# Patient Record
Sex: Female | Born: 1975 | Race: White | Hispanic: No | Marital: Married | Smoking: Never smoker
Health system: Southern US, Community
[De-identification: ages and names within clinical notes are randomized; demographics above are authoritative.]

## PROBLEM LIST (undated history)

## (undated) DIAGNOSIS — Z0000 Encounter for general adult medical examination without abnormal findings: Principal | ICD-10-CM

## (undated) DIAGNOSIS — B019 Varicella without complication: Secondary | ICD-10-CM

## (undated) DIAGNOSIS — K219 Gastro-esophageal reflux disease without esophagitis: Secondary | ICD-10-CM

## (undated) DIAGNOSIS — R739 Hyperglycemia, unspecified: Secondary | ICD-10-CM

## (undated) DIAGNOSIS — T7840XA Allergy, unspecified, initial encounter: Secondary | ICD-10-CM

## (undated) DIAGNOSIS — J45909 Unspecified asthma, uncomplicated: Secondary | ICD-10-CM

## (undated) DIAGNOSIS — G4700 Insomnia, unspecified: Secondary | ICD-10-CM

## (undated) DIAGNOSIS — E669 Obesity, unspecified: Secondary | ICD-10-CM

## (undated) DIAGNOSIS — E282 Polycystic ovarian syndrome: Secondary | ICD-10-CM

## (undated) DIAGNOSIS — I10 Essential (primary) hypertension: Secondary | ICD-10-CM

## (undated) DIAGNOSIS — E782 Mixed hyperlipidemia: Secondary | ICD-10-CM

## (undated) HISTORY — DX: Obesity, unspecified: E66.9

## (undated) HISTORY — DX: Insomnia, unspecified: G47.00

## (undated) HISTORY — DX: Mixed hyperlipidemia: E78.2

## (undated) HISTORY — PX: KNEE SURGERY: SHX244

## (undated) HISTORY — DX: Allergy, unspecified, initial encounter: T78.40XA

## (undated) HISTORY — DX: Unspecified asthma, uncomplicated: J45.909

## (undated) HISTORY — DX: Hyperglycemia, unspecified: R73.9

## (undated) HISTORY — DX: Polycystic ovarian syndrome: E28.2

## (undated) HISTORY — DX: Encounter for general adult medical examination without abnormal findings: Z00.00

## (undated) HISTORY — DX: Gastro-esophageal reflux disease without esophagitis: K21.9

## (undated) HISTORY — DX: Essential (primary) hypertension: I10

## (undated) HISTORY — DX: Varicella without complication: B01.9

## (undated) HISTORY — PX: ABDOMINAL HYSTERECTOMY: SHX81

---

## 1998-01-31 ENCOUNTER — Other Ambulatory Visit: Admission: RE | Admit: 1998-01-31 | Discharge: 1998-01-31 | Payer: Self-pay | Admitting: *Deleted

## 1998-11-02 ENCOUNTER — Encounter: Payer: Self-pay | Admitting: Specialist

## 1998-11-02 ENCOUNTER — Ambulatory Visit (HOSPITAL_COMMUNITY): Admission: RE | Admit: 1998-11-02 | Discharge: 1998-11-02 | Payer: Self-pay | Admitting: Specialist

## 1999-02-18 ENCOUNTER — Other Ambulatory Visit: Admission: RE | Admit: 1999-02-18 | Discharge: 1999-02-18 | Payer: Self-pay | Admitting: *Deleted

## 1999-04-24 ENCOUNTER — Encounter: Admission: RE | Admit: 1999-04-24 | Discharge: 1999-07-23 | Payer: Self-pay | Admitting: Specialist

## 1999-09-09 HISTORY — PX: WISDOM TOOTH EXTRACTION: SHX21

## 1999-10-27 ENCOUNTER — Ambulatory Visit (HOSPITAL_COMMUNITY): Admission: RE | Admit: 1999-10-27 | Discharge: 1999-10-27 | Payer: Self-pay | Admitting: Rheumatology

## 1999-10-27 ENCOUNTER — Encounter: Payer: Self-pay | Admitting: Rheumatology

## 2000-02-03 ENCOUNTER — Other Ambulatory Visit: Admission: RE | Admit: 2000-02-03 | Discharge: 2000-02-03 | Payer: Self-pay | Admitting: *Deleted

## 2001-02-18 ENCOUNTER — Encounter (INDEPENDENT_AMBULATORY_CARE_PROVIDER_SITE_OTHER): Payer: Self-pay

## 2001-02-18 ENCOUNTER — Other Ambulatory Visit: Admission: RE | Admit: 2001-02-18 | Discharge: 2001-02-18 | Payer: Self-pay | Admitting: *Deleted

## 2001-05-14 ENCOUNTER — Encounter: Payer: Self-pay | Admitting: Internal Medicine

## 2001-06-29 ENCOUNTER — Other Ambulatory Visit: Admission: RE | Admit: 2001-06-29 | Discharge: 2001-06-29 | Payer: Self-pay | Admitting: *Deleted

## 2002-01-20 ENCOUNTER — Other Ambulatory Visit: Admission: RE | Admit: 2002-01-20 | Discharge: 2002-01-20 | Payer: Self-pay | Admitting: *Deleted

## 2003-05-08 ENCOUNTER — Other Ambulatory Visit: Admission: RE | Admit: 2003-05-08 | Discharge: 2003-05-08 | Payer: Self-pay | Admitting: *Deleted

## 2004-11-11 ENCOUNTER — Ambulatory Visit: Payer: Self-pay | Admitting: Internal Medicine

## 2005-06-17 ENCOUNTER — Other Ambulatory Visit: Admission: RE | Admit: 2005-06-17 | Discharge: 2005-06-17 | Payer: Self-pay | Admitting: Obstetrics and Gynecology

## 2006-02-20 ENCOUNTER — Ambulatory Visit: Payer: Self-pay | Admitting: Internal Medicine

## 2006-06-23 ENCOUNTER — Other Ambulatory Visit: Admission: RE | Admit: 2006-06-23 | Discharge: 2006-06-23 | Payer: Self-pay | Admitting: Obstetrics & Gynecology

## 2006-06-25 ENCOUNTER — Ambulatory Visit (HOSPITAL_COMMUNITY): Admission: RE | Admit: 2006-06-25 | Discharge: 2006-06-25 | Payer: Self-pay | Admitting: Specialist

## 2007-01-13 ENCOUNTER — Ambulatory Visit: Payer: Self-pay | Admitting: Internal Medicine

## 2007-08-04 ENCOUNTER — Other Ambulatory Visit: Admission: RE | Admit: 2007-08-04 | Discharge: 2007-08-04 | Payer: Self-pay | Admitting: Obstetrics and Gynecology

## 2007-10-22 DIAGNOSIS — N764 Abscess of vulva: Secondary | ICD-10-CM

## 2007-10-23 ENCOUNTER — Ambulatory Visit: Payer: Self-pay | Admitting: Family Medicine

## 2007-10-25 ENCOUNTER — Telehealth: Payer: Self-pay | Admitting: Family Medicine

## 2007-11-04 ENCOUNTER — Ambulatory Visit: Payer: Self-pay | Admitting: Internal Medicine

## 2007-11-04 DIAGNOSIS — F988 Other specified behavioral and emotional disorders with onset usually occurring in childhood and adolescence: Secondary | ICD-10-CM

## 2007-11-04 DIAGNOSIS — I495 Sick sinus syndrome: Secondary | ICD-10-CM | POA: Insufficient documentation

## 2007-11-15 ENCOUNTER — Telehealth (INDEPENDENT_AMBULATORY_CARE_PROVIDER_SITE_OTHER): Payer: Self-pay | Admitting: *Deleted

## 2007-11-16 ENCOUNTER — Encounter: Payer: Self-pay | Admitting: Internal Medicine

## 2008-10-03 ENCOUNTER — Telehealth: Payer: Self-pay | Admitting: Internal Medicine

## 2008-10-05 ENCOUNTER — Other Ambulatory Visit: Admission: RE | Admit: 2008-10-05 | Discharge: 2008-10-05 | Payer: Self-pay | Admitting: Obstetrics and Gynecology

## 2008-12-18 ENCOUNTER — Ambulatory Visit: Payer: Self-pay | Admitting: Family Medicine

## 2008-12-18 DIAGNOSIS — T7840XA Allergy, unspecified, initial encounter: Secondary | ICD-10-CM | POA: Insufficient documentation

## 2008-12-18 HISTORY — DX: Allergy, unspecified, initial encounter: T78.40XA

## 2009-04-03 ENCOUNTER — Ambulatory Visit: Payer: Self-pay | Admitting: Internal Medicine

## 2009-04-03 DIAGNOSIS — R55 Syncope and collapse: Secondary | ICD-10-CM

## 2009-04-03 DIAGNOSIS — R011 Cardiac murmur, unspecified: Secondary | ICD-10-CM

## 2009-04-03 DIAGNOSIS — R030 Elevated blood-pressure reading, without diagnosis of hypertension: Secondary | ICD-10-CM | POA: Insufficient documentation

## 2009-04-22 ENCOUNTER — Encounter: Payer: Self-pay | Admitting: Internal Medicine

## 2009-04-22 DIAGNOSIS — R42 Dizziness and giddiness: Secondary | ICD-10-CM

## 2009-04-22 DIAGNOSIS — Q078 Other specified congenital malformations of nervous system: Secondary | ICD-10-CM

## 2009-04-23 ENCOUNTER — Encounter: Payer: Self-pay | Admitting: Internal Medicine

## 2009-05-04 ENCOUNTER — Ambulatory Visit: Payer: Self-pay | Admitting: Internal Medicine

## 2009-05-04 DIAGNOSIS — Z9189 Other specified personal risk factors, not elsewhere classified: Secondary | ICD-10-CM | POA: Insufficient documentation

## 2009-05-16 ENCOUNTER — Encounter: Payer: Self-pay | Admitting: Internal Medicine

## 2009-05-16 ENCOUNTER — Ambulatory Visit: Payer: Self-pay

## 2009-05-21 ENCOUNTER — Telehealth: Payer: Self-pay | Admitting: Internal Medicine

## 2009-05-30 ENCOUNTER — Encounter: Payer: Self-pay | Admitting: Internal Medicine

## 2009-05-30 ENCOUNTER — Ambulatory Visit (HOSPITAL_BASED_OUTPATIENT_CLINIC_OR_DEPARTMENT_OTHER): Admission: RE | Admit: 2009-05-30 | Discharge: 2009-05-30 | Payer: Self-pay | Admitting: Internal Medicine

## 2009-06-03 ENCOUNTER — Ambulatory Visit: Payer: Self-pay | Admitting: Pulmonary Disease

## 2009-06-05 ENCOUNTER — Telehealth (INDEPENDENT_AMBULATORY_CARE_PROVIDER_SITE_OTHER): Payer: Self-pay | Admitting: *Deleted

## 2009-06-08 ENCOUNTER — Ambulatory Visit: Payer: Self-pay | Admitting: Internal Medicine

## 2009-06-09 LAB — CONVERTED CEMR LAB
ALT: 22 units/L (ref 0–35)
AST: 21 units/L (ref 0–37)
BUN: 17 mg/dL (ref 6–23)
CO2: 30 meq/L (ref 19–32)
Calcium: 9.2 mg/dL (ref 8.4–10.5)
Chloride: 106 meq/L (ref 96–112)
Creatinine, Ser: 0.7 mg/dL (ref 0.4–1.2)
GFR calc non Af Amer: 102.24 mL/min (ref >60)
Glucose, Bld: 90 mg/dL (ref 70–99)
Potassium: 4.2 meq/L (ref 3.5–5.1)
Sodium: 140 meq/L (ref 135–145)

## 2009-06-11 ENCOUNTER — Ambulatory Visit: Payer: Self-pay | Admitting: Internal Medicine

## 2009-06-11 DIAGNOSIS — R609 Edema, unspecified: Secondary | ICD-10-CM | POA: Insufficient documentation

## 2009-12-17 ENCOUNTER — Telehealth (INDEPENDENT_AMBULATORY_CARE_PROVIDER_SITE_OTHER): Payer: Self-pay | Admitting: *Deleted

## 2009-12-17 ENCOUNTER — Encounter: Payer: Self-pay | Admitting: Cardiovascular Disease

## 2010-06-28 ENCOUNTER — Ambulatory Visit: Payer: Self-pay | Admitting: Internal Medicine

## 2010-09-13 ENCOUNTER — Ambulatory Visit
Admission: RE | Admit: 2010-09-13 | Discharge: 2010-09-13 | Payer: Self-pay | Source: Home / Self Care | Attending: Internal Medicine | Admitting: Internal Medicine

## 2010-09-13 DIAGNOSIS — J45901 Unspecified asthma with (acute) exacerbation: Secondary | ICD-10-CM | POA: Insufficient documentation

## 2010-10-06 LAB — CONVERTED CEMR LAB
BUN: 12 mg/dL (ref 6–23)
Basophils Absolute: 0 10*3/uL (ref 0.0–0.1)
Basophils Relative: 0.7 % (ref 0.0–3.0)
CO2: 29 meq/L (ref 19–32)
Calcium: 9.5 mg/dL (ref 8.4–10.5)
Chloride: 104 meq/L (ref 96–112)
Creatinine, Ser: 0.7 mg/dL (ref 0.4–1.2)
Eosinophils Absolute: 0.2 10*3/uL (ref 0.0–0.7)
Eosinophils Relative: 3.2 % (ref 0.0–5.0)
GFR calc non Af Amer: 108.73 mL/min (ref >60)
Glucose, Bld: 82 mg/dL (ref 70–99)
HCT: 37.6 % (ref 36.0–46.0)
Hemoglobin: 13 g/dL (ref 12.0–15.0)
Lymphocytes Relative: 26.7 % (ref 12.0–46.0)
Lymphs Abs: 1.8 10*3/uL (ref 0.7–4.0)
MCHC: 34.6 g/dL (ref 30.0–36.0)
MCV: 85.9 fL (ref 78.0–100.0)
Monocytes Absolute: 0.5 10*3/uL (ref 0.1–1.0)
Monocytes Relative: 7.7 % (ref 3.0–12.0)
Neutro Abs: 4.1 10*3/uL (ref 1.4–7.7)
Neutrophils Relative %: 61.7 % (ref 43.0–77.0)
Platelets: 296 10*3/uL (ref 150.0–400.0)
Potassium: 4.5 meq/L (ref 3.5–5.1)
RBC: 4.37 M/uL (ref 3.87–5.11)
RDW: 13.2 % (ref 11.5–14.6)
Sodium: 139 meq/L (ref 135–145)
WBC: 6.7 10*3/uL (ref 4.5–10.5)

## 2010-10-08 NOTE — Medication Information (Signed)
Summary: Physician Orders  Physician Orders   Imported By: Roderic Ovens 12/27/2009 11:31:32  _____________________________________________________________________  External Attachment:    Type:   Image     Comment:   External Document

## 2010-10-08 NOTE — Progress Notes (Signed)
  Phone Note From Other Clinic   Caller: Provider Summary of Call: PT IN CHAIR AT DENTIST OFFICE WITH EMERGENCY  NEEDS NOTE SAYING WHETHER  PT NEEDS PRE MED PER NEW GUIDELINES NOT REQUIRED PER DR MCALHANY.FAXED NOTE TO DR Marchia Bond.  Initial call taken by: Scherrie Bateman, LPN,  December 17, 2009 2:46 PM

## 2010-10-08 NOTE — Assessment & Plan Note (Signed)
Summary: ROV./ GD   Visit Type:  Follow-up Primary Provider:  Dr.William Hopper   History of Present Illness: The patient presents today for routine electrophysiology followup. She reports doing very well since last being seen in our clinic. She has had no further syncope or presyncope.  She reports occasional dizziness which is mild with prolonged standing.  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  The patient denies symptoms of palpitations, chest pain, shortness of breath, orthopnea, PND, lower extremity edema,  or neurologic sequela. The patient is tolerating medications without difficulties and is otherwise without complaint today.   Current Medications (verified): 1)  Proventil Hfa 108 (90 Base) Mcg/act Aers (Albuterol Sulfate) .... Use 4 Times Daily As Needed  Allergies: 1)  ! Pcn 2)  ! * Influenza Vaccine 3)  ! Latex Exam Gloves (Disposable Gloves)  Past History:  Past Medical History: Reviewed history from 05/04/2009 and no changes required. Neurally mediated syncope/ dysautonomia Hypertension RHINITIS (ICD-477.9) Sinus arrhythmia DJD Asthma  Past Surgical History: Reviewed history from 11/04/2007 and no changes required. Arthroscopy X2  Social History: Reviewed history from 05/04/2009 and no changes required. Single.  Lives in [place] alone. Domestic Partner Never Smoked Alcohol use-rare Drug use-no Works as a Web designer at [employer].  She also works as a Agricultural engineer.  Review of Systems       All systems are reviewed and negative except as listed in the HPI.   Vital Signs:  Patient profile:   35 year old female Height:      58 inches Weight:      184 pounds BMI:     38.60 Pulse rate:   84 / minute Pulse (ortho):   87 / minute BP sitting:   144 / 90  (left arm) BP standing:   121 / 83  Vitals Entered By: Laurance Flatten CMA (June 28, 2010 9:55 AM)  Serial Vital  Signs/Assessments:  Time      Position  BP       Pulse  Resp  Temp     By           Lying RA  114/76   76                    Jewel Hardy CMA           Sitting   130/89   88                    Jewel Hardy CMA           Standing  121/83   87                    Jewel Hardy CMA           Standing  110/77   80                    Jewel Hardy CMA           Standing  123/82   78                    Jewel Hardy CMA  Comments: Dizziness during readings 2 and 3 By: Laurance Flatten CMA    Physical Exam  General:  overweight, NAD Head:  normocephalic and atraumatic Eyes:  PERRLA/EOM intact; conjunctiva and lids normal. Mouth:  Teeth, gums  and palate normal. Oral mucosa normal. Neck:  Neck supple, no JVD. No masses, thyromegaly or abnormal cervical nodes. Lungs:  Clear bilaterally to auscultation and percussion. Heart:  Non-displaced PMI, chest non-tender; regular rate and rhythm, S1, S2 without murmurs, rubs or gallops. Carotid upstroke normal, no bruit. Normal abdominal aortic size, no bruits. Femorals normal pulses, no bruits. Pedals normal pulses. No edema, no varicosities. Abdomen:  Bowel sounds positive; abdomen soft and non-tender without masses, organomegaly, or hernias noted. No hepatosplenomegaly. Msk:  Back normal, normal gait. Muscle strength and tone normal. Pulses:  pulses normal in all 4 extremities Extremities:  No clubbing or cyanosis. Neurologic:  Alert and oriented x 3.   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 If you don't receive a letter, please call our office to schedule the follow-up appointment. 2)  Your physician recommends that you return for lab work today

## 2010-10-10 NOTE — Assessment & Plan Note (Signed)
Summary: HEAD CONGESTION,COUGH/RH.........Veronica Stokes   Vital Signs:  Patient profile:   35 year old female Weight:      186 pounds BMI:     39.01 Temp:     98.1 degrees F oral Pulse rate:   88 / minute Resp:     15 per minute BP sitting:   122 / 86  (left arm) Cuff size:   large  Vitals Entered By: Shonna Chock CMA (September 13, 2010 2:11 PM) CC: 1.) Cough and congestion  2.) Examine eyes, URI symptoms   Primary Care Provider:  Dr.Simar Pothier  CC:  1.) Cough and congestion  2.) Examine eyes and URI symptoms.  History of Present Illness: Onset 09/04/2010 as discharge from OD & wheezing while in [place]. ZO:[id number] MDI , Zyrtec,topical antihistamine.OD redness  resolved in < 4 days.OS redness this am; it has  responded to eyedrops. The patient reports nasal congestion, semi  purulent nasal discharge, and productive cough, but denies sore throat and earache.  Associated symptoms include dyspnea and wheezing.  The patient denies fever.  The patient also reports headache.  The patient denies the following risk factors for Strep sinusitis: unilateral nasal discharge, tooth pain, and tender adenopathy.    Current Medications (verified): 1)  Proventil Hfa 108 (90 Base) Mcg/act Aers (Albuterol Sulfate) .... Use 4 Times Daily As Needed 2)  Imidazyl .... 2 Drops in Infected Eye Three Times A Day  Allergies: 1)  ! Pcn 2)  ! * Influenza Vaccine 3)  ! Latex Exam Gloves (Disposable Gloves)  Physical Exam  General:  in no acute distress; alert,appropriate and cooperative throughout examination Eyes:  No corneal or conjunctival inflammation noted.Scleritis OS. EOMI. Perrla.  Vision grossly normal. Ears:  External ear exam shows no significant lesions or deformities.  Otoscopic examination reveals clear canals, tympanic membranes are intact bilaterally without bulging, retraction, inflammation or discharge. Hearing is grossly normal bilaterally. Nose:  External nasal examination shows no deformity or  inflammation. Nasal mucosa are  dry & erythematous  without lesions or exudates. Mouth:  Oral mucosa and oropharynx without lesions or exudates.  Teeth in good repair. Lungs:  Normal respiratory effort, chest expands symmetrically. Lungs are clear to auscultation, no crackles or wheezes. Cervical Nodes:  No lymphadenopathy noted Axillary Nodes:  No palpable lymphadenopathy   Impression & Recommendations:  Problem # 1:  ASTHMA NOS W/ACUTE EXACERBATION (ICD-493.92)  The following medications were removed from the medication list:    Proventil Hfa 108 (90 Base) Mcg/act Aers (Albuterol sulfate) ..... Use 4 times daily as needed Her updated medication list for this problem includes:    Ventolin Hfa 108 (90 Base) Mcg/act Aers (Albuterol sulfate) .Veronica Stokes... 1-2 puffs every 4 hrs as needed    Symbicort 160-4.5 Mcg/act Aero (Budesonide-formoterol fumarate) .Veronica Stokes... 1-2 puffs every 12 hrs ; gargle & spit after use  Problem # 2:  BRONCHITIS-ACUTE (ICD-466.0)  The following medications were removed from the medication list:    Proventil Hfa 108 (90 Base) Mcg/act Aers (Albuterol sulfate) ..... Use 4 times daily as needed Her updated medication list for this problem includes:    Clarithromycin 500 Mg Xr24h-tab (Clarithromycin) .Veronica Stokes... 2 once daily with a meal    Ventolin Hfa 108 (90 Base) Mcg/act Aers (Albuterol sulfate) .Veronica Stokes... 1-2 puffs every 4 hrs as needed    Symbicort 160-4.5 Mcg/act Aero (Budesonide-formoterol fumarate) .Veronica Stokes... 1-2 puffs every 12 hrs ; gargle & spit after use  Problem # 3:  CONJUNCTIVITIS (ICD-372.30) extrinsic ; clinically no infectious  conjunctivitis  Complete Medication List: 1)  Imidazyl  .... 2 drops in infected eye three times a day 2)  Clarithromycin 500 Mg Xr24h-tab (Clarithromycin) .... 2 once daily with a meal 3)  Ventolin Hfa 108 (90 Base) Mcg/act Aers (Albuterol sulfate) .Veronica Stokes.. 1-2 puffs every 4 hrs as needed 4)  Symbicort 160-4.5 Mcg/act Aero (Budesonide-formoterol fumarate)  .Veronica Stokes.. 1-2 puffs every 12 hrs ; gargle & spit after use  Patient Instructions: 1)  Report Warning Signs as discussed (pain , pus , fever, loss of vision). Strict Hand Hygiene. Prescriptions: SYMBICORT 160-4.5 MCG/ACT AERO (BUDESONIDE-FORMOTEROL FUMARATE) 1-2 puffs every 12 hrs ; gargle & spit after use  #1 x 5   Entered and Authorized by:   Marga Melnick MD   Signed by:   Marga Melnick MD on 09/13/2010   Method used:   Print then Give to Patient   RxID:   [id number] VENTOLIN HFA 108 (90 BASE) MCG/ACT AERS (ALBUTEROL SULFATE) 1-2 puffs every 4 hrs as needed  #1 x 2   Entered and Authorized by:   Marga Melnick MD   Signed by:   Marga Melnick MD on 09/13/2010   Method used:   Print then Give to Patient   RxID:   [id number] CLARITHROMYCIN 500 MG XR24H-TAB (CLARITHROMYCIN) 2 once daily with a meal  #20 x 0   Entered and Authorized by:   Marga Melnick MD   Signed by:   Marga Melnick MD on 09/13/2010   Method used:   Electronically to        [facility]  [address] [phone]* (retail)       [address]       [place], [place]  [address]       Ph: [phone] or [phone]       Fax: [phone]   RxID:   [id number]    Orders Added: 1)  Est. Patient Level III [60630]

## 2011-01-24 NOTE — Op Note (Signed)
[name], [name]            ACCOUNT NO.:  [id number]   MEDICAL RECORD NO.:  [id number]          PATIENT TYPE:  AMB   LOCATION:  DAY                          FACILITY[facility]  [facility]   PHYSICIAN:  Jene Every, M.D.    DATE OF BIRTH:  07/22/1976   DATE OF PROCEDURE:  06/25/2006  DATE OF DISCHARGE:                                 OPERATIVE REPORT   PREOPERATIVE DIAGNOSIS:  Meniscal tear, left knee.   POSTOPERATIVE DIAGNOSIS:  Lateral meniscus tear, grade 2 chondromalacia of  the patella, pathologic plica, medial compartment.   PROCEDURE PERFORMED:  Left knee arthroscopy, chondroplasty of the patella,  partial lateral meniscectomy, and excision of pathologic plica medial  compartment.   ANESTHESIA:  General.   SURGEON:  Jene Every, M.D.   ASSISTANT:  None.   BRIEF HISTORY AND INDICATIONS:  35 year old with knee pain, refractory,  history of trauma, partial ACL tear, persistent medial pain, giving way,  prevention is indicated for diagnosis and treatment.  The risks and benefits  were discussed including bleeding, infection, no change in symptoms,  worsening of symptoms, need for repeat debridement in the future.   SURGICAL TECHNIQUE:  With the patient in the supine position, after the  induction of adequate of adequate general anesthesia and 1 gram Kefzol, the  left lower extremity was prepped and draped in the usual sterile fashion.  A  lateral parapatellar portal and superomedial parapatellar portal was  fashioned with a #11 blade.  Ingress cannula atraumatically placed.  Irrigant was utilized to insufflate the joint.  Under direct visualization,  a medial parapatellar portal was fashioned with a #11 blade after  localization with an 18 gauge needle sparing the medial meniscus.  Inspection revealed grade 2 3 chondromalacia of the apex of the patella.  There was normal tracking.  There was medial plica where the femoral condyle  appeared to be snapping pathologic.  I  introduced the shaver and utilized it  to perform a chondroplasty of the patella, excised the plica.  I examined  the medial compartment, there was no tear.  The femoral condyle and tibial  plateau were unremarkable.  The ACL appeared to be intact to the lateral  wall but slightly attenuated.  The anterior drawer was 1+ with a good end  point.  Examination of the lateral compartment revealed a radial tear of the  posterior horn of the medial meniscus.  A shaver was introduced and utilized  to perform a partial lateral meniscectomy to a stable base.  The chondral  surfaces are unremarkable.  The gutters are unremarkable.  The knee was  copiously lavaged.  I examined the remnants of both menisci, they were  stable to palpation without evidence of residual tear.  There was normal  patellofemoral tracking, no impingement of the soft tissue.  All  instrumentation was removed.  The portals were closed with 4-0 nylon simple  suture.  0.25% Marcaine  with epinephrine was infiltrated in the joint.  The wound was dressed  sterilely.  She was awakened without difficulty and transported to the  recovery room in satisfactory condition.  The patient tolerated  the  procedure well without complications.      Jene Every, M.D.  Electronically Signed     JB/MEDQ  D:  06/25/2006  T:  06/27/2006  Job:  [id number]

## 2011-04-09 DIAGNOSIS — E282 Polycystic ovarian syndrome: Secondary | ICD-10-CM

## 2011-04-09 HISTORY — DX: Polycystic ovarian syndrome: E28.2

## 2012-03-08 ENCOUNTER — Encounter: Payer: Self-pay | Admitting: Internal Medicine

## 2012-03-08 ENCOUNTER — Ambulatory Visit (INDEPENDENT_AMBULATORY_CARE_PROVIDER_SITE_OTHER): Payer: BC Managed Care – PPO | Admitting: Internal Medicine

## 2012-03-08 VITALS — BP 118/76 | HR 92 | Temp 98.2°F | Wt 191.8 lb

## 2012-03-08 DIAGNOSIS — L02419 Cutaneous abscess of limb, unspecified: Secondary | ICD-10-CM

## 2012-03-08 DIAGNOSIS — L03119 Cellulitis of unspecified part of limb: Secondary | ICD-10-CM

## 2012-03-08 MED ORDER — DOXYCYCLINE HYCLATE 100 MG PO TABS: 100.0000 mg | ORAL_TABLET | Freq: Two times a day (BID) | ORAL | Status: AC

## 2012-03-08 NOTE — Progress Notes (Signed)
  Subjective:    Patient ID: Veronica Stokes, female    DOB: [birth year], 36 y.o.   MRN: [id number]  HPI She noticed a "red spot" at the left lateral knee on 03/06/12. She had been working in yard and also had cleaned out sheds; she questioned possible spider bite  As of 6/30 the  redness increased in size and was associated with some clear serous drainage from the lesion.  The lesion has been associated with some itching and burning.  She's treated the lesion with alcohol cleansing and applications of Neosporin  She has a history of MRSA boils.    Review of Systems She's had no associated purulence, fever, chills, or sweats. She's had some nausea which he attributes to fertility treatments. She's had no associated abdominal pain or vomiting.      Objective:   Physical Exam she appears healthy and well-nourished in no acute distress  There is no lymphadenopathy about the neck or axilla  Bowel sounds are normal; there is no organomegaly, masses, tenderness to palpation  There is an area of 15 x 21 mm erythema of the left lateral knee. There is a central eschar without ulceration  Pedal pulses are intact without significant edema          Assessment & Plan:  #1 cellulitis at the area of probable mosquito bite. In the absence of frank ulceration, high fever, abdominal pain, nausea and vomiting; spider bite is unlikely.  Plan: Doxycycline to cover MRSA and treat cellulitis. She should stop the Neosporin as there is high risk of contact dermatitis

## 2012-03-08 NOTE — Patient Instructions (Addendum)
Dip gauze in  sterile saline and applied to the wound twice a day. Cover the wound with Telfa , non stick dressing  without any antibiotic ointment. The saline can be purchased at the drugstore or you can make your own .Boil cup of salt in a gallon of water. Store mixture  in a clean container.Report Warning  signs as discussed (red streaks, pus, fever, increasing pain). 

## 2012-04-08 LAB — HM PAP SMEAR

## 2012-08-04 ENCOUNTER — Encounter: Payer: Self-pay | Admitting: Family Medicine

## 2012-08-04 ENCOUNTER — Ambulatory Visit (INDEPENDENT_AMBULATORY_CARE_PROVIDER_SITE_OTHER): Payer: BC Managed Care – PPO | Admitting: Family Medicine

## 2012-08-04 VITALS — BP 145/87 | HR 79 | Temp 98.4°F | Ht 59.0 in | Wt 199.8 lb

## 2012-08-04 DIAGNOSIS — N764 Abscess of vulva: Secondary | ICD-10-CM

## 2012-08-04 DIAGNOSIS — I495 Sick sinus syndrome: Secondary | ICD-10-CM

## 2012-08-04 DIAGNOSIS — R030 Elevated blood-pressure reading, without diagnosis of hypertension: Secondary | ICD-10-CM

## 2012-08-04 DIAGNOSIS — E282 Polycystic ovarian syndrome: Secondary | ICD-10-CM

## 2012-08-04 DIAGNOSIS — Z23 Encounter for immunization: Secondary | ICD-10-CM

## 2012-08-04 DIAGNOSIS — J45909 Unspecified asthma, uncomplicated: Secondary | ICD-10-CM

## 2012-08-04 DIAGNOSIS — F988 Other specified behavioral and emotional disorders with onset usually occurring in childhood and adolescence: Secondary | ICD-10-CM

## 2012-08-04 DIAGNOSIS — R609 Edema, unspecified: Secondary | ICD-10-CM

## 2012-08-04 MED ORDER — ALBUTEROL SULFATE HFA 108 (90 BASE) MCG/ACT IN AERS: 2.0000 | INHALATION_SPRAY | Freq: Four times a day (QID) | RESPIRATORY_TRACT | Status: DC | PRN

## 2012-08-04 NOTE — Assessment & Plan Note (Addendum)
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 Failed IUI and IVF has not chosen try this. Tolerating Metformin

## 2012-08-04 NOTE — Assessment & Plan Note (Signed)
Previously on meds. Doing well without them.

## 2012-08-04 NOTE — Patient Instructions (Signed)
Preventive Care for Adults, Female A healthy lifestyle and preventive care can promote health and wellness. Preventive health guidelines for women include the following key practices.  A routine yearly physical is a good way to check with your caregiver about your health and preventive screening. It is a chance to share any concerns and updates on your health, and to receive a thorough exam.  Visit your dentist for a routine exam and preventive care every 6 months. Brush your teeth twice a day and floss once a day. Good oral hygiene prevents tooth decay and gum disease.  The frequency of eye exams is based on your age, health, family medical history, use of contact lenses, and other factors. Follow your caregiver's recommendations for frequency of eye exams.  Eat a healthy diet. Foods like vegetables, fruits, whole grains, low-fat dairy products, and lean protein foods contain the nutrients you need without too many calories. Decrease your intake of foods high in solid fats, added sugars, and salt. Eat the right amount of calories for you.Get information about a proper diet from your caregiver, if necessary.  Regular physical exercise is one of the most important things you can do for your health. Most adults should get at least 150 minutes of moderate-intensity exercise (any activity that increases your heart rate and causes you to sweat) each week. In addition, most adults need muscle-strengthening exercises on 2 or more days a week.  Maintain a healthy weight. The body mass index (BMI) is a screening tool to identify possible weight problems. It provides an estimate of body fat based on height and weight. Your caregiver can help determine your BMI, and can help you achieve or maintain a healthy weight.For adults 20 years and older:  A BMI below 18.5 is considered underweight.  A BMI of 18.5 to 24.9 is normal.  A BMI of 25 to 29.9 is considered overweight.  A BMI of 30 and above is  considered obese.  Maintain normal blood lipids and cholesterol levels by exercising and minimizing your intake of saturated fat. Eat a balanced diet with plenty of fruit and vegetables. Blood tests for lipids and cholesterol should begin at age 20 and be repeated every 5 years. If your lipid or cholesterol levels are high, you are over 50, or you are at high risk for heart disease, you may need your cholesterol levels checked more frequently.Ongoing high lipid and cholesterol levels should be treated with medicines if diet and exercise are not effective.  If you smoke, find out from your caregiver how to quit. If you do not use tobacco, do not start.  If you are pregnant, do not drink alcohol. If you are breastfeeding, be very cautious about drinking alcohol. If you are not pregnant and choose to drink alcohol, do not exceed 1 drink per day. One drink is considered to be 12 ounces (355 mL) of beer, 5 ounces (148 mL) of wine, or 1.5 ounces (44 mL) of liquor.  Avoid use of street drugs. Do not share needles with anyone. Ask for help if you need support or instructions about stopping the use of drugs.  High blood pressure causes heart disease and increases the risk of stroke. Your blood pressure should be checked at least every 1 to 2 years. Ongoing high blood pressure should be treated with medicines if weight loss and exercise are not effective.  If you are 55 to 36 years old, ask your caregiver if you should take aspirin to prevent strokes.  Diabetes   screening involves taking a blood sample to check your fasting blood sugar level. This should be done once every 3 years, after age 45, if you are within normal weight and without risk factors for diabetes. Testing should be considered at a younger age or be carried out more frequently if you are overweight and have at least 1 risk factor for diabetes.  Breast cancer screening is essential preventive care for women. You should practice "breast  self-awareness." This means understanding the normal appearance and feel of your breasts and may include breast self-examination. Any changes detected, no matter how small, should be reported to a caregiver. Women in their 20s and 30s should have a clinical breast exam (CBE) by a caregiver as part of a regular health exam every 1 to 3 years. After age 40, women should have a CBE every year. Starting at age 40, women should consider having a mammography (breast X-ray test) every year. Women who have a family history of breast cancer should talk to their caregiver about genetic screening. Women at a high risk of breast cancer should talk to their caregivers about having magnetic resonance imaging (MRI) and a mammography every year.  The Pap test is a screening test for cervical cancer. A Pap test can show cell changes on the cervix that might become cervical cancer if left untreated. A Pap test is a procedure in which cells are obtained and examined from the lower end of the uterus (cervix).  Women should have a Pap test starting at age 21.  Between ages 21 and 29, Pap tests should be repeated every 2 years.  Beginning at age 30, you should have a Pap test every 3 years as long as the past 3 Pap tests have been normal.  Some women have medical problems that increase the chance of getting cervical cancer. Talk to your caregiver about these problems. It is especially important to talk to your caregiver if a new problem develops soon after your last Pap test. In these cases, your caregiver may recommend more frequent screening and Pap tests.  The above recommendations are the same for women who have or have not gotten the vaccine for human papillomavirus (HPV).  If you had a hysterectomy for a problem that was not cancer or a condition that could lead to cancer, then you no longer need Pap tests. Even if you no longer need a Pap test, a regular exam is a good idea to make sure no other problems are  starting.  If you are between ages 65 and 70, and you have had normal Pap tests going back 10 years, you no longer need Pap tests. Even if you no longer need a Pap test, a regular exam is a good idea to make sure no other problems are starting.  If you have had past treatment for cervical cancer or a condition that could lead to cancer, you need Pap tests and screening for cancer for at least 20 years after your treatment.  If Pap tests have been discontinued, risk factors (such as a new sexual partner) need to be reassessed to determine if screening should be resumed.  The HPV test is an additional test that may be used for cervical cancer screening. The HPV test looks for the virus that can cause the cell changes on the cervix. The cells collected during the Pap test can be tested for HPV. The HPV test could be used to screen women aged 30 years and older, and should   be used in women of any age who have unclear Pap test results. After the age of 30, women should have HPV testing at the same frequency as a Pap test.  Colorectal cancer can be detected and often prevented. Most routine colorectal cancer screening begins at the age of 50 and continues through age 75. However, your caregiver may recommend screening at an earlier age if you have risk factors for colon cancer. On a yearly basis, your caregiver may provide home test kits to check for hidden blood in the stool. Use of a small camera at the end of a tube, to directly examine the colon (sigmoidoscopy or colonoscopy), can detect the earliest forms of colorectal cancer. Talk to your caregiver about this at age 50, when routine screening begins. Direct examination of the colon should be repeated every 5 to 10 years through age 75, unless early forms of pre-cancerous polyps or small growths are found.  Hepatitis C blood testing is recommended for all people born from 1945 through 1965 and any individual with known risks for hepatitis C.  Practice  safe sex. Use condoms and avoid high-risk sexual practices to reduce the spread of sexually transmitted infections (STIs). STIs include gonorrhea, chlamydia, syphilis, trichomonas, herpes, HPV, and human immunodeficiency virus (HIV). Herpes, HIV, and HPV are viral illnesses that have no cure. They can result in disability, cancer, and death. Sexually active women aged 25 and younger should be checked for chlamydia. Older women with new or multiple partners should also be tested for chlamydia. Testing for other STIs is recommended if you are sexually active and at increased risk.  Osteoporosis is a disease in which the bones lose minerals and strength with aging. This can result in serious bone fractures. The risk of osteoporosis can be identified using a bone density scan. Women ages 65 and over and women at risk for fractures or osteoporosis should discuss screening with their caregivers. Ask your caregiver whether you should take a calcium supplement or vitamin D to reduce the rate of osteoporosis.  Menopause can be associated with physical symptoms and risks. Hormone replacement therapy is available to decrease symptoms and risks. You should talk to your caregiver about whether hormone replacement therapy is right for you.  Use sunscreen with sun protection factor (SPF) of 30 or more. Apply sunscreen liberally and repeatedly throughout the day. You should seek shade when your shadow is shorter than you. Protect yourself by wearing long sleeves, pants, a wide-brimmed hat, and sunglasses year round, whenever you are outdoors.  Once a month, do a whole body skin exam, using a mirror to look at the skin on your back. Notify your caregiver of new moles, moles that have irregular borders, moles that are larger than a pencil eraser, or moles that have changed in shape or color.  Stay current with required immunizations.  Influenza. You need a dose every fall (or winter). The composition of the flu vaccine  changes each year, so being vaccinated once is not enough.  Pneumococcal polysaccharide. You need 1 to 2 doses if you smoke cigarettes or if you have certain chronic medical conditions. You need 1 dose at age 65 (or older) if you have never been vaccinated.  Tetanus, diphtheria, pertussis (Tdap, Td). Get 1 dose of Tdap vaccine if you are younger than age 65, are over 65 and have contact with an infant, are a healthcare worker, are pregnant, or simply want to be protected from whooping cough. After that, you need a Td   booster dose every 10 years. Consult your caregiver if you have not had at least 3 tetanus and diphtheria-containing shots sometime in your life or have a deep or dirty wound.  HPV. You need this vaccine if you are a woman age 26 or younger. The vaccine is given in 3 doses over 6 months.  Measles, mumps, rubella (MMR). You need at least 1 dose of MMR if you were born in 1957 or later. You may also need a second dose.  Meningococcal. If you are age 19 to 21 and a first-year college student living in a residence hall, or have one of several medical conditions, you need to get vaccinated against meningococcal disease. You may also need additional booster doses.  Zoster (shingles). If you are age 60 or older, you should get this vaccine.  Varicella (chickenpox). If you have never had chickenpox or you were vaccinated but received only 1 dose, talk to your caregiver to find out if you need this vaccine.  Hepatitis A. You need this vaccine if you have a specific risk factor for hepatitis A virus infection or you simply wish to be protected from this disease. The vaccine is usually given as 2 doses, 6 to 18 months apart.  Hepatitis B. You need this vaccine if you have a specific risk factor for hepatitis B virus infection or you simply wish to be protected from this disease. The vaccine is given in 3 doses, usually over 6 months. Preventive Services / Frequency Ages 19 to 39  Blood  pressure check.** / Every 1 to 2 years.  Lipid and cholesterol check.** / Every 5 years beginning at age 20.  Clinical breast exam.** / Every 3 years for women in their 20s and 30s.  Pap test.** / Every 2 years from ages 21 through 29. Every 3 years starting at age 30 through age 65 or 70 with a history of 3 consecutive normal Pap tests.  HPV screening.** / Every 3 years from ages 30 through ages 65 to 70 with a history of 3 consecutive normal Pap tests.  Hepatitis C blood test.** / For any individual with known risks for hepatitis C.  Skin self-exam. / Monthly.  Influenza immunization.** / Every year.  Pneumococcal polysaccharide immunization.** / 1 to 2 doses if you smoke cigarettes or if you have certain chronic medical conditions.  Tetanus, diphtheria, pertussis (Tdap, Td) immunization. / A one-time dose of Tdap vaccine. After that, you need a Td booster dose every 10 years.  HPV immunization. / 3 doses over 6 months, if you are 26 and younger.  Measles, mumps, rubella (MMR) immunization. / You need at least 1 dose of MMR if you were born in 1957 or later. You may also need a second dose.  Meningococcal immunization. / 1 dose if you are age 19 to 21 and a first-year college student living in a residence hall, or have one of several medical conditions, you need to get vaccinated against meningococcal disease. You may also need additional booster doses.  Varicella immunization.** / Consult your caregiver.  Hepatitis A immunization.** / Consult your caregiver. 2 doses, 6 to 18 months apart.  Hepatitis B immunization.** / Consult your caregiver. 3 doses usually over 6 months. Ages 40 to 64  Blood pressure check.** / Every 1 to 2 years.  Lipid and cholesterol check.** / Every 5 years beginning at age 20.  Clinical breast exam.** / Every year after age 40.  Mammogram.** / Every year beginning at age 40   and continuing for as long as you are in good health. Consult with your  caregiver.  Pap test.** / Every 3 years starting at age 30 through age 65 or 70 with a history of 3 consecutive normal Pap tests.  HPV screening.** / Every 3 years from ages 30 through ages 65 to 70 with a history of 3 consecutive normal Pap tests.  Fecal occult blood test (FOBT) of stool. / Every year beginning at age 50 and continuing until age 75. You may not need to do this test if you get a colonoscopy every 10 years.  Flexible sigmoidoscopy or colonoscopy.** / Every 5 years for a flexible sigmoidoscopy or every 10 years for a colonoscopy beginning at age 50 and continuing until age 75.  Hepatitis C blood test.** / For all people born from 1945 through 1965 and any individual with known risks for hepatitis C.  Skin self-exam. / Monthly.  Influenza immunization.** / Every year.  Pneumococcal polysaccharide immunization.** / 1 to 2 doses if you smoke cigarettes or if you have certain chronic medical conditions.  Tetanus, diphtheria, pertussis (Tdap, Td) immunization.** / A one-time dose of Tdap vaccine. After that, you need a Td booster dose every 10 years.  Measles, mumps, rubella (MMR) immunization. / You need at least 1 dose of MMR if you were born in 1957 or later. You may also need a second dose.  Varicella immunization.** / Consult your caregiver.  Meningococcal immunization.** / Consult your caregiver.  Hepatitis A immunization.** / Consult your caregiver. 2 doses, 6 to 18 months apart.  Hepatitis B immunization.** / Consult your caregiver. 3 doses, usually over 6 months. Ages 65 and over  Blood pressure check.** / Every 1 to 2 years.  Lipid and cholesterol check.** / Every 5 years beginning at age 20.  Clinical breast exam.** / Every year after age 40.  Mammogram.** / Every year beginning at age 40 and continuing for as long as you are in good health. Consult with your caregiver.  Pap test.** / Every 3 years starting at age 30 through age 65 or 70 with a 3  consecutive normal Pap tests. Testing can be stopped between 65 and 70 with 3 consecutive normal Pap tests and no abnormal Pap or HPV tests in the past 10 years.  HPV screening.** / Every 3 years from ages 30 through ages 65 or 70 with a history of 3 consecutive normal Pap tests. Testing can be stopped between 65 and 70 with 3 consecutive normal Pap tests and no abnormal Pap or HPV tests in the past 10 years.  Fecal occult blood test (FOBT) of stool. / Every year beginning at age 50 and continuing until age 75. You may not need to do this test if you get a colonoscopy every 10 years.  Flexible sigmoidoscopy or colonoscopy.** / Every 5 years for a flexible sigmoidoscopy or every 10 years for a colonoscopy beginning at age 50 and continuing until age 75.  Hepatitis C blood test.** / For all people born from 1945 through 1965 and any individual with known risks for hepatitis C.  Osteoporosis screening.** / A one-time screening for women ages 65 and over and women at risk for fractures or osteoporosis.  Skin self-exam. / Monthly.  Influenza immunization.** / Every year.  Pneumococcal polysaccharide immunization.** / 1 dose at age 65 (or older) if you have never been vaccinated.  Tetanus, diphtheria, pertussis (Tdap, Td) immunization. / A one-time dose of Tdap vaccine if you are over   65 and have contact with an infant, are a healthcare worker, or simply want to be protected from whooping cough. After that, you need a Td booster dose every 10 years.  Varicella immunization.** / Consult your caregiver.  Meningococcal immunization.** / Consult your caregiver.  Hepatitis A immunization.** / Consult your caregiver. 2 doses, 6 to 18 months apart.  Hepatitis B immunization.** / Check with your caregiver. 3 doses, usually over 6 months. ** Family history and personal history of risk and conditions may change your caregiver's recommendations. Document Released: 10/21/2001 Document Revised: 11/17/2011  Document Reviewed: 01/20/2011 [employer] Patient Information 2013 [employer].  

## 2012-08-04 NOTE — Assessment & Plan Note (Addendum)
Left leg secondary to recurrent knee surgery. Encouraged elevation, compression hose, minimize sodium.

## 2012-08-04 NOTE — Assessment & Plan Note (Addendum)
With certain bubble baths, tampons and pads, not symptomatic at this time.

## 2012-08-04 NOTE — Assessment & Plan Note (Signed)
Improved off of caffeine

## 2012-08-08 NOTE — Assessment & Plan Note (Signed)
No recent flares. Albuterol prn.

## 2012-08-08 NOTE — Progress Notes (Signed)
Patient ID: Veronica Stokes, female   DOB: 23-Feb-1976, 36 y.o.   MRN: [id number] Veronica Stokes [id number] 1976/07/04 08/08/2012      Progress Note New Patient  Subjective  Chief Complaint  Chief Complaint  Patient presents with  . Establish Care    new patient- transfer from OR    HPI  Patient is a consensual Caucasian female who is here today to establish care. Overall she's in good health but is in need of a new primary care Dr. Has seen multiple subspecialty physicians over the years. Sees Dr. Hyacinth Meeker with [facility] women[facility]. Last visit was in August 2013. Sees Dr Raelyn Number of [facility] and has tried some treatments without success so they are now in the process of trying to adopt. She seen Dr. Graciela Husbands of cardiology in the past for some tachybradycardia symptoms but is doing well in this regard. She said she gave up caffeine in her symptoms greatly improved. She's been struggling with some plantar wart on her left foot but using topical treatments to worsen almost resolved. No other acute illness, fevers, chills, chest pain, palpitations, shortness of breath, GI or GU complaints are noted today appear   Past Medical History  Diagnosis Date  . Chicken pox as a child  . Polycystic ovarian syndrome 8-12  . Asthma     cough variant  . Allergic state 12/18/2008    Qualifier: Diagnosis of  By: Beverely Low MD, Natalia Leatherwood      Past Surgical History  Procedure Date  . Knee surgery 12-94, 10-07    X 2 left  . Wisdom tooth extraction 2001    Family History  Problem Relation Age of Onset  . Diabetes Mother     type 2  . Hypertension Mother   . Diabetes Father     type 2- off of everything since gastric bypass  . Mental illness Sister     bipolar, ADHD  . Mental illness Maternal Grandmother     manic and bipolar  . Stroke Maternal Grandmother   . Alcohol abuse Maternal Grandmother   . Heart disease Maternal Grandfather   . Heart attack Maternal Grandfather   . Diabetes  Paternal Grandmother     type 2  . Stroke Paternal Grandmother   . Thyroid disease Paternal Grandmother   . Stroke Paternal Grandfather   . Heart attack Paternal Grandfather   . Cancer Maternal Aunt     breast  . Cancer Maternal Aunt     breast     History   Social History  . Marital Status: Single    Spouse Name: N/A    Number of Children: N/A  . Years of Education: N/A   Occupational History  . Not on file.   Social History Main Topics  . Smoking status: Never Smoker   . Smokeless tobacco: Never Used  . Alcohol Use: No  . Drug Use: No  . Sexually Active: Yes -- Female partner(s)   Other Topics Concern  . Not on file   Social History Narrative  . No narrative on file    Current Outpatient Prescriptions on File Prior to Visit  Medication Sig Dispense Refill  . calcium carbonate (OS-CAL) 600 MG TABS Take 600 mg by mouth daily.      [name] albuterol (PROVENTIL HFA;VENTOLIN HFA) 108 (90 BASE) MCG/ACT inhaler Inhale 2 puffs into the lungs every 6 (six) hours as needed for wheezing.  1 Inhaler  0    Allergies  Allergen Reactions  .  Latex   . Penicillins     REACTION: SWELLING    Review of Systems  Review of Systems  Constitutional: Negative for fever and malaise/fatigue.  HENT: Negative for congestion.   Eyes: Negative for discharge.  Respiratory: Negative for shortness of breath.   Cardiovascular: Negative for chest pain, palpitations and leg swelling.  Gastrointestinal: Negative for nausea, abdominal pain and diarrhea.  Genitourinary: Negative for dysuria.  Musculoskeletal: Negative for falls.  Skin: Negative for itching and rash.       Small plantar warts on bottom of left foot, improving with topical treatments  Neurological: Negative for loss of consciousness and headaches.  Endo/Heme/Allergies: Negative for polydipsia.  Psychiatric/Behavioral: Negative for depression and suicidal ideas. The patient is not nervous/anxious and does not have insomnia.      Objective  BP 145/87  Pulse 79  Temp 98.4 F (36.9 C) (Temporal)  Ht 4\' 11"  (1.499 m)  Wt 199 lb 12.8 oz (90.629 kg)  BMI 40.35 kg/m2  SpO2 99%  LMP 07/05/2012  Physical Exam  Physical Exam  Constitutional: She is oriented to person, place, and time and well-developed, well-nourished, and in no distress. No distress.  HENT:  Head: Normocephalic and atraumatic.  Right Ear: External ear normal.  Left Ear: External ear normal.  Nose: Nose normal.  Mouth/Throat: Oropharynx is clear and moist. No oropharyngeal exudate.  Eyes: Conjunctivae normal are normal. Pupils are equal, round, and reactive to light. Right eye exhibits no discharge. Left eye exhibits no discharge. No scleral icterus.  Neck: Normal range of motion. Neck supple. No thyromegaly present.  Cardiovascular: Normal rate, regular rhythm, normal heart sounds and intact distal pulses.   No murmur heard. Pulmonary/Chest: Effort normal and breath sounds normal. No respiratory distress. She has no wheezes. She has no rales.  Abdominal: Soft. Bowel sounds are normal. She exhibits no distension and no mass. There is no tenderness.  Musculoskeletal: Normal range of motion. She exhibits no edema and no tenderness.  Lymphadenopathy:    She has no cervical adenopathy.  Neurological: She is alert and oriented to person, place, and time. She has normal reflexes. No cranial nerve deficit. Coordination normal.  Skin: Skin is warm and dry. No rash noted. She is not diaphoretic.  Psychiatric: Mood, memory and affect normal.       Assessment & Plan  ADD Previously on meds. Doing well without them.  BRADYCARDIA-TACHYCARDIA SYNDROME Improved off of caffeine  EDEMA- LOCALIZED Left leg secondary to recurrent knee surgery. Encouraged elevation, compression hose, minimize sodium.  Other abscess of vulva With certain bubble baths, tampons and pads, not symptomatic at this time.  Polycystic ovarian syndrome Underwent  infertility treatments without success. Failed IUI and IVF has not chosen try this. Tolerating Metformin  ELEVATED BLOOD PRESSURE WITHOUT DIAGNOSIS OF HYPERTENSION Mild elevation today, will monitor labs, recommend DASH diet.  Asthma No recent flares. Albuterol prn.

## 2012-08-08 NOTE — Assessment & Plan Note (Signed)
Mild elevation today, will monitor labs, recommend DASH diet.

## 2012-09-21 ENCOUNTER — Encounter: Payer: Self-pay | Admitting: Family Medicine

## 2012-11-05 ENCOUNTER — Ambulatory Visit: Payer: BC Managed Care – PPO | Admitting: Family Medicine

## 2012-11-12 ENCOUNTER — Ambulatory Visit: Payer: BC Managed Care – PPO | Admitting: Family Medicine

## 2012-12-23 ENCOUNTER — Ambulatory Visit: Payer: BC Managed Care – PPO | Admitting: Family Medicine

## 2012-12-23 ENCOUNTER — Ambulatory Visit: Payer: BC Managed Care – PPO | Admitting: Nurse Practitioner

## 2013-05-16 ENCOUNTER — Encounter: Payer: Self-pay | Admitting: Gynecology

## 2013-05-16 ENCOUNTER — Ambulatory Visit (INDEPENDENT_AMBULATORY_CARE_PROVIDER_SITE_OTHER): Payer: BC Managed Care – PPO | Admitting: Gynecology

## 2013-05-16 VITALS — BP 118/78 | HR 69 | Resp 18 | Ht 59.75 in | Wt 196.0 lb

## 2013-05-16 DIAGNOSIS — E282 Polycystic ovarian syndrome: Secondary | ICD-10-CM | POA: Insufficient documentation

## 2013-05-16 DIAGNOSIS — Z0000 Encounter for general adult medical examination without abnormal findings: Secondary | ICD-10-CM

## 2013-05-16 DIAGNOSIS — Z01419 Encounter for gynecological examination (general) (routine) without abnormal findings: Secondary | ICD-10-CM

## 2013-05-16 DIAGNOSIS — Z124 Encounter for screening for malignant neoplasm of cervix: Secondary | ICD-10-CM

## 2013-05-16 LAB — POCT URINALYSIS DIPSTICK
Leukocytes, UA: NEGATIVE
Urobilinogen, UA: NEGATIVE

## 2013-05-16 NOTE — Patient Instructions (Signed)
High Protein Diet A high protein diet means that high protein foods are added to your diet. Getting more protein in the diet is important for a number of reasons. Protein helps the body to build tissue, muscle, and to repair damage. People who have had surgery, injuries such as broken bones, infections, and burns, or illnesses such as cancer, may need more protein in their diet.  SERVING SIZES Measuring foods and serving sizes helps to make sure you are getting the right amount of food. The list below tells how big or small some common serving sizes are.   1 oz.........4 stacked dice.  3 oz........Marland KitchenDeck of cards.  1 tsp.......Marland KitchenTip of little finger.  1 tbs......Marland KitchenMarland KitchenThumb.  2 tbs.......Marland KitchenGolf ball.   cup......Marland KitchenHalf of a fist.  1 cup.......Marland KitchenA fist. FOOD SOURCES OF PROTEIN Listed below are some food sources of protein and the amount of protein they contain. Your Registered Dietitian can calculate how many grams of protein you need for your medical condition. High protein foods can be added to the diet at mealtime or as snacks. Be sure to have at least 1 protein-containing food at each meal and snack to ensure adequate intake.  Meats and Meat Substitutes / Protein (g)  3 oz poultry (chicken, [place]) / 26 g  3 oz tuna, canned in water / 26 g  3 oz fish (cod) / 21 g  3 oz red meat (beef, pork) / 21 g  4 oz tofu / 9 g  1 egg / 6 g   cup egg substitute / 5 g  1 cup dried beans / 15 g  1 cup soy milk / 4 g Dairy / Protein (g)  1 cup milk (skim, 1%, 2%, whole) / 8 g   cup evaporated milk / 9 g  1 cup buttermilk / 8 g  1 cup low-fat plain yogurt / 11 g  1 cup regular plain yogurt / 9 g   cup cottage cheese / 14 g  1 oz cheddar cheese / 7 g Nuts / Protein (g)  2 tbs peanut butter / 8 g  1 oz peanuts / 7 g  2 tbs cashews / 5 g  2 tbs almonds / 5 g Document Released: 08/25/2005 Document Revised: 11/17/2011 Document Reviewed: 05/28/2007 ExitCare Patient Information  2014 [place], [place].  Ultimately eliminate carbohydrates then slowly add back complex carbohydrates-then metfromin can be slowly added, end point is a regular monthly cycles
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
## 2013-05-18 ENCOUNTER — Ambulatory Visit: Payer: BC Managed Care – PPO | Admitting: Physician Assistant

## 2013-05-20 ENCOUNTER — Encounter: Payer: Self-pay | Admitting: Physician Assistant

## 2013-05-20 ENCOUNTER — Ambulatory Visit (INDEPENDENT_AMBULATORY_CARE_PROVIDER_SITE_OTHER): Payer: BC Managed Care – PPO | Admitting: Physician Assistant

## 2013-05-20 VITALS — BP 110/78 | HR 79 | Temp 97.7°F | Resp 14 | Ht 60.0 in | Wt 197.0 lb

## 2013-05-20 DIAGNOSIS — H9202 Otalgia, left ear: Secondary | ICD-10-CM

## 2013-05-20 DIAGNOSIS — H9209 Otalgia, unspecified ear: Secondary | ICD-10-CM

## 2013-05-20 DIAGNOSIS — Z0279 Encounter for issue of other medical certificate: Secondary | ICD-10-CM

## 2013-05-20 LAB — IPS PAP TEST WITH HPV
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
## 2013-05-20 NOTE — Assessment & Plan Note (Signed)
Physical exam shows no signs of trauma or infection.  Patient with history of seasonal allergies.  Encourage saline nasal spray and daily claritin. Please return if symptoms persist or worsen

## 2013-05-20 NOTE — Assessment & Plan Note (Signed)
Paperwork filled out and faxed to appropriate destination

## 2013-06-20 ENCOUNTER — Ambulatory Visit: Payer: BC Managed Care – PPO | Admitting: Gynecology

## 2013-07-11 ENCOUNTER — Ambulatory Visit: Payer: BC Managed Care – PPO | Admitting: Gynecology

## 2013-07-14 ENCOUNTER — Other Ambulatory Visit: Payer: Self-pay

## 2013-08-24 ENCOUNTER — Telehealth: Payer: Self-pay | Admitting: *Deleted

## 2013-08-24 DIAGNOSIS — J45909 Unspecified asthma, uncomplicated: Secondary | ICD-10-CM

## 2013-08-24 MED ORDER — ALBUTEROL SULFATE HFA 108 (90 BASE) MCG/ACT IN AERS: 2.0000 | INHALATION_SPRAY | Freq: Four times a day (QID) | RESPIRATORY_TRACT | Status: DC | PRN

## 2013-08-24 NOTE — Telephone Encounter (Signed)
Received call from pt requesting refill of albuterol inhaler.  Refill sent to [facility], pt aware.

## 2014-03-15 ENCOUNTER — Encounter: Payer: Self-pay | Admitting: Emergency Medicine

## 2014-03-15 ENCOUNTER — Telehealth: Payer: Self-pay | Admitting: Gynecology

## 2014-03-15 ENCOUNTER — Emergency Department
Admission: EM | Admit: 2014-03-15 | Discharge: 2014-03-15 | Disposition: A | Discharge status: Home / Self Care | Payer: BC Managed Care – PPO | Source: Home / Self Care | Attending: Family Medicine | Admitting: Family Medicine

## 2014-03-15 DIAGNOSIS — N72 Inflammatory disease of cervix uteri: Secondary | ICD-10-CM

## 2014-03-15 DIAGNOSIS — N7689 Other specified inflammation of vagina and vulva: Secondary | ICD-10-CM

## 2014-03-15 DIAGNOSIS — N758 Other diseases of Bartholin's gland: Secondary | ICD-10-CM

## 2014-03-15 MED ORDER — DOXYCYCLINE HYCLATE 100 MG PO CAPS: 100.0000 mg | ORAL_CAPSULE | Freq: Two times a day (BID) | ORAL | Status: DC

## 2014-03-15 NOTE — Telephone Encounter (Signed)
Patient says she has a "boil" and Shirlyn Goltzatty Grubb normally gives her a standing  prescription for this. Patient needs this prescription today, going out of town in the morning.

## 2014-03-15 NOTE — Discharge Instructions (Signed)
Continue warm soaks.  May take Tylenol, or Ibuprofen 200mg , 4 tabs every 8 hours with food.    Bartholin's Cyst or Abscess Bartholin's glands are small glands located within the folds of skin (labia) along the sides of the lower opening of the vagina (birth canal). A cyst may develop when the duct of the gland becomes blocked. When this happens, fluid that accumulates within the cyst can become infected. This is known as an abscess. The Bartholin gland produces a mucous fluid to lubricate the outside of the vagina during sexual intercourse. SYMPTOMS   Patients with a small cyst may not have any symptoms.  Mild discomfort to severe pain depending on the size of the cyst and if it is infected (abscess).  Pain, redness, and swelling around the lower opening of the vagina.  Painful intercourse.  Pressure in the perineal area.  Swelling of the lips of the vagina (labia).  The cyst or abscess can be on one side or both sides of the vagina. DIAGNOSIS   A large swelling is seen in the lower vagina area by your caregiver.  Painful to touch.  Redness and pain, if it is an abscess. TREATMENT   Sometimes the cyst will go away on its own.  Apply warm wet compresses to the area or take hot sitz baths several times a day.  An incision to drain the cyst or abscess with local anesthesia.  Culture the pus, if it is an abscess.  Antibiotic treatment, if it is an abscess.  Cut open the gland and suture the edges to make the opening of the gland bigger (marsupialization).  Remove the whole gland if the cyst or abscess returns. PREVENTION   Practice good hygiene.  Clean the vaginal area with a mild soap and soft cloth when bathing.  Do not rub hard in the vaginal area when bathing.  Protect the crotch area with a padded cushion if you take long bike rides or ride horses.  Be sure you are well lubricated when you have sexual intercourse. HOME CARE INSTRUCTIONS   If your cyst or  abscess was opened, a small piece of gauze, or a drain, may have been placed in the wound to allow drainage. Do not remove this gauze or drain unless directed by your caregiver.  Wear feminine pads, not tampons, as needed for any drainage or bleeding.  If antibiotics were prescribed, take them exactly as directed. Finish the entire course.  Only take over-the-counter or prescription medicines for pain, discomfort, or fever as directed by your caregiver. SEEK IMMEDIATE MEDICAL CARE IF:   You have an increase in pain, redness, swelling, or drainage.  You have bleeding from the wound which results in the use of more than the number of pads suggested by your caregiver in 24 hours.  You have chills.  You have a fever.  You develop any new problems (symptoms) or aggravation of your existing condition. MAKE SURE YOU:   Understand these instructions.  Will watch your condition.  Will get help right away if you are not doing well or get worse. Document Released: 08/25/2005 Document Revised: 11/17/2011 Document Reviewed: 04/12/2008 [facility] Patient Information 2015 [place]. This information is not intended to replace advice given to you by your health care provider. Make sure you discuss any questions you have with your health care provider.

## 2014-03-15 NOTE — ED Notes (Signed)
Pt c/o cyst on her labia and HA x 3pm today. Denies fever.

## 2014-03-15 NOTE — ED Provider Notes (Signed)
CSN: [id number]     Arrival date & time 03/15/14  1901 History   First MD Initiated Contact with Patient 03/15/14 1916     Chief Complaint  Patient presents with  . Cyst      HPI Comments: Patient noticed a tender swollen area on her left labia today after wearing tight underwear.  She states that a small amount of purulent drainage occurred.  She states that she has had several similar lesions in the past, always resolving after taking an antibiotic  The history is provided by the patient.    Past Medical History  Diagnosis Date  . Chicken pox as a child  . Polycystic ovarian syndrome 8-12  . Asthma     cough variant  . Allergic state 12/18/2008    Qualifier: Diagnosis of  By: [name], [name]     Past Surgical History  Procedure Laterality Date  . Knee surgery  12-94, 10-07    X 2 left  . Wisdom tooth extraction  2001   Family History  Problem Relation Age of Onset  . Diabetes Mother     type 2  . Hypertension Mother   . Diabetes Father     type 2- off of everything since gastric bypass  . Mental illness Sister     bipolar, ADHD  . Mental illness Maternal Grandmother     manic and bipolar  . Stroke Maternal Grandmother   . Alcohol abuse Maternal Grandmother   . Heart disease Maternal Grandfather   . Heart attack Maternal Grandfather   . Diabetes Paternal Grandmother     type 2  . Stroke Paternal Grandmother   . Thyroid disease Paternal Grandmother   . Stroke Paternal Grandfather   . Heart attack Paternal Grandfather   . Cancer Maternal Aunt     breast  . Cancer Maternal Aunt     breast    History  Substance Use Topics  . Smoking status: Never Smoker   . Smokeless tobacco: Never Used  . Alcohol Use: No   OB History   Grav Para Term Preterm Abortions TAB SAB Ect Mult Living   0 0 0 0 0 0 0 0 0 0      Review of Systems  Constitutional: Negative.   HENT: Negative.   Eyes: Negative.   Respiratory: Negative.   Cardiovascular: Negative.     Gastrointestinal: Negative.   Genitourinary: Negative for vaginal discharge, difficulty urinating, genital sores, vaginal pain and pelvic pain.       Swelling and tenderness left labia.  Musculoskeletal: Negative.     Allergies  Latex; Penicillins; and Sulfa antibiotics  Home Medications   Prior to Admission medications   Medication Sig Start Date End Date Taking? Authorizing Provider  albuterol (PROVENTIL HFA;VENTOLIN HFA) 108 (90 BASE) MCG/ACT inhaler Inhale 2 puffs into the lungs every 6 (six) hours as needed for wheezing. 08/24/13   Bradd CanaryStacey A Blyth, MD  calcium carbonate (OS-CAL) 600 MG TABS Take 600 mg by mouth daily.    Historical Provider, MD  doxycycline (VIBRAMYCIN) 100 MG capsule Take 1 capsule (100 mg total) by mouth 2 (two) times daily. (Take with food) 03/15/14   Lattie HawStephen A Khianna Blazina, MD  Pediatric Multiple Vitamins (FLINTSTONES MULTIVITAMIN PO) Take 2 tablets by mouth.    Historical Provider, MD   BP 156/97  Pulse 100  Temp(Src) 98.1 F (36.7 C) (Oral)  Resp 18  Ht 4\' 11"  (1.499 m)  Wt 203 lb (92.08 kg)  BMI 40.98 kg/m2  SpO2 99%  LMP 02/02/2014 Physical Exam  Nursing note and vitals reviewed. Constitutional: She is oriented to person, place, and time. She appears well-developed and well-nourished. No distress.  Patient is obese (BMI 41.0)  Eyes: Conjunctivae are normal. Pupils are equal, round, and reactive to light.  Genitourinary:    There is tenderness on the left labia. There is no rash, lesion or injury on the left labia.  Left labia has an area of tenderness as noted on diagram.  There is no rash, swelling, induration, erythema, or fluctuance.    Lymphadenopathy:       Left: No inguinal adenopathy present.  Neurological: She is alert and oriented to person, place, and time.  Skin: Skin is warm and dry.    ED Course  Procedures     MDM   1. Bartholin's gland infection    No I and D necessary; should resolve with antibiotic and warm sitz baths. Begin  doxycycline. Continue warm soaks.  May take Tylenol, or Ibuprofen 200mg , 4 tabs every 8 hours with food.  Followup with Family Doctor if not improved in one week.     Lattie HawStephen A Faline Langer, MD 03/15/14 [phone]

## 2014-03-15 NOTE — Telephone Encounter (Signed)
Spoke with patient after reviewing Epic and Chart with Dr. Farrel GobbleLathrop. Veronica Stokes(Patricia Rolen-Grubb, FNP out of office today). Advised patient, per Dr. Farrel GobbleLathrop, patient will need to be seen for office visit for evaluation of vulvar abcess or boil. Last annual with Dr. Farrel GobbleLathrop, no documentation of abcess. 05/2013.  Patient states she has went "about two years without anything and this is much smaller than before." No fevers or chills today. 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  Patient [place]  Advised patient if she would like to be treated today, she will need to seek care at urgent care or ER. Offered first office visit appointment for tomorrow morning at 0830 for evaluation and patient declines. She is not agreeable to this. She states, "I am not trying to be ugly to you, but this right here will make me find another doctor. Alexia Freestoneatty has always given me a prescription for this. I have had to schedule appointments with Dr. Farrel GobbleLathrop because Alexia Freestoneatty was not available and my appointments were cancelled and and rescheduled due to emergencies. Why is it okay to inconvenience me but not you?"  I advised patient while I sympathize with her frustration, that I have discussed this with Dr. Farrel GobbleLathrop and that a prescription will not be placed. Advised that it has been since 2009 that patient was seen in our office for the same complaint and treated, that these ongoing symptoms need to be evaluated prior to telephone treatment.  Again offered patient first available appointment tomorrow morning and patient declines. She said she will not be seen here at our office and said goodbye and disconnected line.  Routing to Dr. Farrel GobbleLathrop.   cc Billie RuddySally Yeakley, RN clinical supervisor.

## 2014-03-16 NOTE — Telephone Encounter (Signed)
Will close encounter

## 2014-03-18 ENCOUNTER — Telehealth: Payer: Self-pay | Admitting: Emergency Medicine

## 2014-04-28 ENCOUNTER — Emergency Department
Admission: EM | Admit: 2014-04-28 | Discharge: 2014-04-28 | Disposition: A | Discharge status: Home / Self Care | Payer: BC Managed Care – PPO | Source: Home / Self Care | Attending: Family Medicine | Admitting: Family Medicine

## 2014-04-28 ENCOUNTER — Encounter: Payer: Self-pay | Admitting: Emergency Medicine

## 2014-04-28 ENCOUNTER — Emergency Department (INDEPENDENT_AMBULATORY_CARE_PROVIDER_SITE_OTHER): Payer: BC Managed Care – PPO

## 2014-04-28 DIAGNOSIS — M7671 Peroneal tendinitis, right leg: Secondary | ICD-10-CM

## 2014-04-28 DIAGNOSIS — M659 Synovitis and tenosynovitis, unspecified: Secondary | ICD-10-CM

## 2014-04-28 DIAGNOSIS — M7730 Calcaneal spur, unspecified foot: Secondary | ICD-10-CM

## 2014-04-28 DIAGNOSIS — M779 Enthesopathy, unspecified: Secondary | ICD-10-CM

## 2014-04-28 DIAGNOSIS — M778 Other enthesopathies, not elsewhere classified: Secondary | ICD-10-CM

## 2014-04-28 DIAGNOSIS — M7750 Other enthesopathy of unspecified foot: Secondary | ICD-10-CM

## 2014-04-28 MED ORDER — MELOXICAM 15 MG PO TABS: 15.0000 mg | ORAL_TABLET | Freq: Every day | ORAL | Status: DC

## 2014-04-28 NOTE — ED Notes (Signed)
Sudden onset of right foot pain within past 24 hours; no known injury or movement that might have caused this. Has taken ibuprofen.

## 2014-04-28 NOTE — Discharge Instructions (Signed)
Wear well-fitting shoes.  Apply ice pack 3 or 4 times daily.  Begin stretching exercises.

## 2014-04-28 NOTE — ED Provider Notes (Signed)
CSN: [id number]     Arrival date & time 04/28/14  1845 History   First MD Initiated Contact with Patient 04/28/14 1908     Chief Complaint  Patient presents with  . Foot Pain      HPI Comments: Patient was simply sitting in a chair last night when she noticed pain in the dorsum of her right foot and behind lateral malleolus.  The pain has persisted today and is worse when walking and plantar flexing her foot, such as pushing down a clutch pedal.  She recalls no injury or change in physical activities.  No history of gout.  Her father has gout.  The pain is not improved with ibuprofen.  Patient is a 38 y.o. female presenting with lower extremity pain. The history is provided by the patient.  Foot Pain This is a new problem. The current episode started yesterday. The problem occurs constantly. The problem has not changed since onset.Associated symptoms comments: none. The symptoms are aggravated by walking and standing. Nothing relieves the symptoms. Treatments tried: Ibuprofen. The treatment provided no relief.    Past Medical History  Diagnosis Date  . Chicken pox as a child  . Polycystic ovarian syndrome 8-12  . Asthma     cough variant  . Allergic state 12/18/2008    Qualifier: Diagnosis of  By: Beverely Low MD, Natalia Leatherwood     Past Surgical History  Procedure Laterality Date  . Knee surgery  12-94, 10-07    X 2 left  . Wisdom tooth extraction  2001   Family History  Problem Relation Age of Onset  . Diabetes Mother     type 2  . Hypertension Mother   . Diabetes Father     type 2- off of everything since gastric bypass  . Mental illness Sister     bipolar, ADHD  . Mental illness Maternal Grandmother     manic and bipolar  . Stroke Maternal Grandmother   . Alcohol abuse Maternal Grandmother   . Heart disease Maternal Grandfather   . Heart attack Maternal Grandfather   . Diabetes Paternal Grandmother     type 2  . Stroke Paternal Grandmother   . Thyroid disease Paternal  Grandmother   . Stroke Paternal Grandfather   . Heart attack Paternal Grandfather   . Cancer Maternal Aunt     breast  . Cancer Maternal Aunt     breast    History  Substance Use Topics  . Smoking status: Never Smoker   . Smokeless tobacco: Never Used  . Alcohol Use: No   OB History   Grav Para Term Preterm Abortions TAB SAB Ect Mult Living   0 0 0 0 0 0 0 0 0 0      Review of Systems  All other systems reviewed and are negative.   Allergies  Latex; Penicillins; and Sulfa antibiotics  Home Medications   Prior to Admission medications   Medication Sig Start Date End Date Taking? Authorizing Provider  albuterol (PROVENTIL HFA;VENTOLIN HFA) 108 (90 BASE) MCG/ACT inhaler Inhale 2 puffs into the lungs every 6 (six) hours as needed for wheezing. 08/24/13   Bradd Canary, MD  calcium carbonate (OS-CAL) 600 MG TABS Take 600 mg by mouth daily.    Historical Provider, MD  meloxicam (MOBIC) 15 MG tablet Take 1 tablet (15 mg total) by mouth daily. Take with food each evening. 04/28/14   Lattie Haw, MD  Pediatric Multiple Vitamins (FLINTSTONES MULTIVITAMIN PO) Take 2 tablets by  mouth.    Historical Provider, MD   BP 131/84  Pulse 95  Temp(Src) 97.9 F (36.6 C) (Oral)  Resp 16  Ht 4\' 11"  (1.499 m)  Wt 201 lb (91.173 kg)  BMI 40.58 kg/m2  SpO2 100%  LMP 04/19/2014 Physical Exam  Nursing note and vitals reviewed. Constitutional: She is oriented to person, place, and time. She appears well-developed and well-nourished. No distress.  Patient is obese (BMI 40.6)  Eyes: Conjunctivae are normal. Pupils are equal, round, and reactive to light.  Musculoskeletal:       Right foot: She exhibits tenderness and bony tenderness. She exhibits normal range of motion, no swelling, normal capillary refill and no crepitus.       Feet:  The is mild tenderness to palpation over the right peroneal tendon.  There is mild tenderness dorsally  as noted on diagram.  The dorsal tenderness is partly  over extensor tendons.  Squeezing the mid-foot causes pain.  No erythema or warmth.  Distal neurovascular function is intact.     Neurological: She is alert and oriented to person, place, and time.  Skin: Skin is warm and dry. No rash noted. No erythema.    ED Course  Procedures  none     Imaging Review Dg Foot Complete Right  04/28/2014   CLINICAL DATA:  Pain  EXAM: RIGHT FOOT COMPLETE - 3+ VIEW  COMPARISON:  None.  FINDINGS: Frontal, oblique, and lateral views were obtained. There is no fracture or dislocation. Joint spaces appear intact. No erosive change. There is a small inferior calcaneal spur. There is a bone island in the cuboid proximally.  IMPRESSION: Small inferior calcaneal spur. No fracture or appreciable arthropathy.   Electronically Signed   By: Bretta BangWilliam  Woodruff M.D.   On: 04/28/2014 19:33     MDM   1. Peroneal tendonitis, right   2. Extensor tendonitis of foot    Begin Mobic. Wear well-fitting shoes.  Apply ice pack 3 or 4 times daily.  Begin stretching exercises. Followup with orthopedist if not improved two weeks.    Lattie HawStephen A Beese, MD 04/30/14 1122

## 2014-05-01 ENCOUNTER — Telehealth: Payer: Self-pay | Admitting: Emergency Medicine

## 2014-05-01 NOTE — ED Notes (Signed)
Message left on voice mail inquiring about patient's status and encouraging patient to call with questions/concerns.  

## 2014-06-28 ENCOUNTER — Encounter: Payer: Self-pay | Admitting: Emergency Medicine

## 2014-06-28 ENCOUNTER — Emergency Department (INDEPENDENT_AMBULATORY_CARE_PROVIDER_SITE_OTHER)
Admission: EM | Admit: 2014-06-28 | Discharge: 2014-06-28 | Disposition: A | Discharge status: Home / Self Care | Payer: BC Managed Care – PPO | Source: Home / Self Care | Attending: Family Medicine | Admitting: Family Medicine

## 2014-06-28 ENCOUNTER — Emergency Department (INDEPENDENT_AMBULATORY_CARE_PROVIDER_SITE_OTHER): Payer: BC Managed Care – PPO

## 2014-06-28 DIAGNOSIS — S8012XA Contusion of left lower leg, initial encounter: Secondary | ICD-10-CM

## 2014-06-28 DIAGNOSIS — M79662 Pain in left lower leg: Secondary | ICD-10-CM

## 2014-06-28 DIAGNOSIS — R609 Edema, unspecified: Secondary | ICD-10-CM
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
## 2014-06-28 NOTE — Discharge Instructions (Signed)
Thank you for coming in today. Take tylenol or ibuprofen for pain as needed.  Apply ice for 10-20 minutes three times daily for swelling or pain.  Come back as needed.    Contusion A contusion is a deep bruise. Contusions are the result of an injury that caused bleeding under the skin. The contusion may turn blue, purple, or yellow. Minor injuries will give you a painless contusion, but more severe contusions may stay painful and swollen for a few weeks.  CAUSES  A contusion is usually caused by a blow, trauma, or direct force to an area of the body. SYMPTOMS   Swelling and redness of the injured area.  Bruising of the injured area.  Tenderness and soreness of the injured area.  Pain. DIAGNOSIS  The diagnosis can be made by taking a history and physical exam. An X-ray, CT scan, or MRI may be needed to determine if there were any associated injuries, such as fractures. TREATMENT  Specific treatment will depend on what area of the body was injured. In general, the best treatment for a contusion is resting, icing, elevating, and applying cold compresses to the injured area. Over-the-counter medicines may also be recommended for pain control. Ask your caregiver what the best treatment is for your contusion. HOME CARE INSTRUCTIONS   Put ice on the injured area.  Put ice in a plastic bag.  Place a towel between your skin and the bag.  Leave the ice on for 15-20 minutes, 3-4 times a day, or as directed by your health care provider.  Only take over-the-counter or prescription medicines for pain, discomfort, or fever as directed by your caregiver. Your caregiver may recommend avoiding anti-inflammatory medicines (aspirin, ibuprofen, and naproxen) for 48 hours because these medicines may increase bruising.  Rest the injured area.  If possible, elevate the injured area to reduce swelling. SEEK IMMEDIATE MEDICAL CARE IF:   You have increased bruising or swelling.  You have pain that is  getting worse.  Your swelling or pain is not relieved with medicines. MAKE SURE YOU:   Understand these instructions.  Will watch your condition.  Will get help right away if you are not doing well or get worse. Document Released: 06/04/2005 Document Revised: 08/30/2013 Document Reviewed: 06/30/2011 [facility] Patient Information 2015 [place], [place]. This information is not intended to replace advice given to you by your health care provider. Make sure you discuss any questions you have with your health care provider.  Cryotherapy Cryotherapy means treatment with cold. Ice or gel packs can be used to reduce both pain and swelling. Ice is the most helpful within the first 24 to 48 hours after an injury or flare-up from overusing a muscle or joint. Sprains, strains, spasms, burning pain, shooting pain, and aches can all be eased with ice. Ice can also be used when recovering from surgery. Ice is effective, has very few side effects, and is safe for most people to use. PRECAUTIONS  Ice is not a safe treatment option for people with:  Raynaud phenomenon. This is a condition affecting small blood vessels in the extremities. Exposure to cold may cause your problems to return.  Cold hypersensitivity. There are many forms of cold hypersensitivity, including:  Cold urticaria. Red, itchy hives appear on the skin when the tissues begin to warm after being iced.  Cold erythema. This is a red, itchy rash caused by exposure to cold.  Cold hemoglobinuria. Red blood cells break down when the tissues begin to warm after  being iced. The hemoglobin that carry oxygen are passed into the urine because they cannot combine with blood proteins fast enough.  Numbness or altered sensitivity in the area being iced. If you have any of the following conditions, do not use ice until you have discussed cryotherapy with your caregiver:  Heart conditions, such as arrhythmia, angina, or chronic heart disease.  High  blood pressure.  Healing wounds or open skin in the area being iced.  Current infections.  Rheumatoid arthritis.  Poor circulation.  Diabetes. Ice slows the blood flow in the region it is applied. This is beneficial when trying to stop inflamed tissues from spreading irritating chemicals to surrounding tissues. However, if you expose your skin to cold temperatures for too long or without the proper protection, you can damage your skin or nerves. Watch for signs of skin damage due to cold. HOME CARE INSTRUCTIONS Follow these tips to use ice and cold packs safely.  Place a dry or damp towel between the ice and skin. A damp towel will cool the skin more quickly, so you may need to shorten the time that the ice is used.  For a more rapid response, add gentle compression to the ice.  Ice for no more than 10 to 20 minutes at a time. The bonier the area you are icing, the less time it will take to get the benefits of ice.  Check your skin after 5 minutes to make sure there are no signs of a poor response to cold or skin damage.  Rest 20 minutes or more between uses.  Once your skin is numb, you can end your treatment. You can test numbness by very lightly touching your skin. The touch should be so light that you do not see the skin dimple from the pressure of your fingertip. When using ice, most people will feel these normal sensations in this order: cold, burning, aching, and numbness.  Do not use ice on someone who cannot communicate their responses to pain, such as small children or people with dementia. HOW TO MAKE AN ICE PACK Ice packs are the most common way to use ice therapy. Other methods include ice massage, ice baths, and cryosprays. Muscle creams that cause a cold, tingly feeling do not offer the same benefits that ice offers and should not be used as a substitute unless recommended by your caregiver. To make an ice pack, do one of the following:  Place crushed ice or a bag of  frozen vegetables in a sealable plastic bag. Squeeze out the excess air. Place this bag inside another plastic bag. Slide the bag into a pillowcase or place a damp towel between your skin and the bag.  Mix 3 parts water with 1 part rubbing alcohol. Freeze the mixture in a sealable plastic bag. When you remove the mixture from the freezer, it will be slushy. Squeeze out the excess air. Place this bag inside another plastic bag. Slide the bag into a pillowcase or place a damp towel between your skin and the bag. SEEK MEDICAL CARE IF:  You develop white spots on your skin. This may give the skin a blotchy (mottled) appearance.  Your skin turns blue or pale.  Your skin becomes waxy or hard.  Your swelling gets worse. MAKE SURE YOU:   Understand these instructions.  Will watch your condition.  Will get help right away if you are not doing well or get worse. Document Released: 04/21/2011 Document Revised: 01/09/2014 Document Reviewed: 04/21/2011 Exit[employer]  Patient Information ©2015 [employer]. This information is not intended to replace advice given to you by your health care provider. Make sure you discuss any questions you have with your health care provider. ° °
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
## 2014-07-11 ENCOUNTER — Telehealth: Payer: Self-pay | Admitting: Nurse Practitioner

## 2014-07-11 DIAGNOSIS — Z0189 Encounter for other specified special examinations: Secondary | ICD-10-CM

## 2014-07-11 NOTE — Telephone Encounter (Signed)
Patient requesting a lab visit to check her thyroid. Appointment scheduled for 07/19/14 and will need an order please?

## 2014-07-11 NOTE — Telephone Encounter (Signed)
Patient is scheduled for lab appointment on 11/11. Patient wants to have TSH checked. Okay to place order at this time?

## 2014-07-13 NOTE — Telephone Encounter (Signed)
yes

## 2014-07-13 NOTE — Telephone Encounter (Signed)
Order placed for future TSH draw.  Routing to provider for final review. Patient agreeable to disposition. Will close encounter

## 2014-07-19 ENCOUNTER — Other Ambulatory Visit: Payer: BC Managed Care – PPO

## 2014-08-02 ENCOUNTER — Other Ambulatory Visit (INDEPENDENT_AMBULATORY_CARE_PROVIDER_SITE_OTHER): Payer: BC Managed Care – PPO

## 2014-08-02 DIAGNOSIS — Z0189 Encounter for other specified special examinations: Secondary | ICD-10-CM

## 2014-08-02 DIAGNOSIS — Z0000 Encounter for general adult medical examination without abnormal findings: Secondary | ICD-10-CM

## 2014-08-02 LAB — TSH: TSH: 2.1 u[IU]/mL (ref 0.350–4.500)

## 2014-08-28 ENCOUNTER — Telehealth: Payer: Self-pay | Admitting: Nurse Practitioner

## 2014-08-28 ENCOUNTER — Ambulatory Visit: Payer: BC Managed Care – PPO | Admitting: Nurse Practitioner

## 2014-08-28 NOTE — Telephone Encounter (Signed)
Patient canceled her appointment today for AEX due to menses. Patient rescheduled to 10/02/13.

## 2014-10-02 ENCOUNTER — Ambulatory Visit (INDEPENDENT_AMBULATORY_CARE_PROVIDER_SITE_OTHER): Payer: BC Managed Care – PPO | Admitting: Nurse Practitioner

## 2014-10-02 ENCOUNTER — Encounter: Payer: Self-pay | Admitting: Nurse Practitioner

## 2014-10-02 VITALS — BP 120/76 | HR 80 | Ht 59.75 in | Wt 203.0 lb

## 2014-10-02 DIAGNOSIS — Z0000 Encounter for general adult medical examination without abnormal findings: Secondary | ICD-10-CM

## 2014-10-02 DIAGNOSIS — Z01419 Encounter for gynecological examination (general) (routine) without abnormal findings: Secondary | ICD-10-CM

## 2014-10-02 LAB — POCT URINALYSIS DIPSTICK
BILIRUBIN UA: NEGATIVE
GLUCOSE UA: NEGATIVE
KETONES UA: NEGATIVE
Leukocytes, UA: NEGATIVE
Nitrite, UA: NEGATIVE
Protein, UA: NEGATIVE
UROBILINOGEN UA: NEGATIVE
pH, UA: 7

## 2014-10-02 LAB — HEMOGLOBIN, FINGERSTICK: Hemoglobin, fingerstick: 12.4 g/dL (ref 12.0–16.0)

## 2014-10-02 MED ORDER — METFORMIN HCL ER 500 MG PO TB24: 500.0000 mg | ORAL_TABLET | Freq: Every day | ORAL | Status: DC

## 2014-10-02 NOTE — Patient Instructions (Signed)

## 2014-10-02 NOTE — Progress Notes (Signed)
Patient ID: Veronica Stokes, female   DOB: 12-21-1975, 39 y.o.   MRN: [id number] 39 y.o. G0 Married  Caucasian Fe here for annual exam.  Menses is regular 3- 35 days.  Lasting 4-5 days.  No cramps, no PMS, some fatigue. Flow is heavy for 2 days super tampon changing every 3-4 hours.  Adopted a 39 year old from foster care, final 08/29/13 at age 89.  Patient's last menstrual period was 09/28/2014.          Sexually active: Yes.    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    walking 3 times per week Smoker:  no  Health Maintenance: Pap:  05/16/13, negative with neg HR HPV TDaP:  UTD 2013 ? (fostering and Grad School) Labs: HB:  12.4  Urine:  Large RBC - menses   reports that she has never smoked. She has never used smokeless tobacco. She reports that she does not drink alcohol or use illicit drugs.  Past Medical History  Diagnosis Date  . Chicken pox as a child  . Polycystic ovarian syndrome 8-12  . Asthma     cough variant  . Allergic state 12/18/2008    Qualifier: Diagnosis of  By: [name], [name]      Past Surgical History  Procedure Laterality Date  . Knee surgery  12-94, 10-07    X 2 left  . Wisdom tooth extraction  2001    Current Outpatient Prescriptions  Medication Sig Dispense Refill  . albuterol (PROVENTIL HFA;VENTOLIN HFA) 108 (90 BASE) MCG/ACT inhaler Inhale 2 puffs into the lungs every 6 (six) hours as needed for wheezing. 18 g 1  . calcium carbonate (OS-CAL) 600 MG TABS Take 600 mg by mouth daily.    . meloxicam (MOBIC) 15 MG tablet Take 1 tablet (15 mg total) by mouth daily. Take with food each evening. 15 tablet 0  . Pediatric Multiple Vitamins (FLINTSTONES MULTIVITAMIN PO) Take 2 tablets by mouth.    . metFORMIN (GLUCOPHAGE XR) 500 MG 24 hr tablet Take 1 tablet (500 mg total) by mouth daily with breakfast. 30 tablet 3   No current facility-administered medications for this visit.    Family  History  Problem Relation Age of Onset  . Diabetes Mother     type 2  . Hypertension Mother   . Diabetes Father     type 2- off of everything since gastric bypass  . Mental illness Sister     bipolar, ADHD  . Mental illness Maternal Grandmother     manic and bipolar  . Stroke Maternal Grandmother   . Alcohol abuse Maternal Grandmother   . Heart disease Maternal Grandfather   . Heart attack Maternal Grandfather   . Diabetes Paternal Grandmother     type 2  . Stroke Paternal Grandmother   . Thyroid disease Paternal Grandmother   . Stroke Paternal Grandfather   . Heart attack Paternal Grandfather   . Cancer Maternal Aunt     breast  . Cancer Maternal Aunt     breast     ROS:  Pertinent items are noted in HPI.  Otherwise, a comprehensive ROS was negative.  Exam:   BP 120/76 mmHg  Pulse 80  Ht 4' 11.75" (1.518 m)  Wt 203 lb (92.08 kg)  BMI 39.96 kg/m2  LMP 09/28/2014 Height: 4' 11.75" (151.8 cm) Ht Readings from Last 3 Encounters:  10/02/14 4' 11.75" (1.518 m)  06/28/14 4'  1" (1.245 m)  04/28/14  (1.499 m)    General appearance: alert, cooperative and appears stated age Head: Normocephalic, without obvious abnormality, atraumatic Neck: no adenopathy, supple, symmetrical, trachea midline and thyroid normal to inspection and palpation Lungs: clear to auscultation bilaterally Breasts: normal appearance, no masses or tenderness Heart: regular rate and rhythm Abdomen: soft, non-tender; no masses,  no organomegaly Extremities: extremities normal, atraumatic, no cyanosis or edema Skin: Skin color, texture, turgor normal. No rashes or lesions Lymph nodes: Cervical, supraclavicular, and axillary nodes normal. No abnormal inguinal nodes palpated Neurologic: Grossly normal   Pelvic: External genitalia:  no lesions              Urethra:  normal appearing urethra with no masses, tenderness or lesions              Bartholin's and Skene's: normal                 Vagina:  normal appearing vagina with normal color and discharge, no lesions              Cervix: anteverted              Pap taken: No. 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  P:   Reviewed health and wellness pertinent to exam  Pap smear not taken today  Orders for future lab  Will try Metformin XR to see if less GI symptoms for 3 months - she will then call us with outcome.  Counseled on breast self exam, adequate intake of calcium and vitamin D, diet and exercise return annually or prn  An After Visit Summary was printed and given to the patient.

## 2014-10-03 NOTE — Progress Notes (Signed)
Reviewed personally.  M. Suzanne Nickolis Diel, MD.  

## 2014-10-09 ENCOUNTER — Other Ambulatory Visit: Payer: BC Managed Care – PPO

## 2014-10-16 ENCOUNTER — Other Ambulatory Visit: Payer: BC Managed Care – PPO

## 2014-10-19 ENCOUNTER — Ambulatory Visit (INDEPENDENT_AMBULATORY_CARE_PROVIDER_SITE_OTHER): Payer: BC Managed Care – PPO | Admitting: Family Medicine

## 2014-10-19 ENCOUNTER — Encounter: Payer: Self-pay | Admitting: Family Medicine

## 2014-10-19 VITALS — BP 132/72 | HR 108 | Temp 98.0°F | Ht 59.0 in | Wt 207.5 lb

## 2014-10-19 DIAGNOSIS — J4520 Mild intermittent asthma, uncomplicated: Secondary | ICD-10-CM

## 2014-10-19 DIAGNOSIS — E282 Polycystic ovarian syndrome: Secondary | ICD-10-CM

## 2014-10-19 DIAGNOSIS — R030 Elevated blood-pressure reading, without diagnosis of hypertension: Secondary | ICD-10-CM

## 2014-10-19 NOTE — Progress Notes (Signed)
Pre visit review using our clinic review tool, if applicable. No additional management support is needed unless otherwise documented below in the visit note. 

## 2014-10-19 NOTE — Patient Instructions (Signed)
Try Salon Pas gel to finger three times  Ganglion Cyst A ganglion cyst is a noncancerous, fluid-filled lump that occurs near joints or tendons. The ganglion cyst grows out of a joint or the lining of a tendon. It most often develops in the hand or wrist but can also develop in the shoulder, elbow, hip, knee, ankle, or foot. The round or oval ganglion can be pea sized or larger than a grape. Increased activity may enlarge the size of the cyst because more fluid starts to build up.  CAUSES  It is not completely known what causes a ganglion cyst to grow. However, it may be related to:  Inflammation or irritation around the joint.  An injury.  Repetitive movements or overuse.  Arthritis. SYMPTOMS  A lump most often appears in the hand or wrist, but can occur in other areas of the body. Generally, the lump is painless without other symptoms. However, sometimes pain can be felt during activity or when pressure is applied to the lump. The lump may even be tender to the touch. Tingling, pain, numbness, or muscle weakness can occur if the ganglion cyst presses on a nerve. Your grip may be weak and you may have less movement in your joints.  DIAGNOSIS  Ganglion cysts are most often diagnosed based on a physical exam, noting where the cyst is and how it looks. Your caregiver will feel the lump and may shine a light alongside it. If it is a ganglion, a light often shines through it. Your caregiver may order an X-ray, ultrasound, or MRI to rule out other conditions. TREATMENT  Ganglions usually go away on their own without treatment. If pain or other symptoms are involved, treatment may be needed. Treatment is also needed if the ganglion limits your movement or if it gets infected. Treatment options include:  Wearing a wrist or finger brace or splint.  Taking anti-inflammatory medicine.  Draining fluid from the lump with a needle (aspiration).  Injecting a steroid into the joint.  Surgery to remove  the ganglion cyst and its stalk that is attached to the joint or tendon. However, ganglion cysts can grow back. HOME CARE INSTRUCTIONS   Do not press on the ganglion, poke it with a needle, or hit it with a heavy object. You may rub the lump gently and often. Sometimes fluid moves out of the cyst.  Only take medicines as directed by your caregiver.  Wear your brace or splint as directed by your caregiver. SEEK MEDICAL CARE IF:   Your ganglion becomes larger or more painful.  You have increased redness, red streaks, or swelling.  You have pus coming from the lump.  You have weakness or numbness in the affected area. MAKE SURE YOU:   Understand these instructions.  Will watch your condition.  Will get help right away if you are not doing well or get worse. Document Released: 08/22/2000 Document Revised: 05/19/2012 Document Reviewed: 10/19/2007 [facility] Patient Information 2015 [place]Care, [place]LL[place]. This information is not intended to replace advice given to you by your health care provider. Make sure you discuss any questions you have with your health care provider.

## 2014-10-27 ENCOUNTER — Telehealth: Payer: Self-pay | Admitting: Nurse Practitioner

## 2014-10-27 ENCOUNTER — Other Ambulatory Visit: Payer: BC Managed Care – PPO

## 2014-10-27 NOTE — Telephone Encounter (Signed)
Left message to reschedule lab appointment. 

## 2014-11-02 ENCOUNTER — Encounter: Payer: Self-pay | Admitting: Family Medicine

## 2014-11-02 NOTE — Assessment & Plan Note (Deleted)
Tolerating Metformin, encouraged to minimize simple sugars 

## 2014-11-02 NOTE — Progress Notes (Signed)
Veronica Stokes  [id number] 10-Aug-1976 11/02/2014      Progress Note-Follow Up  Subjective  Chief Complaint  Chief Complaint  Patient presents with  . Follow-up    Paperwork    HPI  Patient is a 39 y.o. female in today for routine medical care. Patient in today to have paperwork completed which is accomplished at today's visit. She reports feeling well. She denies any recent flare of her asthma. No recent illness. Denies CP/palp/SOB/HA/congestion/fevers/GI or GU c/o. Taking meds as prescribed  Past Medical History  Diagnosis Date  . Chicken pox as a child  . Polycystic ovarian syndrome 8-12  . Asthma     cough variant  . Allergic state 12/18/2008    Qualifier: Diagnosis of  By: [name], [name]      Past Surgical History  Procedure Laterality Date  . Knee surgery  12-94, 10-07    X 2 left  . Wisdom tooth extraction  2001    Family History  Problem Relation Age of Onset  . Diabetes Mother     type 2  . Hypertension Mother   . Diabetes Father     type 2- off of everything since gastric bypass  . Mental illness Sister     bipolar, ADHD  . Mental illness Maternal Grandmother     manic and bipolar  . Stroke Maternal Grandmother   . Alcohol abuse Maternal Grandmother   . Heart disease Maternal Grandfather   . Heart attack Maternal Grandfather   . Diabetes Paternal Grandmother     type 2  . Stroke Paternal Grandmother   . Thyroid disease Paternal Grandmother   . Stroke Paternal Grandfather   . Heart attack Paternal Grandfather   . Cancer Maternal Aunt     breast  . Cancer Maternal Aunt     breast     History   Social History  . Marital Status: Married    Spouse Name: N/A  . Number of Children: N/A  . Years of Education: N/A   Occupational History  . Not on file.   Social History Main Topics  . Smoking status: Never Smoker   . Smokeless tobacco: Never Used  . Alcohol Use: No  . Drug Use: No  . Sexual Activity:    Partners: Male    Other Topics Concern  . Not on file   Social History Narrative    Current Outpatient Prescriptions on File Prior to Visit  Medication Sig Dispense Refill  . albuterol (PROVENTIL HFA;VENTOLIN HFA) 108 (90 BASE) MCG/ACT inhaler Inhale 2 puffs into the lungs every 6 (six) hours as needed for wheezing. 18 g 1  . calcium carbonate (OS-CAL) 600 MG TABS Take 600 mg by mouth daily.    . metFORMIN (GLUCOPHAGE XR) 500 MG 24 hr tablet Take 1 tablet (500 mg total) by mouth daily with breakfast. 30 tablet 3  . Pediatric Multiple Vitamins (FLINTSTONES MULTIVITAMIN PO) Take 2 tablets by mouth.     No current facility-administered medications on file prior to visit.    Allergies  Allergen Reactions  . Eggs Or Egg-Derived Products   . Latex   . Penicillins     REACTION: SWELLING  . Sulfa Antibiotics Itching    Review of Systems  Review of Systems  Constitutional: Negative for fever and malaise/fatigue.  HENT: Negative for congestion.   Eyes: Negative for discharge.  Respiratory: Negative for shortness of breath.   Cardiovascular: Negative for chest pain, palpitations and leg swelling.  Gastrointestinal: Negative for nausea, abdominal pain and diarrhea.  Genitourinary: Negative for dysuria.  Musculoskeletal: Negative for falls.  Skin: Negative for rash.  Neurological: Negative for loss of consciousness and headaches.  Endo/Heme/Allergies: Negative for polydipsia.  Psychiatric/Behavioral: Negative for depression and suicidal ideas. The patient is not nervous/anxious and does not have insomnia.     Objective  BP 132/72 mmHg  Pulse 108  Temp(Src) 98 F (36.7 C) (Oral)  Ht  (1.499 m)  Wt 207 lb 8 oz (94.121 kg)  BMI 41.89 kg/m2  SpO2 99%  LMP 09/28/2014  Physical Exam  Physical Exam  Constitutional: She is oriented to person, place, and time and well-developed, well-nourished, and in no distress. No distress.  HENT:  Head: Normocephalic and atraumatic.  Eyes:  Conjunctivae are normal.  Neck: Neck supple. No thyromegaly present.  Cardiovascular: Normal rate, regular rhythm and normal heart sounds.   No murmur heard. Pulmonary/Chest: Effort normal and breath sounds normal. She has no wheezes.  Abdominal: She exhibits no distension and no mass.  Musculoskeletal: She exhibits no edema.  Lymphadenopathy:    She has no cervical adenopathy.  Neurological: She is alert and oriented to person, place, and time.  Skin: Skin is warm and dry. No rash noted. She is not diaphoretic.  Psychiatric: Memory, affect and judgment normal.    Lab Results  Component Value Date   TSH 2.100 08/02/2014   Lab Results  Component Value Date   WBC 6.7 06/28/2010   HGB 13.0 06/28/2010   HCT 37.6 06/28/2010   MCV 85.9 06/28/2010   PLT 296.0 06/28/2010   Lab Results  Component Value Date   CREATININE 0.7 06/28/2010   BUN 12 06/28/2010   NA 139 06/28/2010   K 4.5 06/28/2010   CL 104 06/28/2010   CO2 29 06/28/2010   Lab Results  Component Value Date   ALT 22 06/08/2009   AST 21 06/08/2009    Assessment & Plan  Asthma Well controlled, responsive to albuterol   ELEVATED BLOOD PRESSURE WITHOUT DIAGNOSIS OF HYPERTENSION Well controlled, no changes to meds. Encouraged heart healthy diet such as the DASH diet and exercise as tolerated.    Polycystic ovarian syndrome Tolerating Metformin, encouraged to minimize simple sugars

## 2014-11-02 NOTE — Assessment & Plan Note (Signed)
Well controlled, responsive to albuterol

## 2014-11-02 NOTE — Assessment & Plan Note (Signed)
Well controlled, no changes to meds. Encouraged heart healthy diet such as the DASH diet and exercise as tolerated.  °

## 2014-11-02 NOTE — Assessment & Plan Note (Signed)
Tolerating Metformin, encouraged to minimize simple sugars

## 2014-11-03 NOTE — Telephone Encounter (Signed)
Pt was scheduled for routine fasting labs as part of her AEX.    Pt has cancelled two previous lab appointments.  Do we need to pursue rescheduling this patient further?  Please advise.

## 2014-11-06 NOTE — Telephone Encounter (Signed)
No further need to contact - she will return when she is able to schedule or may get at PCP. Closed this encounter.

## 2014-11-12 ENCOUNTER — Encounter: Payer: Self-pay | Admitting: Emergency Medicine

## 2014-11-12 ENCOUNTER — Emergency Department
Admission: EM | Admit: 2014-11-12 | Discharge: 2014-11-12 | Disposition: A | Discharge status: Home / Self Care | Payer: BC Managed Care – PPO | Source: Home / Self Care | Attending: Emergency Medicine | Admitting: Emergency Medicine

## 2014-11-12 ENCOUNTER — Emergency Department (INDEPENDENT_AMBULATORY_CARE_PROVIDER_SITE_OTHER): Payer: BC Managed Care – PPO

## 2014-11-12 DIAGNOSIS — R52 Pain, unspecified: Secondary | ICD-10-CM

## 2014-11-12 DIAGNOSIS — S63501A Unspecified sprain of right wrist, initial encounter: Secondary | ICD-10-CM

## 2014-11-12 DIAGNOSIS — M25531 Pain in right wrist: Secondary | ICD-10-CM

## 2014-11-12 NOTE — ED Notes (Signed)
Reports falling 4 days ago and catching herself with hands and bending right wrist. Thought it was minor, but soreness has persisted.

## 2014-11-12 NOTE — ED Provider Notes (Signed)
CSN: [id number]     Arrival date & time 11/12/14  1254 History   First MD Initiated Contact with Patient 11/12/14 1422     Chief Complaint  Patient presents with  . Wrist Pain   (Consider location/radiation/quality/duration/timing/severity/associated sxs/prior Treatment) Patient is a 39 y.o. female presenting with wrist pain. The history is provided by the patient. No language interpreter was used.  Wrist Pain This is a new problem. The current episode started in the past 7 days. The problem occurs constantly. The problem has been gradually worsening. Associated symptoms include joint swelling and myalgias. Pertinent negatives include no numbness. Nothing aggravates the symptoms. She has tried nothing for the symptoms. The treatment provided moderate relief.  Pt complains of swelling and pain after falling and hitting wrist.   Past Medical History  Diagnosis Date  . Chicken pox as a child  . Polycystic ovarian syndrome 8-12  . Asthma     cough variant  . Allergic state 12/18/2008    Qualifier: Diagnosis of  By: [name], [name]     Past Surgical History  Procedure Laterality Date  . Knee surgery  12-94, 10-07    X 2 left  . Wisdom tooth extraction  2001   Family History  Problem Relation Age of Onset  . Diabetes Mother     type 2  . Hypertension Mother   . Diabetes Father     type 2- off of everything since gastric bypass  . Mental illness Sister     bipolar, ADHD  . Mental illness Maternal Grandmother     manic and bipolar  . Stroke Maternal Grandmother   . Alcohol abuse Maternal Grandmother   . Heart disease Maternal Grandfather   . Heart attack Maternal Grandfather   . Diabetes Paternal Grandmother     type 2  . Stroke Paternal Grandmother   . Thyroid disease Paternal Grandmother   . Stroke Paternal Grandfather   . Heart attack Paternal Grandfather   . Cancer Maternal Aunt     breast  . Cancer Maternal Aunt     breast    History  Substance Use Topics  .  Smoking status: Never Smoker   . Smokeless tobacco: Never Used  . Alcohol Use: No   OB History    Gravida Para Term Preterm AB TAB SAB Ectopic Multiple Living   0              Review of Systems  Musculoskeletal: Positive for myalgias and joint swelling.  Neurological: Negative for numbness.  All other systems reviewed and are negative.   Allergies  Eggs or egg-derived products; Latex; Penicillins; and Sulfa antibiotics  Home Medications   Prior to Admission medications   Medication Sig Start Date End Date Taking? Authorizing Provider  albuterol (PROVENTIL HFA;VENTOLIN HFA) 108 (90 BASE) MCG/ACT inhaler Inhale 2 puffs into the lungs every 6 (six) hours as needed for wheezing. 08/24/13   Bradd CanaryStacey A Blyth, MD  calcium carbonate (OS-CAL) 600 MG TABS Take 600 mg by mouth daily.    Historical Provider, MD  metFORMIN (GLUCOPHAGE XR) 500 MG 24 hr tablet Take 1 tablet (500 mg total) by mouth daily with breakfast. 10/02/14   Lauro FranklinPatricia Rolen-Grubb, FNP  Pediatric Multiple Vitamins (FLINTSTONES MULTIVITAMIN PO) Take 2 tablets by mouth.    Historical Provider, MD   BP 128/71 mmHg  Pulse 81  Temp(Src) 97.8 F (36.6 C) (Oral)  Ht 4\' 11"  (1.499 m)  Wt 207 lb (93.895 kg)  BMI 41.79  kg/m2  SpO2 99% Physical Exam  Constitutional: She appears well-developed and well-nourished.  HENT:  Head: Normocephalic.  Musculoskeletal: She exhibits tenderness.  Swollen wrist decreased range of motion  nv and ns intact  Neurological: She is alert.  Skin: Skin is warm.  Nursing note and vitals reviewed.   ED Course  Procedures (including critical care time) Labs Review Labs Reviewed - No data to display  Imaging Review Dg Wrist Complete Right  11/12/2014   CLINICAL DATA:  Larey Seat onto right hand 5 days ago was wrist pain initial evaluation  EXAM: RIGHT WRIST - COMPLETE 3+ VIEW  COMPARISON:  None.  FINDINGS: There is no evidence of fracture or dislocation. There is no evidence of arthropathy or other focal  bone abnormality. Soft tissues are unremarkable.  IMPRESSION: Negative.   Electronically Signed   By: Esperanza Heir M.D.   On: 11/12/2014 15:16     MDM   1. Wrist sprain, right, initial encounter   2. Pain    Pt placed in a splint.  Pt advised to follow up in one week for recheck if pain persist.  avs    Elson Areas, PA-C 11/13/14 1820

## 2014-11-12 NOTE — Discharge Instructions (Signed)

## 2014-12-26 ENCOUNTER — Telehealth: Payer: Self-pay | Admitting: Nurse Practitioner

## 2014-12-26 NOTE — Telephone Encounter (Signed)
Left message regarding upcoming appointment has been canceled and needs to be rescheduled. °

## 2015-06-05 ENCOUNTER — Ambulatory Visit: Payer: BC Managed Care – PPO | Admitting: Family Medicine

## 2015-06-05 ENCOUNTER — Telehealth: Payer: Self-pay | Admitting: Family Medicine

## 2015-06-07 NOTE — Telephone Encounter (Signed)
Yes chargeor

## 2015-06-07 NOTE — Telephone Encounter (Signed)
Pt was no show 06/05/15 6:15pm, acute appt, pt has not rescheduled, charge for no show?

## 2015-08-30 ENCOUNTER — Encounter: Payer: Self-pay | Admitting: Family Medicine

## 2015-08-30 ENCOUNTER — Ambulatory Visit (INDEPENDENT_AMBULATORY_CARE_PROVIDER_SITE_OTHER): Payer: BC Managed Care – PPO | Admitting: Family Medicine

## 2015-08-30 VITALS — BP 118/76 | HR 80 | Temp 97.9°F | Resp 16 | Wt 204.6 lb

## 2015-08-30 DIAGNOSIS — J4521 Mild intermittent asthma with (acute) exacerbation: Secondary | ICD-10-CM | POA: Diagnosis not present

## 2015-08-30 MED ORDER — ALBUTEROL SULFATE (2.5 MG/3ML) 0.083% IN NEBU
2.5000 mg | INHALATION_SOLUTION | Freq: Once | RESPIRATORY_TRACT | Status: AC
  Administered 2015-08-30: 2.5 mg via RESPIRATORY_TRACT

## 2015-08-30 MED ORDER — BECLOMETHASONE DIPROPIONATE 40 MCG/ACT IN AERS: 2.0000 | INHALATION_SPRAY | Freq: Two times a day (BID) | RESPIRATORY_TRACT | Status: DC

## 2015-08-30 MED ORDER — ALBUTEROL SULFATE HFA 108 (90 BASE) MCG/ACT IN AERS: 2.0000 | INHALATION_SPRAY | Freq: Four times a day (QID) | RESPIRATORY_TRACT | Status: DC | PRN

## 2015-08-30 MED ORDER — PREDNISONE 10 MG PO TABS: ORAL_TABLET | ORAL | Status: DC

## 2015-08-30 NOTE — Progress Notes (Signed)
Patient ID: Veronica HearingElizabeth H Stokes, female    DOB: [birth year]  Age: 39 y.o. MRN: [id number]    Subjective:  Subjective HPI Veronica Hearinglizabeth H Staib presents for exacerbation of her asthma. She is using proair 3-4 x a day for last several days.  Dry hacky cough.  No fever. No congestion.     Review of Systems  Constitutional: Negative for fever and chills.  HENT: Positive for rhinorrhea and sinus pressure. Negative for congestion and postnasal drip.   Respiratory: Positive for cough, chest tightness and shortness of breath. Negative for wheezing.   Cardiovascular: Negative for chest pain, palpitations and leg swelling.  Allergic/Immunologic: Negative for environmental allergies.    History Past Medical History  Diagnosis Date  . Chicken pox as a child  . Polycystic ovarian syndrome 8-12  . Asthma     cough variant  . Allergic state 12/18/2008    Qualifier: Diagnosis of  By: [name], [name]      She has past surgical history that includes Knee surgery (12-94, 10-07) and Wisdom tooth extraction (2001).   Her family history includes Alcohol abuse in her maternal grandmother; Cancer in her maternal aunt and maternal aunt; Diabetes in her father, mother, and paternal grandmother; Heart attack in her maternal grandfather and paternal grandfather; Heart disease in her maternal grandfather; Hypertension in her mother; Mental illness in her maternal grandmother and sister; Stroke in her maternal grandmother, paternal grandfather, and paternal grandmother; Thyroid disease in her paternal grandmother.She reports that she has never smoked. She has never used smokeless tobacco. She reports that she does not drink alcohol or use illicit drugs.  Current Outpatient Prescriptions on File Prior to Visit  Medication Sig Dispense Refill  . calcium carbonate (OS-CAL) 600 MG TABS Take 600 mg by mouth daily.    . Pediatric Multiple Vitamins (FLINTSTONES MULTIVITAMIN PO) Take 2 tablets by mouth.    .  metFORMIN (GLUCOPHAGE XR) 500 MG 24 hr tablet Take 1 tablet (500 mg total) by mouth daily with breakfast. (Patient not taking: Reported on 08/30/2015) 30 tablet 3   No current facility-administered medications on file prior to visit.     Objective:  Objective Physical Exam  Constitutional: She is oriented to person, place, and time. She appears well-developed and well-nourished.  HENT:  Right Ear: External ear normal.  Left Ear: External ear normal.  + PND + errythema  Eyes: Conjunctivae are normal. Right eye exhibits no discharge. Left eye exhibits no discharge.  Cardiovascular: Normal rate, regular rhythm and normal heart sounds.   No murmur heard. Pulmonary/Chest: Effort normal. No respiratory distress. She has decreased breath sounds. She has no wheezes. She has no rales. She exhibits no tenderness.  Musculoskeletal: She exhibits no edema.  Lymphadenopathy:    She has cervical adenopathy.  Neurological: She is alert and oriented to person, place, and time.   BP 118/76 mmHg  Pulse 80  Temp(Src) 97.9 F (36.6 C) (Oral)  Resp 16  Wt 204 lb 9.6 oz (92.806 kg)  SpO2 99%  LMP 07/27/2015 (Approximate) Wt Readings from Last 3 Encounters:  08/30/15 204 lb 9.6 oz (92.806 kg)  11/12/14 207 lb (93.895 kg)  10/19/14 207 lb 8 oz (94.121 kg)     Lab Results  Component Value Date   WBC 6.7 06/28/2010   HGB 13.0 06/28/2010   HCT 37.6 06/28/2010   PLT 296.0 06/28/2010   GLUCOSE 82 06/28/2010   ALT 22 06/08/2009   AST 21 06/08/2009   NA 139 06/28/2010  K 4.5 06/28/2010   CL 104 06/28/2010   CREATININE 0.7 06/28/2010   BUN 12 06/28/2010   CO2 29 06/28/2010   TSH 2.100 08/02/2014    Dg Wrist Complete Right  11/12/2014  CLINICAL DATA:  Larey Seat onto right hand 5 days ago was wrist pain initial evaluation EXAM: RIGHT WRIST - COMPLETE 3+ VIEW COMPARISON:  None. FINDINGS: There is no evidence of fracture or dislocation. There is no evidence of arthropathy or other focal bone  abnormality. Soft tissues are unremarkable. IMPRESSION: Negative. Electronically Signed   By: Esperanza Heir M.D.   On: 11/12/2014 15:16     Assessment & Plan:  Plan I am having Ms. Schuknecht start on predniSONE and beclomethasone. I am also having her maintain her calcium carbonate, Pediatric Multiple Vitamins (FLINTSTONES MULTIVITAMIN PO), metFORMIN, and albuterol. We administered albuterol.  Meds ordered this encounter  Medications  . predniSONE (DELTASONE) 10 MG tablet    Sig: 3 po qd for 3 days then 2 po qd for 3 days the 1 po qd for 3 days    Dispense:  18 tablet    Refill:  0  . albuterol (PROVENTIL HFA;VENTOLIN HFA) 108 (90 BASE) MCG/ACT inhaler    Sig: Inhale 2 puffs into the lungs every 6 (six) hours as needed for wheezing.    Dispense:  18 g    Refill:  1  . beclomethasone (QVAR) 40 MCG/ACT inhaler    Sig: Inhale 2 puffs into the lungs 2 (two) times daily.    Dispense:  1 Inhaler    Refill:  12  . albuterol (PROVENTIL) (2.5 MG/3ML) 0.083% nebulizer solution 2.5 mg    Sig:     Problem List Items Addressed This Visit      Unprioritized   Asthma   Relevant Medications   predniSONE (DELTASONE) 10 MG tablet   albuterol (PROVENTIL HFA;VENTOLIN HFA) 108 (90 BASE) MCG/ACT inhaler   beclomethasone (QVAR) 40 MCG/ACT inhaler   albuterol (PROVENTIL) (2.5 MG/3ML) 0.083% nebulizer solution 2.5 mg (Completed)    Other Visit Diagnoses    Asthma with exacerbation, mild intermittent    -  Primary    Relevant Medications    predniSONE (DELTASONE) 10 MG tablet    albuterol (PROVENTIL HFA;VENTOLIN HFA) 108 (90 BASE) MCG/ACT inhaler    beclomethasone (QVAR) 40 MCG/ACT inhaler    albuterol (PROVENTIL) (2.5 MG/3ML) 0.083% nebulizer solution 2.5 mg (Completed)       Follow-up: Return if symptoms worsen or fail to improve.  Loreen Freud, DO

## 2015-08-30 NOTE — Progress Notes (Signed)
Pre visit review using our clinic review tool, if applicable. No additional management support is needed unless otherwise documented below in the visit note. 

## 2015-08-30 NOTE — Patient Instructions (Signed)
Asthma Attack Prevention While you may not be able to control the fact that you have asthma, you can take actions to prevent asthma attacks. The best way to prevent asthma attacks is to maintain good control of your asthma. You can achieve this by:  Taking your medicines as directed.  Avoiding things that can irritate your airways or make your asthma symptoms worse (asthma triggers).  Keeping track of how well your asthma is controlled and of any changes in your symptoms.  Responding quickly to worsening asthma symptoms (asthma attack).  Seeking emergency care when it is needed. WHAT ARE SOME WAYS TO PREVENT AN ASTHMA ATTACK? Have a Plan Work with your health care provider to create a written plan for managing and treating your asthma attacks (asthma action plan). This plan includes:  A list of your asthma triggers and how you can avoid them.  Information on when medicines should be taken and when their dosages should be changed.  The use of a device that measures how well your lungs are working (peak flow meter). Monitor Your Asthma Use your peak flow meter and record your results in a journal every day. A drop in your peak flow numbers on one or more days may indicate the start of an asthma attack. This can happen even before you start to feel symptoms. You can prevent an asthma attack from getting worse by following the steps in your asthma action plan. Avoid Asthma Triggers Work with your asthma health care provider to find out what your asthma triggers are. This can be done by:  Allergy testing.  Keeping a journal that notes when asthma attacks occur and the factors that may have contributed to them.  Determining if there are other medical conditions that are making your asthma worse. Once you have determined your asthma triggers, take steps to avoid them. This may include avoiding excessive or prolonged exposure to:  Dust. Have someone dust and vacuum your home for you once or  twice a week. Using a high-efficiency particulate arrestance (HEPA) vacuum is best.  Smoke. This includes campfire smoke, forest fire smoke, and secondhand smoke from tobacco products.  Pet dander. Avoid contact with animals that you know you are allergic to.  Allergens from trees, grasses or pollens. Avoid spending a lot of time outdoors when pollen counts are high, and on very windy days.  Very cold, dry, or humid air.  Mold.  Foods that contain high amounts of sulfites.  Strong odors.  Outdoor air pollutants, such as engine exhaust.  Indoor air pollutants, such as aerosol sprays and fumes from household cleaners.  Household pests, including dust mites and cockroaches, and pest droppings.  Certain medicines, including NSAIDs. Always talk to your health care provider before stopping or starting any new medicines. Medicines Take over-the-counter and prescription medicines only as told by your health care provider. Many asthma attacks can be prevented by carefully following your medicine schedule. Taking your medicines correctly is especially important when you cannot avoid certain asthma triggers. Act Quickly If an asthma attack does happen, acting quickly can decrease how severe it is and how long it lasts. Take these steps:   Pay attention to your symptoms. If you are coughing, wheezing, or having difficulty breathing, do not wait to see if your symptoms go away on their own. Follow your asthma action plan.  If you have followed your asthma action plan and your symptoms are not improving, call your health care provider or seek immediate medical care   at the nearest hospital. It is important to note how often you need to use your fast-acting rescue inhaler. If you are using your rescue inhaler more often, it may mean that your asthma is not under control. Adjusting your asthma treatment plan may help you to prevent future asthma attacks and help you to gain better control of your  condition. HOW CAN I PREVENT AN ASTHMA ATTACK WHEN I EXERCISE? Follow advice from your health care provider about whether you should use your fast-acting inhaler before exercising. Many people with asthma experience exercise-induced bronchoconstriction (EIB). This condition often worsens during vigorous exercise in cold, humid, or dry environments. Usually, people with EIB can stay very active by pre-treating with a fast-acting inhaler before exercising.   This information is not intended to replace advice given to you by your health care provider. Make sure you discuss any questions you have with your health care provider.   Document Released: 08/13/2009 Document Revised: 05/16/2015 Document Reviewed: 01/25/2015 [employer] Interactive Patient Education 2016 [employer]  

## 2015-09-05 ENCOUNTER — Encounter: Payer: Self-pay | Admitting: Family Medicine

## 2015-09-05 ENCOUNTER — Ambulatory Visit (INDEPENDENT_AMBULATORY_CARE_PROVIDER_SITE_OTHER): Payer: BC Managed Care – PPO | Admitting: Family Medicine

## 2015-09-05 VITALS — BP 138/89 | HR 89 | Temp 97.8°F | Resp 16 | Ht 59.0 in | Wt 204.0 lb

## 2015-09-05 DIAGNOSIS — B370 Candidal stomatitis: Secondary | ICD-10-CM | POA: Diagnosis not present

## 2015-09-05 DIAGNOSIS — J069 Acute upper respiratory infection, unspecified: Secondary | ICD-10-CM

## 2015-09-05 DIAGNOSIS — Z888 Allergy status to other drugs, medicaments and biological substances status: Secondary | ICD-10-CM

## 2015-09-05 DIAGNOSIS — J45991 Cough variant asthma: Secondary | ICD-10-CM | POA: Insufficient documentation

## 2015-09-05 MED ORDER — NYSTATIN 100000 UNIT/ML MT SUSP: OROMUCOSAL | Status: DC

## 2015-09-05 MED ORDER — ALBUTEROL SULFATE (2.5 MG/3ML) 0.083% IN NEBU: 2.5000 mg | INHALATION_SOLUTION | RESPIRATORY_TRACT | Status: AC | PRN

## 2015-09-05 MED ORDER — IPRATROPIUM BROMIDE 0.02 % IN SOLN
0.5000 mg | Freq: Once | RESPIRATORY_TRACT | Status: AC
  Administered 2015-09-05: 0.5 mg via RESPIRATORY_TRACT

## 2015-09-05 NOTE — Progress Notes (Signed)
OFFICE VISIT  09/05/2015   CC:  Chief Complaint  Patient presents with  . Asthma   HPI:    Patient is a 39 y.o. Caucasian female who presents for respiratory complaints. Recent URI sx's for about 2 wks,  Chest tightness, wheezing some, some coughing of clear mucous. Pt was seen by Dr. Laury Axon 08/30/15 and rx'd prednisone taper and QVAR--didn't start QVAR yet. Soon after starting this she noted acute feeling of difficulty swallowing, SOB/chest heaviness,itching--she was seen by an MD at her mom's place of employment ([employer]) and ultimately dx'd with allergy to prednisone.  Pt reports similar past reactions to injectable steroids.  This is the first time a dose pack has caused her to react this way.   An albuterol neb in office 08/30/15 helped a lot.    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  Past Medical History  Diagnosis Date  . Chicken pox as a child  . Polycystic ovarian syndrome 8-12  . Asthma     cough variant  . Allergic state 12/18/2008    Qualifier: Diagnosis of  By: Beverely Low MD, Natalia Leatherwood      Past Surgical History  Procedure Laterality Date  . Knee surgery  12-94, 10-07    X 2 left  . Wisdom tooth extraction  2001   MEDS: not taking any of the steroids listed below Outpatient Prescriptions Prior to Visit  Medication Sig Dispense Refill  . albuterol (PROVENTIL HFA;VENTOLIN HFA) 108 (90 BASE) MCG/ACT inhaler Inhale 2 puffs into the lungs every 6 (six) hours as needed for wheezing. 18 g 1  . calcium carbonate (OS-CAL) 600 MG TABS Take 600 mg by mouth daily.    . Pediatric Multiple Vitamins (FLINTSTONES MULTIVITAMIN PO) Take 2 tablets by mouth.    . beclomethasone (QVAR) 40 MCG/ACT inhaler Inhale 2 puffs into the lungs 2 (two) times daily. 1 Inhaler 12  . metFORMIN (GLUCOPHAGE XR) 500 MG 24 hr tablet Take 1 tablet (500 mg total) by mouth daily with breakfast. (Patient not taking: Reported on 08/30/2015) 30 tablet 3  . predniSONE (DELTASONE) 10 MG  tablet 3 po qd for 3 days then 2 po qd for 3 days the 1 po qd for 3 days 18 tablet 0   No facility-administered medications prior to visit.    Allergies  Allergen Reactions  . Corticosteroids Anaphylaxis  . Eggs Or Egg-Derived Products   . Latex   . Penicillins     REACTION: SWELLING  . Sulfa Antibiotics Itching    ROS As per HPI  PE: Blood pressure 138/89, pulse 89, temperature 97.8 F (36.6 C), temperature source Oral, resp. rate 16, height  (1.499 m), weight 204 lb (92.534 kg), last menstrual period 09/02/2015, SpO2 96 %. VS: noted--normal. Gen: alert, NAD, NONTOXIC APPEARING. HEENT: eyes without injection, drainage, or swelling.  Ears: EACs clear, TMs with normal light reflex and landmarks.  Nose: Clear rhinorrhea, with some dried, crusty exudate adherent to mildly injected mucosa.  No purulent d/c.  No paranasal sinus TTP.  No facial swelling.  Throat and mouth without focal lesion but tongue has white film covering it, able to scrape some of this off with tongue depressor.  No pharyngial swelling, erythema, or exudate.   Neck: supple, no LAD.   LUNGS: CTA bilat except trace soft rhonchi at end of inspiration at bases, no wheezing or prolongation of exp phase, nonlabored resps.  No crackles. CV: RRR, no m/r/g. EXT: no c/c/e SKIN: no rash  LABS:  None  Peak flows pre-neb in office: 280, 240, 260 Peak flows POST neb in office: 380,330, 340  IMPRESSION AND PLAN:  1) Cough variant asthma, mild acute flare.  Viral URI trigger. Unfortunately, it appears she has a steroid allergy so I'm unable to give any today. She will continue with albuterol 2.5mg  nebs q4h prn--neb machine dispensed to pt today while here at office. She felt significant improvement after alb/atr neb in office today, peak flows improved significantly. Refer to allergy/immunology.  2) Systemic steroid allergy: patient is absolutely sure that her reaction after taking systemic steroids is consistently  the same.  It was initially only with larger doses given in IM form or if injected into a joint, but recently she had same symptoms with a 20mg  dose of prednisone. Refer to allergy/immunology.  3) Oral thrush: nystatin suspension 5 ml swish, gargle, swallow x 12d.  An After Visit Summary was printed and given to the patient.  FOLLOW UP: Return if symptoms worsen or fail to improve.

## 2015-09-05 NOTE — Progress Notes (Signed)
Pre visit review using our clinic review tool, if applicable. No additional management support is needed unless otherwise documented below in the visit note. 

## 2015-09-07 ENCOUNTER — Encounter: Payer: Self-pay | Admitting: Family Medicine

## 2015-09-12 ENCOUNTER — Emergency Department (HOSPITAL_BASED_OUTPATIENT_CLINIC_OR_DEPARTMENT_OTHER): Payer: BC Managed Care – PPO

## 2015-09-12 ENCOUNTER — Encounter: Payer: Self-pay | Admitting: Family Medicine

## 2015-09-12 ENCOUNTER — Emergency Department (HOSPITAL_BASED_OUTPATIENT_CLINIC_OR_DEPARTMENT_OTHER)
Admission: EM | Admit: 2015-09-12 | Discharge: 2015-09-12 | Disposition: A | Discharge status: Home / Self Care | Payer: BC Managed Care – PPO | Attending: Emergency Medicine | Admitting: Emergency Medicine

## 2015-09-12 ENCOUNTER — Ambulatory Visit (INDEPENDENT_AMBULATORY_CARE_PROVIDER_SITE_OTHER): Payer: BC Managed Care – PPO | Admitting: Family Medicine

## 2015-09-12 ENCOUNTER — Telehealth: Payer: Self-pay

## 2015-09-12 ENCOUNTER — Encounter (HOSPITAL_BASED_OUTPATIENT_CLINIC_OR_DEPARTMENT_OTHER): Payer: Self-pay | Admitting: Emergency Medicine

## 2015-09-12 VITALS — BP 156/90 | HR 125 | Temp 98.1°F | Resp 18 | Ht 59.0 in | Wt 201.2 lb

## 2015-09-12 DIAGNOSIS — Z91040 Latex allergy status: Secondary | ICD-10-CM | POA: Diagnosis not present

## 2015-09-12 DIAGNOSIS — J4541 Moderate persistent asthma with (acute) exacerbation: Secondary | ICD-10-CM

## 2015-09-12 DIAGNOSIS — R000 Tachycardia, unspecified: Secondary | ICD-10-CM | POA: Diagnosis not present

## 2015-09-12 DIAGNOSIS — Z8639 Personal history of other endocrine, nutritional and metabolic disease: Secondary | ICD-10-CM | POA: Insufficient documentation

## 2015-09-12 DIAGNOSIS — R05 Cough: Secondary | ICD-10-CM

## 2015-09-12 DIAGNOSIS — R030 Elevated blood-pressure reading, without diagnosis of hypertension: Secondary | ICD-10-CM

## 2015-09-12 DIAGNOSIS — R059 Cough, unspecified: Secondary | ICD-10-CM

## 2015-09-12 DIAGNOSIS — Z8619 Personal history of other infectious and parasitic diseases: Secondary | ICD-10-CM | POA: Insufficient documentation

## 2015-09-12 DIAGNOSIS — J4521 Mild intermittent asthma with (acute) exacerbation: Secondary | ICD-10-CM

## 2015-09-12 DIAGNOSIS — R0602 Shortness of breath: Secondary | ICD-10-CM | POA: Diagnosis present

## 2015-09-12 DIAGNOSIS — Z880 Allergy status to penicillin: Secondary | ICD-10-CM | POA: Insufficient documentation

## 2015-09-12 DIAGNOSIS — Z79899 Other long term (current) drug therapy: Secondary | ICD-10-CM | POA: Insufficient documentation

## 2015-09-12 DIAGNOSIS — IMO0001 Reserved for inherently not codable concepts without codable children: Secondary | ICD-10-CM

## 2015-09-12 MED ORDER — IPRATROPIUM-ALBUTEROL 0.5-2.5 (3) MG/3ML IN SOLN
3.0000 mL | Freq: Once | RESPIRATORY_TRACT | Status: AC
  Administered 2015-09-12: 3 mL via RESPIRATORY_TRACT

## 2015-09-12 MED ORDER — FAMOTIDINE 20 MG PO TABS: ORAL_TABLET | ORAL | Status: DC

## 2015-09-12 MED ORDER — IPRATROPIUM-ALBUTEROL 0.5-2.5 (3) MG/3ML IN SOLN
3.0000 mL | Freq: Four times a day (QID) | RESPIRATORY_TRACT | Status: DC
  Administered 2015-09-12: 3 mL via RESPIRATORY_TRACT
  Filled 2015-09-12: qty 3

## 2015-09-12 MED ORDER — GUAIFENESIN-CODEINE 100-10 MG/5ML PO SOLN
5.0000 mL | ORAL | Status: DC | PRN
Start: 2015-09-12 — End: 2015-10-22

## 2015-09-12 MED ORDER — BENZONATATE 100 MG PO CAPS
200.0000 mg | ORAL_CAPSULE | Freq: Once | ORAL | Status: AC
  Administered 2015-09-12: 200 mg via ORAL
  Filled 2015-09-12: qty 2

## 2015-09-12 NOTE — Patient Instructions (Signed)
Increase your claritin to 10mg  TWICE per day. Continue otc chlortrimeton as directed on packaging (one dose every 4 hours while awake).

## 2015-09-12 NOTE — ED Notes (Signed)
Pt reports frequent cough and SOB albuteral tx  At 2030 and midnight but remains SOB

## 2015-09-12 NOTE — Discharge Instructions (Signed)
Asthma, Adult Asthma is a recurring condition in which the airways tighten and narrow. Asthma can make it difficult to breathe. It can cause coughing, wheezing, and shortness of breath. Asthma episodes, also called asthma attacks, range from minor to life-threatening. Asthma cannot be cured, but medicines and lifestyle changes can help control it. CAUSES Asthma is believed to be caused by inherited (genetic) and environmental factors, but its exact cause is unknown. Asthma may be triggered by allergens, lung infections, or irritants in the air. Asthma triggers are different for each person. Common triggers include:   Animal dander.  Dust mites.  Cockroaches.  Pollen from trees or grass.  Mold.  Smoke.  Air pollutants such as dust, household cleaners, hair sprays, aerosol sprays, paint fumes, strong chemicals, or strong odors.  Cold air, weather changes, and winds (which increase molds and pollens in the air).  Strong emotional expressions such as crying or laughing hard.  Stress.  Certain medicines (such as aspirin) or types of drugs (such as beta-blockers).  Sulfites in foods and drinks. Foods and drinks that may contain sulfites include dried fruit, potato chips, and sparkling grape juice.  Infections or inflammatory conditions such as the flu, a cold, or an inflammation of the nasal membranes (rhinitis).  Gastroesophageal reflux disease (GERD).  Exercise or strenuous activity. SYMPTOMS Symptoms may occur immediately after asthma is triggered or many hours later. Symptoms include:  Wheezing.  Excessive nighttime or early morning coughing.  Frequent or severe coughing with a common cold.  Chest tightness.  Shortness of breath. DIAGNOSIS  The diagnosis of asthma is made by a review of your medical history and a physical exam. Tests may also be performed. These may include:  Lung function studies. These tests show how much air you breathe in and out.  Allergy  tests.  Imaging tests such as X-rays. TREATMENT  Asthma cannot be cured, but it can usually be controlled. Treatment involves identifying and avoiding your asthma triggers. It also involves medicines. There are 2 classes of medicine used for asthma treatment:   Controller medicines. These prevent asthma symptoms from occurring. They are usually taken every day.  Reliever or rescue medicines. These quickly relieve asthma symptoms. They are used as needed and provide short-term relief. Your health care provider will help you create an asthma action plan. An asthma action plan is a written plan for managing and treating your asthma attacks. It includes a list of your asthma triggers and how they may be avoided. It also includes information on when medicines should be taken and when their dosage should be changed. An action plan may also involve the use of a device called a peak flow meter. A peak flow meter measures how well the lungs are working. It helps you monitor your condition. HOME CARE INSTRUCTIONS   Take medicines only as directed by your health care provider. Speak with your health care provider if you have questions about how or when to take the medicines.  Use a peak flow meter as directed by your health care provider. Record and keep track of readings.  Understand and use the action plan to help minimize or stop an asthma attack without needing to seek medical care.  Control your home environment in the following ways to help prevent asthma attacks:  Do not smoke. Avoid being exposed to secondhand smoke.  Change your heating and air conditioning filter regularly.  Limit your use of fireplaces and wood stoves.  Get rid of pests (such as roaches   and mice) and their droppings.  Throw away plants if you see mold on them.  Clean your floors and dust regularly. Use unscented cleaning products.  Try to have someone else vacuum for you regularly. Stay out of rooms while they are  being vacuumed and for a short while afterward. If you vacuum, use a dust mask from a hardware store, a double-layered or microfilter vacuum cleaner bag, or a vacuum cleaner with a HEPA filter.  Replace carpet with wood, tile, or vinyl flooring. Carpet can trap dander and dust.  Use allergy-proof pillows, mattress covers, and box spring covers.  Wash bed sheets and blankets every week in hot water and dry them in a dryer.  Use blankets that are made of polyester or cotton.  Clean bathrooms and kitchens with bleach. If possible, have someone repaint the walls in these rooms with mold-resistant paint. Keep out of the rooms that are being cleaned and painted.  Wash hands frequently. SEEK MEDICAL CARE IF:   You have wheezing, shortness of breath, or a cough even if taking medicine to prevent attacks.  The colored mucus you cough up (sputum) is thicker than usual.  Your sputum changes from clear or white to yellow, green, gray, or bloody.  You have any problems that may be related to the medicines you are taking (such as a rash, itching, swelling, or trouble breathing).  You are using a reliever medicine more than 2-3 times per week.  Your peak flow is still at 50-79% of your personal best after following your action plan for 1 hour.  You have a fever. SEEK IMMEDIATE MEDICAL CARE IF:   You seem to be getting worse and are unresponsive to treatment during an asthma attack.  You are short of breath even at rest.  You get short of breath when doing very little physical activity.  You have difficulty eating, drinking, or talking due to asthma symptoms.  You develop chest pain.  You develop a fast heartbeat.  You have a bluish color to your lips or fingernails.  You are light-headed, dizzy, or faint.  Your peak flow is less than 50% of your personal best.   This information is not intended to replace advice given to you by your health care provider. Make sure you discuss any  questions you have with your health care provider.   Document Released: 08/25/2005 Document Revised: 05/16/2015 Document Reviewed: 03/24/2013 Elsevier Interactive Patient Education 2016 [employer]  

## 2015-09-12 NOTE — Progress Notes (Signed)
Patient ambulated around the department while on pulse ox.  Patient's SPO2 remained between 91% and 97%.

## 2015-09-12 NOTE — Telephone Encounter (Signed)
Call transferred from front desk staff, [phone].  Pt's mother on the phone.  Mother called in requesting that patient be seen today.  States pt was seen in the ER last night and was told that she needs to follow up with her PCP today.  Mother states that symptoms have not improved much.  States pt still coughs so much that she either vomits or wets herself.  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  Mother would like patient to be seen by one of our providers, but does not want patient to see Dr. Laury AxonLowne (only provider with openings at this time).    Called and spoke to daughter.  Daughter states that she feels "So-so," but not worse than she did whenever she went to the  ER last night.  She is also requesting to be seen today.  She has been taking neb treatments and is not currently experiencing wheezing or shortness of breath, but states that her chief complaint is her cough.  She says that sometimes she coughs so hard that she "either vomits or pees."  She says that before the cough was nonstop, but has improved within the last 30 mins with Chlor-Trimeton.  Advise: Appt scheduled with Dr. Milinda CaveMcGowen at 3:30 pm.  If symptoms worsen, go to the ER.

## 2015-09-12 NOTE — Progress Notes (Signed)
Pre visit review using our clinic review tool, if applicable. No additional management support is needed unless otherwise documented below in the visit note. 

## 2015-09-12 NOTE — ED Provider Notes (Signed)
CSN: [id number]     Arrival date & time 09/12/15  0203 History   First MD Initiated Contact with Patient 09/12/15 [phone]     Chief Complaint  Patient presents with  . Shortness of Breath     (Consider location/radiation/quality/duration/timing/severity/associated sxs/prior Treatment) HPI   This is a 40 year old female with a history of asthma, cough variant  Who presents with persistent cough. Patient states that she began coughing last night at 7:30 PM. Coughing progressively worse. She took 2 breathing treatments at home with minimal relief. Patient states that she has had issues since mid December. She has been seen and evaluated multiple times. She is allergic to steroids and reports throat swelling with this. She has previously tolerated oral steroids. She was placed on a Medrol Dosepak on December 22 ; however, developed  Itching and throat swelling requiring Zyrtec and Benadryl. She stopped this course of steroids. She was seen by her doctor on Wednesday. At that time she was diagnosed with thrush and sent home with nystatin. Patient denies any fever. Cough is nonproductive.  Past Medical History  Diagnosis Date  . Chicken pox as a child  . Polycystic ovarian syndrome 8-12  . Asthma     cough variant  . Allergic state 12/18/2008    Qualifier: Diagnosis of  By: Beverely Low MD, Natalia Leatherwood     Past Surgical History  Procedure Laterality Date  . Knee surgery  12-94, 10-07    X 2 left  . Wisdom tooth extraction  2001   Family History  Problem Relation Age of Onset  . Diabetes Mother     type 2  . Hypertension Mother   . Diabetes Father     type 2- off of everything since gastric bypass  . Mental illness Sister     bipolar, ADHD  . Mental illness Maternal Grandmother     manic and bipolar  . Stroke Maternal Grandmother   . Alcohol abuse Maternal Grandmother   . Heart disease Maternal Grandfather   . Heart attack Maternal Grandfather   . Diabetes Paternal Grandmother     type 2  .  Stroke Paternal Grandmother   . Thyroid disease Paternal Grandmother   . Stroke Paternal Grandfather   . Heart attack Paternal Grandfather   . Cancer Maternal Aunt     breast  . Cancer Maternal Aunt     breast    Social History  Substance Use Topics  . Smoking status: Never Smoker   . Smokeless tobacco: Never Used  . Alcohol Use: No   OB History    Gravida Para Term Preterm AB TAB SAB Ectopic Multiple Living   0              Review of Systems  Constitutional: Negative for fever.  Respiratory: Positive for cough and shortness of breath. Negative for chest tightness.   Gastrointestinal: Negative for nausea, vomiting and abdominal pain.  Neurological: Negative for headaches.  All other systems reviewed and are negative.     Allergies  Corticosteroids; Eggs or egg-derived products; Latex; Penicillins; and Sulfa antibiotics  Home Medications   Prior to Admission medications   Medication Sig Start Date End Date Taking? Authorizing Provider  albuterol (PROVENTIL HFA;VENTOLIN HFA) 108 (90 BASE) MCG/ACT inhaler Inhale 2 puffs into the lungs every 6 (six) hours as needed for wheezing. 08/30/15   Lelon Perla, DO  albuterol (PROVENTIL) (2.5 MG/3ML) 0.083% nebulizer solution Take 3 mLs (2.5 mg total) by nebulization every 4 (four) hours  as needed for wheezing or shortness of breath. 09/05/15   Jeoffrey MassedPhilip H McGowen, MD  calcium carbonate (OS-CAL) 600 MG TABS Take 600 mg by mouth daily.    Historical Provider, MD  nystatin (MYCOSTATIN) 100000 UNIT/ML suspension 5 ml po swish, gargle, and swallow qid 09/05/15   Jeoffrey MassedPhilip H McGowen, MD  Pediatric Multiple Vitamins (FLINTSTONES MULTIVITAMIN PO) Take 2 tablets by mouth.    Historical Provider, MD   BP 169/109 mmHg  Pulse 122  Temp(Src) 98.3 F (36.8 C) (Oral)  Resp 24  SpO2 100%  LMP 09/02/2015 Physical Exam  Constitutional: She is oriented to person, place, and time. She appears well-developed and well-nourished.  HENT:  Head:  Normocephalic and atraumatic.  Cardiovascular: Normal rate, regular rhythm and normal heart sounds.   Pulmonary/Chest: Effort normal and breath sounds normal. No respiratory distress.   Persistent coughing while in the room , scant expiratory squeak  Abdominal: Soft. There is no tenderness. There is no rebound.  Neurological: She is alert and oriented to person, place, and time.  Skin: Skin is warm and dry.  Psychiatric: She has a normal mood and affect.  Nursing note and vitals reviewed.   ED Course  Procedures (including critical care time) Labs Review Labs Reviewed - No data to display  Imaging Review Dg Chest 2 View  09/12/2015  CLINICAL DATA:  Acute onset of nonproductive cough and shortness of breath. Initial encounter. EXAM: CHEST  2 VIEW COMPARISON:  Chest radiograph performed 06/25/2006 FINDINGS: The lungs are well-aerated and clear. There is no evidence of focal opacification, pleural effusion or pneumothorax. The heart is borderline normal in size. No acute osseous abnormalities are seen. IMPRESSION: No acute cardiopulmonary process seen. Electronically Signed   By: Roanna RaiderJeffery  Chang M.D.   On: 09/12/2015 05:22   I have personally reviewed and evaluated these images and lab results as part of my medical decision-making.   EKG Interpretation None      MDM   Final diagnoses:  Cough  Asthma, moderate persistent, with acute exacerbation     patient presents with persistent cough. Coughing on exam but otherwise nontoxic. Satting 100% on room air. Does not tolerate steroids. Cough refractory to  Nebs at home.. Patient given Jerilynn Somessalon  Perles and 1 DuoNeb here. Chest x-ray is negative for pneumonia. Patient reported temporary relief of cough but now has recurrence of cough. Discussed with patient prescribing cough medication with codeine for home. At this time my treatment options are limited because of her corticosteroid allergy. Patient stated understanding. She ambulated with  pulse oximetry and maintained her oxygen saturation.  After history, exam, and medical workup I feel the patient has been appropriately medically screened and is safe for discharge home. Pertinent diagnoses were discussed with the patient. Patient was given return precautions.     Shon Batonourtney F Horton, MD 09/12/15 [phone]

## 2015-09-12 NOTE — Progress Notes (Signed)
OFFICE VISIT  09/12/2015   CC:  Chief Complaint  Patient presents with  . Cough    x 1 day   HPI:    Patient is a 40 y.o. Caucasian female who presents for ED f/u for cough: was seen in ED very early this morning and was told to f/u here this afternoon. I saw her 09/05/15 for same complaint.  She improved some from a alb/atr neb in office and I sent her home with a nebulizer + albut neb solution. She is unfortunately allergic to systemic steroids (IM and oral), so we have been unable to rx her steroids to help with her acute exac of cough-variant asthma.  CXR at ED early this morning was normal.  She was d/c'd from ED with rx for guaif/codeine syrup:   Feels constant tickle in throat. OTC Chlortrimeton + guaf/codeine cough syrup helped the best today: 2 hours and then things returned.  Most recent alb neb was 4 hours ago.  As long as cough is not occuring then she doesn't feel any SOB or wheezing or chest tightness.   No rash.  No swelling of lips or tongue.  No eyes swelling.  No fevers.  No n/v/d.  Past Medical History  Diagnosis Date  . Chicken pox as a child  . Polycystic ovarian syndrome 8-12  . Asthma     cough variant  . Allergic state 12/18/2008    Qualifier: Diagnosis of  By: Beverely Low MD, Natalia Leatherwood      Past Surgical History  Procedure Laterality Date  . Knee surgery  12-94, 10-07    X 2 left  . Wisdom tooth extraction  2001    Outpatient Prescriptions Prior to Visit  Medication Sig Dispense Refill  . albuterol (PROVENTIL HFA;VENTOLIN HFA) 108 (90 BASE) MCG/ACT inhaler Inhale 2 puffs into the lungs every 6 (six) hours as needed for wheezing. 18 g 1  . albuterol (PROVENTIL) (2.5 MG/3ML) 0.083% nebulizer solution Take 3 mLs (2.5 mg total) by nebulization every 4 (four) hours as needed for wheezing or shortness of breath. 75 mL 1  . calcium carbonate (OS-CAL) 600 MG TABS Take 600 mg by mouth daily.    [name] guaiFENesin-codeine 100-10 MG/5ML syrup Take 5 mLs by mouth every 4  (four) hours as needed for cough. 120 mL 0  . nystatin (MYCOSTATIN) 100000 UNIT/ML suspension 5 ml po swish, gargle, and swallow qid 240 mL 0  . Pediatric Multiple Vitamins (FLINTSTONES MULTIVITAMIN PO) Take 2 tablets by mouth.     No facility-administered medications prior to visit.    Allergies  Allergen Reactions  . Corticosteroids Anaphylaxis  . Eggs Or Egg-Derived Products   . Latex   . Penicillins     REACTION: SWELLING  . Sulfa Antibiotics Itching    ROS As per HPI  PE: Blood pressure 156/90, pulse 125, temperature 98.1 F (36.7 C), temperature source Oral, resp. rate 18, height 4\' 11"  (1.499 m), weight 201 lb 4 oz (91.286 kg), last menstrual period 09/02/2015, SpO2 91 %. Repeat bp manual was 160/74 Gen: Alert, well appearing.  Patient is oriented to person, place, time, and situation. She has a dry cough much of the time during interview/exam today. ENT: Ears: EACs clear, normal epithelium.  TMs with good light reflex and landmarks bilaterally.  Eyes: no injection, icteris, swelling, or exudate.  EOMI, PERRLA. Nose: no drainage or turbinate edema/swelling.  No injection or focal lesion.  Mouth: lips without lesion/swelling.  Oral mucosa pink and moist.  Dentition  intact and without obvious caries or gingival swelling.  Oropharynx without erythema, exudate, or swelling.   Tongue w/out any white or grey plaque. Neck - No masses or thyromegaly or limitation in range of motion CV: RRR, no m/r/g.   LUNGS: CTA bilat, nonlabored resps, good aeration in all lung fields. EXT: no clubbing, cyanosis, or edema.  Skin - no sores or suspicious lesions or rashes or color changes  LABS:  None today  IMPRESSION AND PLAN:  Cough, most consistent with cough-variant asthma. Alb/atr neb in office today: very minimal subjective improvement with this today. She is moving air well, has no wheeze or signif sign of airway obstruction.  Denies foreign body sensation. Also considering dx of  prolonged allergic rxn (pt denies any new food or potential contact or aeroallergens), but she has no other signs of anaphylaxis at this time.  Plan: maximize antihistamine: take chlortrimeton OTC q4h, increase claritin to 10mg  bid. Also, GER likely contributing to "cyclic" cough at this point, so will add pepcid 20 mg bid for both acid suppression and partial H1 blocking effect. Continue albut 2.5mg  neb q4 h prn at home. As stated before, pt with allergic rxn to systemic steroids so we cannot use this med (pt has appt with allergist in 9d). I don't see any evidence that she is having an anaphylactic rxn currently, so I feel like benefits of epinephrine [place] at this time are outweighed by risks.  An After Visit Summary was printed and given to the patient.  Spent 60 min with pt today, with >50% of this time spent in counseling and care coordination regarding the above problems.  FOLLOW UP: Return in about 1 day (around 09/13/2015) for afternoon appt: f/u cough/asthma.

## 2015-09-12 NOTE — ED Notes (Signed)
C/o cough on set last pm,  Has been having trouble w asthma

## 2015-09-13 ENCOUNTER — Ambulatory Visit (INDEPENDENT_AMBULATORY_CARE_PROVIDER_SITE_OTHER): Payer: BC Managed Care – PPO | Admitting: Family Medicine

## 2015-09-13 ENCOUNTER — Encounter: Payer: Self-pay | Admitting: Family Medicine

## 2015-09-13 VITALS — BP 116/77 | HR 83 | Temp 98.0°F | Resp 16 | Wt 200.8 lb

## 2015-09-13 DIAGNOSIS — J45991 Cough variant asthma: Secondary | ICD-10-CM | POA: Diagnosis not present

## 2015-09-13 NOTE — Progress Notes (Signed)
OFFICE VISIT  09/13/2015   CC:  Chief Complaint  Patient presents with  . Follow-up    Cough, much improved since yesterday    HPI:    Patient is a 40 y.o. Caucasian female who presents for 1 day f/u cough. Cough is MUCH improved, and when she does cough she says it is productive of yellow sputum. No wheezing, SOB, or fever.  She has been taking the meds as we discussed yesterday, has only had to have one alb neb since I saw her, and used her HFA about 6 AM today. The tickle sensation in back of throat is MUCH better.  No rash, no swelling noted anywhere. Some sleepiness is felt from all the antihistamines but no urinary or GI side effects felt.   Past Medical History  Diagnosis Date  . Chicken pox as a child  . Polycystic ovarian syndrome 8-12  . Asthma     cough variant  . Allergic state 12/18/2008    Qualifier: Diagnosis of  By: [name], [name]      Past Surgical History  Procedure Laterality Date  . Knee surgery  12-94, 10-07    X 2 left  . Wisdom tooth extraction  2001   MEDS: claritin 10mg  bid, chlortrimeton 4mg  q4h Outpatient Prescriptions Prior to Visit  Medication Sig Dispense Refill  . albuterol (PROVENTIL HFA;VENTOLIN HFA) 108 (90 BASE) MCG/ACT inhaler Inhale 2 puffs into the lungs every 6 (six) hours as needed for wheezing. 18 g 1  . albuterol (PROVENTIL) (2.5 MG/3ML) 0.083% nebulizer solution Take 3 mLs (2.5 mg total) by nebulization every 4 (four) hours as needed for wheezing or shortness of breath. 75 mL 1  . calcium carbonate (OS-CAL) 600 MG TABS Take 600 mg by mouth daily.    . famotidine (PEPCID) 20 MG tablet 1 tab po bid 30 tablet 1  . guaiFENesin-codeine 100-10 MG/5ML syrup Take 5 mLs by mouth every 4 (four) hours as needed for cough. 120 mL 0  . nystatin (MYCOSTATIN) 100000 UNIT/ML suspension 5 ml po swish, gargle, and swallow qid 240 mL 0  . Pediatric Multiple Vitamins (FLINTSTONES MULTIVITAMIN PO) Take 2 tablets by mouth.     No  facility-administered medications prior to visit.    Allergies  Allergen Reactions  . Corticosteroids Anaphylaxis  . Eggs Or Egg-Derived Products   . Latex   . Penicillins     REACTION: SWELLING  . Sulfa Antibiotics Itching    ROS As per HPI  PE: Blood pressure 116/77, pulse 83, temperature 98 F (36.7 C), temperature source Oral, resp. rate 16, weight 200 lb 12 oz (91.06 kg), last menstrual period 09/02/2015, SpO2 98 %. Gen: Alert, well appearing.  Patient is oriented to person, place, time, and situation. ENT: Ears: EACs clear, normal epithelium.  TMs with good light reflex and landmarks bilaterally.  Eyes: no injection, icteris, swelling, or exudate.  EOMI, PERRLA. Nose: no drainage or turbinate edema/swelling.  No injection or focal lesion.  Mouth: lips without lesion/swelling.  Oral mucosa pink and moist.  Dentition intact and without obvious caries or gingival swelling.  Oropharynx without erythema, exudate, or swelling.  Neck - No masses or thyromegaly or limitation in range of motion CV: RRR, no m/r/g.   LUNGS: CTA bilat, nonlabored resps, good aeration in all lung fields.  LABS:  None today  IMPRESSION AND PLAN:  Cough variant asthma (with allergy to systemic steroids), cough MUCH improved last 24h or so. Continue claritin 10mg  bid, chlortrimeton 4mg  q4h,  pepcid 20mg  bid, and albuterol q4h prn. She'll see the allergist next week to further address her problem with suspected allergy to systemic steroids. No new meds rx'd today.  An After Visit Summary was printed and given to the patient.  FOLLOW UP: Return for keep appt with Dr. Abner Greenspan for 10/2015.

## 2015-09-13 NOTE — Progress Notes (Signed)
Pre visit review using our clinic review tool, if applicable. No additional management support is needed unless otherwise documented below in the visit note. 

## 2015-09-18 ENCOUNTER — Ambulatory Visit: Payer: BC Managed Care – PPO | Admitting: Medical

## 2015-10-04 ENCOUNTER — Ambulatory Visit: Payer: BC Managed Care – PPO | Admitting: Nurse Practitioner

## 2015-10-19 ENCOUNTER — Telehealth: Payer: Self-pay | Admitting: *Deleted

## 2015-10-19 NOTE — Telephone Encounter (Signed)
Unable to reach patient at time of pre-visit call. Left message for patient to return call when available.  

## 2015-10-22 ENCOUNTER — Encounter: Payer: Self-pay | Admitting: Family Medicine

## 2015-10-22 ENCOUNTER — Ambulatory Visit (INDEPENDENT_AMBULATORY_CARE_PROVIDER_SITE_OTHER): Payer: BC Managed Care – PPO | Admitting: Family Medicine

## 2015-10-22 VITALS — BP 132/78 | HR 93 | Temp 98.3°F | Ht 59.0 in | Wt 207.5 lb

## 2015-10-22 DIAGNOSIS — Z0000 Encounter for general adult medical examination without abnormal findings: Secondary | ICD-10-CM

## 2015-10-22 DIAGNOSIS — J45991 Cough variant asthma: Secondary | ICD-10-CM

## 2015-10-22 DIAGNOSIS — J4520 Mild intermittent asthma, uncomplicated: Secondary | ICD-10-CM

## 2015-10-22 DIAGNOSIS — R739 Hyperglycemia, unspecified: Secondary | ICD-10-CM

## 2015-10-22 DIAGNOSIS — E282 Polycystic ovarian syndrome: Secondary | ICD-10-CM

## 2015-10-22 DIAGNOSIS — D649 Anemia, unspecified: Secondary | ICD-10-CM

## 2015-10-22 DIAGNOSIS — R030 Elevated blood-pressure reading, without diagnosis of hypertension: Secondary | ICD-10-CM

## 2015-10-22 DIAGNOSIS — E782 Mixed hyperlipidemia: Secondary | ICD-10-CM

## 2015-10-22 DIAGNOSIS — R252 Cramp and spasm: Secondary | ICD-10-CM

## 2015-10-22 MED ORDER — ACARBOSE 25 MG PO TABS: 25.0000 mg | ORAL_TABLET | Freq: Three times a day (TID) | ORAL | Status: DC

## 2015-10-22 NOTE — Progress Notes (Signed)
Pre visit review using our clinic review tool, if applicable. No additional management support is needed unless otherwise documented below in the visit note. 

## 2015-10-22 NOTE — Patient Instructions (Signed)
Preventive Care for Adults, Female A healthy lifestyle and preventive care can promote health and wellness. Preventive health guidelines for women include the following key practices.  A routine yearly physical is a good way to check with your health care provider about your health and preventive screening. It is a chance to share any concerns and updates on your health and to receive a thorough exam.  Visit your dentist for a routine exam and preventive care every 6 months. Brush your teeth twice a day and floss once a day. Good oral hygiene prevents tooth decay and gum disease.  The frequency of eye exams is based on your age, health, family medical history, use of contact lenses, and other factors. Follow your health care provider's recommendations for frequency of eye exams.  Eat a healthy diet. Foods like vegetables, fruits, whole grains, low-fat dairy products, and lean protein foods contain the nutrients you need without too many calories. Decrease your intake of foods high in solid fats, added sugars, and salt. Eat the right amount of calories for you.Get information about a proper diet from your health care provider, if necessary.  Regular physical exercise is one of the most important things you can do for your health. Most adults should get at least 150 minutes of moderate-intensity exercise (any activity that increases your heart rate and causes you to sweat) each week. In addition, most adults need muscle-strengthening exercises on 2 or more days a week.  Maintain a healthy weight. The body mass index (BMI) is a screening tool to identify possible weight problems. It provides an estimate of body fat based on height and weight. Your health care provider can find your BMI and can help you achieve or maintain a healthy weight.For adults 20 years and older:  A BMI below 18.5 is considered underweight.  A BMI of 18.5 to 24.9 is normal.  A BMI of 25 to 29.9 is considered overweight.  A  BMI of 30 and above is considered obese.  Maintain normal blood lipids and cholesterol levels by exercising and minimizing your intake of saturated fat. Eat a balanced diet with plenty of fruit and vegetables. Blood tests for lipids and cholesterol should begin at age 45 and be repeated every 5 years. If your lipid or cholesterol levels are high, you are over 50, or you are at high risk for heart disease, you may need your cholesterol levels checked more frequently.Ongoing high lipid and cholesterol levels should be treated with medicines if diet and exercise are not working.  If you smoke, find out from your health care provider how to quit. If you do not use tobacco, do not start.  Lung cancer screening is recommended for adults aged 45-80 years who are at high risk for developing lung cancer because of a history of smoking. A yearly low-dose CT scan of the lungs is recommended for people who have at least a 30-pack-year history of smoking and are a current smoker or have quit within the past 15 years. A pack year of smoking is smoking an average of 1 pack of cigarettes a day for 1 year (for example: 1 pack a day for 30 years or 2 packs a day for 15 years). Yearly screening should continue until the smoker has stopped smoking for at least 15 years. Yearly screening should be stopped for people who develop a health problem that would prevent them from having lung cancer treatment.  If you are pregnant, do not drink alcohol. If you are  breastfeeding, be very cautious about drinking alcohol. If you are not pregnant and choose to drink alcohol, do not have more than 1 drink per day. One drink is considered to be 12 ounces (355 mL) of beer, 5 ounces (148 mL) of wine, or 1.5 ounces (44 mL) of liquor.  Avoid use of street drugs. Do not share needles with anyone. Ask for help if you need support or instructions about stopping the use of drugs.  High blood pressure causes heart disease and increases the risk  of stroke. Your blood pressure should be checked at least every 1 to 2 years. Ongoing high blood pressure should be treated with medicines if weight loss and exercise do not work.  If you are 55-79 years old, ask your health care provider if you should take aspirin to prevent strokes.  Diabetes screening is done by taking a blood sample to check your blood glucose level after you have not eaten for a certain period of time (fasting). If you are not overweight and you do not have risk factors for diabetes, you should be screened once every 3 years starting at age 45. If you are overweight or obese and you are 40-70 years of age, you should be screened for diabetes every year as part of your cardiovascular risk assessment.  Breast cancer screening is essential preventive care for women. You should practice "breast self-awareness." This means understanding the normal appearance and feel of your breasts and may include breast self-examination. Any changes detected, no matter how small, should be reported to a health care provider. Women in their 20s and 30s should have a clinical breast exam (CBE) by a health care provider as part of a regular health exam every 1 to 3 years. After age 40, women should have a CBE every year. Starting at age 40, women should consider having a mammogram (breast X-ray test) every year. Women who have a family history of breast cancer should talk to their health care provider about genetic screening. Women at a high risk of breast cancer should talk to their health care providers about having an MRI and a mammogram every year.  Breast cancer gene (BRCA)-related cancer risk assessment is recommended for women who have family members with BRCA-related cancers. BRCA-related cancers include breast, ovarian, tubal, and peritoneal cancers. Having family members with these cancers may be associated with an increased risk for harmful changes (mutations) in the breast cancer genes BRCA1 and  BRCA2. Results of the assessment will determine the need for genetic counseling and BRCA1 and BRCA2 testing.  Your health care provider may recommend that you be screened regularly for cancer of the pelvic organs (ovaries, uterus, and vagina). This screening involves a pelvic examination, including checking for microscopic changes to the surface of your cervix (Pap test). You may be encouraged to have this screening done every 3 years, beginning at age 21.  For women ages 30-65, health care providers may recommend pelvic exams and Pap testing every 3 years, or they may recommend the Pap and pelvic exam, combined with testing for human papilloma virus (HPV), every 5 years. Some types of HPV increase your risk of cervical cancer. Testing for HPV may also be done on women of any age with unclear Pap test results.  Other health care providers may not recommend any screening for nonpregnant women who are considered low risk for pelvic cancer and who do not have symptoms. Ask your health care provider if a screening pelvic exam is right for   you.  If you have had past treatment for cervical cancer or a condition that could lead to cancer, you need Pap tests and screening for cancer for at least 20 years after your treatment. If Pap tests have been discontinued, your risk factors (such as having a new sexual partner) need to be reassessed to determine if screening should resume. Some women have medical problems that increase the chance of getting cervical cancer. In these cases, your health care provider may recommend more frequent screening and Pap tests.  Colorectal cancer can be detected and often prevented. Most routine colorectal cancer screening begins at the age of 50 years and continues through age 75 years. However, your health care provider may recommend screening at an earlier age if you have risk factors for colon cancer. On a yearly basis, your health care provider may provide home test kits to check  for hidden blood in the stool. Use of a small camera at the end of a tube, to directly examine the colon (sigmoidoscopy or colonoscopy), can detect the earliest forms of colorectal cancer. Talk to your health care provider about this at age 50, when routine screening begins. Direct exam of the colon should be repeated every 5-10 years through age 75 years, unless early forms of precancerous polyps or small growths are found.  People who are at an increased risk for hepatitis B should be screened for this virus. You are considered at high risk for hepatitis B if:  You were born in a country where hepatitis B occurs often. Talk with your health care provider about which countries are considered high risk.  Your parents were born in a high-risk country and you have not received a shot to protect against hepatitis B (hepatitis B vaccine).  You have HIV or AIDS.  You use needles to inject street drugs.  You live with, or have sex with, someone who has hepatitis B.  You get hemodialysis treatment.  You take certain medicines for conditions like cancer, organ transplantation, and autoimmune conditions.  Hepatitis C blood testing is recommended for all people born from 1945 through 1965 and any individual with known risks for hepatitis C.  Practice safe sex. Use condoms and avoid high-risk sexual practices to reduce the spread of sexually transmitted infections (STIs). STIs include gonorrhea, chlamydia, syphilis, trichomonas, herpes, HPV, and human immunodeficiency virus (HIV). Herpes, HIV, and HPV are viral illnesses that have no cure. They can result in disability, cancer, and death.  You should be screened for sexually transmitted illnesses (STIs) including gonorrhea and chlamydia if:  You are sexually active and are younger than 24 years.  You are older than 24 years and your health care provider tells you that you are at risk for this type of infection.  Your sexual activity has changed  since you were last screened and you are at an increased risk for chlamydia or gonorrhea. Ask your health care provider if you are at risk.  If you are at risk of being infected with HIV, it is recommended that you take a prescription medicine daily to prevent HIV infection. This is called preexposure prophylaxis (PrEP). You are considered at risk if:  You are sexually active and do not regularly use condoms or know the HIV status of your partner(s).  You take drugs by injection.  You are sexually active with a partner who has HIV.  Talk with your health care provider about whether you are at high risk of being infected with HIV. If   you choose to begin PrEP, you should first be tested for HIV. You should then be tested every 3 months for as long as you are taking PrEP.  Osteoporosis is a disease in which the bones lose minerals and strength with aging. This can result in serious bone fractures or breaks. The risk of osteoporosis can be identified using a bone density scan. Women ages 67 years and over and women at risk for fractures or osteoporosis should discuss screening with their health care providers. Ask your health care provider whether you should take a calcium supplement or vitamin D to reduce the rate of osteoporosis.  Menopause can be associated with physical symptoms and risks. Hormone replacement therapy is available to decrease symptoms and risks. You should talk to your health care provider about whether hormone replacement therapy is right for you.  Use sunscreen. Apply sunscreen liberally and repeatedly throughout the day. You should seek shade when your shadow is shorter than you. Protect yourself by wearing long sleeves, pants, a wide-brimmed hat, and sunglasses year round, whenever you are outdoors.  Once a month, do a whole body skin exam, using a mirror to look at the skin on your back. Tell your health care provider of new moles, moles that have irregular borders, moles that  are larger than a pencil eraser, or moles that have changed in shape or color.  Stay current with required vaccines (immunizations).  Influenza vaccine. All adults should be immunized every year.  Tetanus, diphtheria, and acellular pertussis (Td, Tdap) vaccine. Pregnant women should receive 1 dose of Tdap vaccine during each pregnancy. The dose should be obtained regardless of the length of time since the last dose. Immunization is preferred during the 27th-36th week of gestation. An adult who has not previously received Tdap or who does not know her vaccine status should receive 1 dose of Tdap. This initial dose should be followed by tetanus and diphtheria toxoids (Td) booster doses every 10 years. Adults with an unknown or incomplete history of completing a 3-dose immunization series with Td-containing vaccines should begin or complete a primary immunization series including a Tdap dose. Adults should receive a Td booster every 10 years.  Varicella vaccine. An adult without evidence of immunity to varicella should receive 2 doses or a second dose if she has previously received 1 dose. Pregnant females who do not have evidence of immunity should receive the first dose after pregnancy. This first dose should be obtained before leaving the health care facility. The second dose should be obtained 4-8 weeks after the first dose.  Human papillomavirus (HPV) vaccine. Females aged 13-26 years who have not received the vaccine previously should obtain the 3-dose series. The vaccine is not recommended for use in pregnant females. However, pregnancy testing is not needed before receiving a dose. If a female is found to be pregnant after receiving a dose, no treatment is needed. In that case, the remaining doses should be delayed until after the pregnancy. Immunization is recommended for any person with an immunocompromised condition through the age of 61 years if she did not get any or all doses earlier. During the  3-dose series, the second dose should be obtained 4-8 weeks after the first dose. The third dose should be obtained 24 weeks after the first dose and 16 weeks after the second dose.  Zoster vaccine. One dose is recommended for adults aged 30 years or older unless certain conditions are present.  Measles, mumps, and rubella (MMR) vaccine. Adults born  before 1957 generally are considered immune to measles and mumps. Adults born in 1957 or later should have 1 or more doses of MMR vaccine unless there is a contraindication to the vaccine or there is laboratory evidence of immunity to each of the three diseases. A routine second dose of MMR vaccine should be obtained at least 28 days after the first dose for students attending postsecondary schools, health care workers, or international travelers. People who received inactivated measles vaccine or an unknown type of measles vaccine during 1963-1967 should receive 2 doses of MMR vaccine. People who received inactivated mumps vaccine or an unknown type of mumps vaccine before 1979 and are at high risk for mumps infection should consider immunization with 2 doses of MMR vaccine. For females of childbearing age, rubella immunity should be determined. If there is no evidence of immunity, females who are not pregnant should be vaccinated. If there is no evidence of immunity, females who are pregnant should delay immunization until after pregnancy. Unvaccinated health care workers born before 1957 who lack laboratory evidence of measles, mumps, or rubella immunity or laboratory confirmation of disease should consider measles and mumps immunization with 2 doses of MMR vaccine or rubella immunization with 1 dose of MMR vaccine.  Pneumococcal 13-valent conjugate (PCV13) vaccine. When indicated, a person who is uncertain of his immunization history and has no record of immunization should receive the PCV13 vaccine. All adults 65 years of age and older should receive this  vaccine. An adult aged 19 years or older who has certain medical conditions and has not been previously immunized should receive 1 dose of PCV13 vaccine. This PCV13 should be followed with a dose of pneumococcal polysaccharide (PPSV23) vaccine. Adults who are at high risk for pneumococcal disease should obtain the PPSV23 vaccine at least 8 weeks after the dose of PCV13 vaccine. Adults older than 40 years of age who have normal immune system function should obtain the PPSV23 vaccine dose at least 1 year after the dose of PCV13 vaccine.  Pneumococcal polysaccharide (PPSV23) vaccine. When PCV13 is also indicated, PCV13 should be obtained first. All adults aged 65 years and older should be immunized. An adult younger than age 65 years who has certain medical conditions should be immunized. Any person who resides in a nursing home or long-term care facility should be immunized. An adult smoker should be immunized. People with an immunocompromised condition and certain other conditions should receive both PCV13 and PPSV23 vaccines. People with human immunodeficiency virus (HIV) infection should be immunized as soon as possible after diagnosis. Immunization during chemotherapy or radiation therapy should be avoided. Routine use of PPSV23 vaccine is not recommended for American Indians, [place] Natives, or people younger than 65 years unless there are medical conditions that require PPSV23 vaccine. When indicated, people who have unknown immunization and have no record of immunization should receive PPSV23 vaccine. One-time revaccination 5 years after the first dose of PPSV23 is recommended for people aged 19-64 years who have chronic kidney failure, nephrotic syndrome, asplenia, or immunocompromised conditions. People who received 1-2 doses of PPSV23 before age 65 years should receive another dose of PPSV23 vaccine at age 65 years or later if at least 5 years have passed since the previous dose. Doses of PPSV23 are not  needed for people immunized with PPSV23 at or after age 65 years.  Meningococcal vaccine. Adults with asplenia or persistent complement component deficiencies should receive 2 doses of quadrivalent meningococcal conjugate (MenACWY-D) vaccine. The doses should be obtained   at least 2 months apart. Microbiologists working with certain meningococcal bacteria, [place] recruits, people at risk during an outbreak, and people who travel to or live in countries with a high rate of meningitis should be immunized. A first-year college student up through age 34 years who is living in a residence hall should receive a dose if she did not receive a dose on or after her 16th birthday. Adults who have certain high-risk conditions should receive one or more doses of vaccine.  Hepatitis A vaccine. Adults who wish to be protected from this disease, have certain high-risk conditions, work with hepatitis A-infected animals, work in hepatitis A research labs, or travel to or work in countries with a high rate of hepatitis A should be immunized. Adults who were previously unvaccinated and who anticipate close contact with an international adoptee during the first 60 days after arrival in the [place] from a country with a high rate of hepatitis A should be immunized.  Hepatitis B vaccine. Adults who wish to be protected from this disease, have certain high-risk conditions, may be exposed to blood or other infectious body fluids, are household contacts or sex partners of hepatitis B positive people, are clients or workers in certain care facilities, or travel to or work in countries with a high rate of hepatitis B should be immunized.  Haemophilus influenzae type b (Hib) vaccine. A previously unvaccinated person with asplenia or sickle cell disease or having a scheduled splenectomy should receive 1 dose of Hib vaccine. Regardless of previous immunization, a recipient of a hematopoietic stem cell transplant should receive a  3-dose series 6-12 months after her successful transplant. Hib vaccine is not recommended for adults with HIV infection. Preventive Services / Frequency Ages 35 to 4 years  Blood pressure check.** / Every 3-5 years.  Lipid and cholesterol check.** / Every 5 years beginning at age 60.  Clinical breast exam.** / Every 3 years for women in their 71s and 10s.  BRCA-related cancer risk assessment.** / For women who have family members with a BRCA-related cancer (breast, ovarian, tubal, or peritoneal cancers).  Pap test.** / Every 2 years from ages 76 through 26. Every 3 years starting at age 61 through age 76 or 93 with a history of 3 consecutive normal Pap tests.  HPV screening.** / Every 3 years from ages 37 through ages 60 to 51 with a history of 3 consecutive normal Pap tests.  Hepatitis C blood test.** / For any individual with known risks for hepatitis C.  Skin self-exam. / Monthly.  Influenza vaccine. / Every year.  Tetanus, diphtheria, and acellular pertussis (Tdap, Td) vaccine.** / Consult your health care provider. Pregnant women should receive 1 dose of Tdap vaccine during each pregnancy. 1 dose of Td every 10 years.  Varicella vaccine.** / Consult your health care provider. Pregnant females who do not have evidence of immunity should receive the first dose after pregnancy.  HPV vaccine. / 3 doses over 6 months, if 93 and younger. The vaccine is not recommended for use in pregnant females. However, pregnancy testing is not needed before receiving a dose.  Measles, mumps, rubella (MMR) vaccine.** / You need at least 1 dose of MMR if you were born in 1957 or later. You may also need a 2nd dose. For females of childbearing age, rubella immunity should be determined. If there is no evidence of immunity, females who are not pregnant should be vaccinated. If there is no evidence of immunity, females who are  pregnant should delay immunization until after pregnancy.  Pneumococcal  13-valent conjugate (PCV13) vaccine.** / Consult your health care provider.  Pneumococcal polysaccharide (PPSV23) vaccine.** / 1 to 2 doses if you smoke cigarettes or if you have certain conditions.  Meningococcal vaccine.** / 1 dose if you are age 68 to 8 years and a Market researcher living in a residence hall, or have one of several medical conditions, you need to get vaccinated against meningococcal disease. You may also need additional booster doses.  Hepatitis A vaccine.** / Consult your health care provider.  Hepatitis B vaccine.** / Consult your health care provider.  Haemophilus influenzae type b (Hib) vaccine.** / Consult your health care provider. Ages 7 to 53 years  Blood pressure check.** / Every year.  Lipid and cholesterol check.** / Every 5 years beginning at age 25 years.  Lung cancer screening. / Every year if you are aged 11-80 years and have a 30-pack-year history of smoking and currently smoke or have quit within the past 15 years. Yearly screening is stopped once you have quit smoking for at least 15 years or develop a health problem that would prevent you from having lung cancer treatment.  Clinical breast exam.** / Every year after age 48 years.  BRCA-related cancer risk assessment.** / For women who have family members with a BRCA-related cancer (breast, ovarian, tubal, or peritoneal cancers).  Mammogram.** / Every year beginning at age 41 years and continuing for as long as you are in good health. Consult with your health care provider.  Pap test.** / Every 3 years starting at age 65 years through age 37 or 70 years with a history of 3 consecutive normal Pap tests.  HPV screening.** / Every 3 years from ages 72 years through ages 60 to 40 years with a history of 3 consecutive normal Pap tests.  Fecal occult blood test (FOBT) of stool. / Every year beginning at age 21 years and continuing until age 5 years. You may not need to do this test if you get  a colonoscopy every 10 years.  Flexible sigmoidoscopy or colonoscopy.** / Every 5 years for a flexible sigmoidoscopy or every 10 years for a colonoscopy beginning at age 35 years and continuing until age 48 years.  Hepatitis C blood test.** / For all people born from [birth year] through 1965 and any individual with known risks for hepatitis C.  Skin self-exam. / Monthly.  Influenza vaccine. / Every year.  Tetanus, diphtheria, and acellular pertussis (Tdap/Td) vaccine.** / Consult your health care provider. Pregnant women should receive 1 dose of Tdap vaccine during each pregnancy. 1 dose of Td every 10 years.  Varicella vaccine.** / Consult your health care provider. Pregnant females who do not have evidence of immunity should receive the first dose after pregnancy.  Zoster vaccine.** / 1 dose for adults aged 30 years or older.  Measles, mumps, rubella (MMR) vaccine.** / You need at least 1 dose of MMR if you were born in 1957 or later. You may also need a second dose. For females of childbearing age, rubella immunity should be determined. If there is no evidence of immunity, females who are not pregnant should be vaccinated. If there is no evidence of immunity, females who are pregnant should delay immunization until after pregnancy.  Pneumococcal 13-valent conjugate (PCV13) vaccine.** / Consult your health care provider.  Pneumococcal polysaccharide (PPSV23) vaccine.** / 1 to 2 doses if you smoke cigarettes or if you have certain conditions.  Meningococcal vaccine.** /  Consult your health care provider.  Hepatitis A vaccine.** / Consult your health care provider.  Hepatitis B vaccine.** / Consult your health care provider.  Haemophilus influenzae type b (Hib) vaccine.** / Consult your health care provider. Ages 64 years and over  Blood pressure check.** / Every year.  Lipid and cholesterol check.** / Every 5 years beginning at age 23 years.  Lung cancer screening. / Every year if you  are aged 16-80 years and have a 30-pack-year history of smoking and currently smoke or have quit within the past 15 years. Yearly screening is stopped once you have quit smoking for at least 15 years or develop a health problem that would prevent you from having lung cancer treatment.  Clinical breast exam.** / Every year after age 74 years.  BRCA-related cancer risk assessment.** / For women who have family members with a BRCA-related cancer (breast, ovarian, tubal, or peritoneal cancers).  Mammogram.** / Every year beginning at age 44 years and continuing for as long as you are in good health. Consult with your health care provider.  Pap test.** / Every 3 years starting at age 58 years through age 22 or 39 years with 3 consecutive normal Pap tests. Testing can be stopped between 65 and 70 years with 3 consecutive normal Pap tests and no abnormal Pap or HPV tests in the past 10 years.  HPV screening.** / Every 3 years from ages 64 years through ages 70 or 61 years with a history of 3 consecutive normal Pap tests. Testing can be stopped between 65 and 70 years with 3 consecutive normal Pap tests and no abnormal Pap or HPV tests in the past 10 years.  Fecal occult blood test (FOBT) of stool. / Every year beginning at age 40 years and continuing until age 27 years. You may not need to do this test if you get a colonoscopy every 10 years.  Flexible sigmoidoscopy or colonoscopy.** / Every 5 years for a flexible sigmoidoscopy or every 10 years for a colonoscopy beginning at age 7 years and continuing until age 32 years.  Hepatitis C blood test.** / For all people born from [birth year] through 1965 and any individual with known risks for hepatitis C.  Osteoporosis screening.** / A one-time screening for women ages 30 years and over and women at risk for fractures or osteoporosis.  Skin self-exam. / Monthly.  Influenza vaccine. / Every year.  Tetanus, diphtheria, and acellular pertussis (Tdap/Td)  vaccine.** / 1 dose of Td every 10 years.  Varicella vaccine.** / Consult your health care provider.  Zoster vaccine.** / 1 dose for adults aged 35 years or older.  Pneumococcal 13-valent conjugate (PCV13) vaccine.** / Consult your health care provider.  Pneumococcal polysaccharide (PPSV23) vaccine.** / 1 dose for all adults aged 46 years and older.  Meningococcal vaccine.** / Consult your health care provider.  Hepatitis A vaccine.** / Consult your health care provider.  Hepatitis B vaccine.** / Consult your health care provider.  Haemophilus influenzae type b (Hib) vaccine.** / Consult your health care provider. ** Family history and personal history of risk and conditions may change your health care provider's recommendations.   This information is not intended to replace advice given to you by your health care provider. Make sure you discuss any questions you have with your health care provider.   Document Released: 10/21/2001 Document Revised: 09/15/2014 Document Reviewed: 01/20/2011 Elsevier Interactive Patient Education [employer].

## 2015-10-22 NOTE — Assessment & Plan Note (Signed)
S/p bad viral illness and adverse reaction to prednisone, doing much better. Not needing any inhalers at this time. Was seen at [facility], started on Singulair and kept on Pepcid. Zyrtec daily

## 2015-10-22 NOTE — Assessment & Plan Note (Addendum)
With strong family history of diabetes, will check a hgba1c which is mildly elevated. hgba1c acceptable, minimize simple carbs. Increase exercise as tolerated.

## 2015-10-22 NOTE — Progress Notes (Signed)
Patient ID: Veronica Stokes, female   DOB: 11/12/1975, 40 y.o.   MRN: [id number]   Subjective:    Patient ID: Veronica Stokes, female    DOB: Jun 18, 1976, 40 y.o.   MRN: [id number]  Chief Complaint  Patient presents with  . Annual Exam    HPI Patient is in today for  Annual exam. No recent illness or acute concern. Denies polyuria or polydipsia. Tries to maintain a heart healthy diet and regular exercise. Denies CP/palp/SOB/HA/congestion/fevers/GI or GU c/o. Taking meds as prescribed  Past Medical History  Diagnosis Date  . Chicken pox as a child  . Polycystic ovarian syndrome 8-12  . Asthma     cough variant  . Allergic state 12/18/2008    Qualifier: Diagnosis of  By: Beverely Low MD, Natalia Leatherwood    . Preventative health care 10/28/2015  . Hyperlipidemia, mixed 10/28/2015  . Hyperglycemia 10/28/2015    Past Surgical History  Procedure Laterality Date  . Knee surgery  12-94, 10-07    X 2 left  . Wisdom tooth extraction  2001    Family History  Problem Relation Age of Onset  . Diabetes Mother     type 2  . Hypertension Mother   . Diabetes Father     type 2- off of everything since gastric bypass  . Mental illness Sister     bipolar, ADHD  . Mental illness Maternal Grandmother     manic and bipolar  . Stroke Maternal Grandmother   . Alcohol abuse Maternal Grandmother   . Stokes loss Maternal Grandmother   . Heart disease Maternal Grandfather   . Heart attack Maternal Grandfather   . Diabetes Paternal Grandmother     type 2  . Stroke Paternal Grandmother   . Thyroid disease Paternal Grandmother   . Stroke Paternal Grandfather   . Heart attack Paternal Grandfather   . Cancer Maternal Aunt 50    breast  . Cancer Maternal Aunt 7    breast     Social History   Social History  . Marital Status: Married    Spouse Name: N/A  . Number of Children: N/A  . Years of Education: N/A   Occupational History  . Not on file.   Social History Main Topics  . Smoking  status: Never Smoker   . Smokeless tobacco: Never Used  . Alcohol Use: No  . Drug Use: No  . 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 Not on file   Social History Narrative    Outpatient Prescriptions Prior to Visit  Medication Sig Dispense Refill  . albuterol (PROVENTIL HFA;VENTOLIN HFA) 108 (90 BASE) MCG/ACT inhaler Inhale 2 puffs into the lungs every 6 (six) hours as needed for wheezing. 18 g 1  . albuterol (PROVENTIL) (2.5 MG/3ML) 0.083% nebulizer solution Take 3 mLs (2.5 mg total) by nebulization every 4 (four) hours as needed for wheezing or shortness of breath. 75 mL 1  . calcium carbonate (OS-CAL) 600 MG TABS Take 600 mg by mouth daily.    . famotidine (PEPCID) 20 MG tablet 1 tab po bid 30 tablet 1  . Pediatric Multiple Vitamins (FLINTSTONES MULTIVITAMIN PO) Take 2 tablets by mouth.    [name] guaiFENesin-codeine 100-10 MG/5ML syrup Take 5 mLs by mouth every 4 (four) hours as needed for cough. 120 mL 0  . nystatin (MYCOSTATIN) 100000 UNIT/ML suspension  5 ml po swish, gargle, and swallow qid 240 mL 0   No facility-administered medications prior to visit.    Allergies  Allergen Reactions  . Corticosteroids Anaphylaxis  . Eggs Or Egg-Derived Products   . Latex   . Metformin And Related Nausea And Vomiting  . Penicillins     REACTION: SWELLING  . Sulfa Antibiotics Itching    Review of Systems  Constitutional: Negative for fever, chills and malaise/fatigue.  HENT: Negative for congestion and Stokes loss.   Eyes: Negative for discharge.  Respiratory: Negative for cough, sputum production and shortness of breath.   Cardiovascular: Negative for chest pain, palpitations and leg swelling.  Gastrointestinal: Negative for heartburn, nausea, vomiting, abdominal pain, diarrhea, constipation and blood in stool.  Genitourinary: Negative for dysuria, urgency, frequency and hematuria.    Musculoskeletal: Negative for myalgias, back pain and falls.  Skin: Negative for rash.  Neurological: Negative for dizziness, sensory change, loss of consciousness, weakness and headaches.  Endo/Heme/Allergies: Negative for environmental allergies. Does not bruise/bleed easily.  Psychiatric/Behavioral: Negative for depression and suicidal ideas. The patient is not nervous/anxious and does not have insomnia.        Objective:    Physical Exam  Constitutional: She is oriented to person, place, and time. She appears well-developed and well-nourished. No distress.  HENT:  Head: Normocephalic and atraumatic.  Eyes: Conjunctivae are normal.  Neck: Neck supple. No thyromegaly present.  Cardiovascular: Normal rate, regular rhythm and normal heart sounds.   No murmur heard. Pulmonary/Chest: Effort normal and breath sounds normal. No respiratory distress.  Abdominal: Soft. Bowel sounds are normal. She exhibits no distension and no mass. There is no tenderness.  Musculoskeletal: She exhibits no edema.  Lymphadenopathy:    She has no cervical adenopathy.  Neurological: She is alert and oriented to person, place, and time.  Skin: Skin is warm and dry.  Psychiatric: She has a normal mood and affect. Her behavior is normal.    BP 132/78 mmHg  Pulse 93  Temp(Src) 98.3 F (36.8 C) (Oral)  Ht 4\' 11"  (1.499 m)  Wt 207 lb 8 oz (94.121 kg)  BMI 41.89 kg/m2  SpO2 98% Wt Readings from Last 3 Encounters:  10/22/15 207 lb 8 oz (94.121 kg)  09/13/15 200 lb 12 oz (91.06 kg)  09/12/15 201 lb 4 oz (91.286 kg)     Lab Results  Component Value Date   WBC 9.9 10/22/2015   HGB 12.7 10/22/2015   HCT 37.2 10/22/2015   PLT 350.0 10/22/2015   GLUCOSE 113* 10/22/2015   CHOL 250* 10/22/2015   TRIG 258.0* 10/22/2015   HDL 58.10 10/22/2015   LDLDIRECT 161.0 10/22/2015   ALT 21 10/22/2015   AST 18 10/22/2015   NA 138 10/22/2015   K 3.9 10/22/2015   CL 102 10/22/2015   CREATININE 0.76 10/22/2015    BUN 16 10/22/2015   CO2 28 10/22/2015   TSH 2.51 10/22/2015   HGBA1C 6.1 10/22/2015    Lab Results  Component Value Date   TSH 2.51 10/22/2015   Lab Results  Component Value Date   WBC 9.9 10/22/2015   HGB 12.7 10/22/2015   HCT 37.2 10/22/2015   MCV 80.5 10/22/2015   PLT 350.0 10/22/2015   Lab Results  Component Value Date   NA 138 10/22/2015   K 3.9 10/22/2015   CO2 28 10/22/2015   GLUCOSE 113* 10/22/2015   BUN 16 10/22/2015   CREATININE 0.76 10/22/2015   BILITOT 0.3 10/22/2015   ALKPHOS  58 10/22/2015   AST 18 10/22/2015   ALT 21 10/22/2015   PROT 7.4 10/22/2015   ALBUMIN 4.5 10/22/2015   CALCIUM 9.3 10/22/2015   GFR 89.74 10/22/2015      Assessment & Plan:   Problem List Items Addressed This Visit    Asthma    Cough variant, use asthma prn and continue singulair      Relevant Medications   montelukast (SINGULAIR) 10 MG tablet   Cough variant asthma    S/p bad viral illness and adverse reaction to prednisone, doing much better. Not needing any inhalers at this time. Was seen at [facility], started on Singulair and kept on Pepcid. Zyrtec daily      Relevant Medications   montelukast (SINGULAIR) 10 MG tablet   ELEVATED BLOOD PRESSURE WITHOUT DIAGNOSIS OF HYPERTENSION    Encouraged heart healthy diet such as the DASH diet and exercise as tolerated.       Hyperglycemia    hgba1c 6.1 start Acarbose      Hyperlipidemia, mixed    Encouraged heart healthy diet, increase exercise, avoid trans fats, consider a krill oil cap daily      Leg cramp    Encouraged increased hydration and consider Hyland's leg cramp med to try prn      Relevant Medications   acarbose (PRECOSE) 25 MG tablet   Other Relevant Orders   TSH (Completed)   T4, free (Completed)   Hemoglobin A1c (Completed)   Lipid panel (Completed)   CBC (Completed)   Comprehensive metabolic panel (Completed)   Polycystic ovarian syndrome    With strong family history of diabetes, will check  a hgba1c which is mildly elevated. hgba1c acceptable, minimize simple carbs. Increase exercise as tolerated.      Preventative health care - Primary    Patient encouraged to maintain heart healthy diet, regular exercise, adequate sleep. Consider daily probiotics. Take medications as prescribed. Labs reviwed. Given and reviewed copy of ACP documents from [facility] Secretary of State and encouraged to complete and return      Relevant Medications   acarbose (PRECOSE) 25 MG tablet   Other Relevant Orders   TSH (Completed)   T4, free (Completed)   Hemoglobin A1c (Completed)   Lipid panel (Completed)   CBC (Completed)   Comprehensive metabolic panel (Completed)    Other Visit Diagnoses    Anemia, unspecified anemia type        Relevant Medications    acarbose (PRECOSE) 25 MG tablet    Other Relevant Orders    TSH (Completed)    T4, free (Completed)    Hemoglobin A1c (Completed)    Lipid panel (Completed)    CBC (Completed)    Comprehensive metabolic panel (Completed)    PCOS (polycystic ovarian syndrome)        Relevant Medications    acarbose (PRECOSE) 25 MG tablet    Other Relevant Orders    TSH (Completed)    T4, free (Completed)    Hemoglobin A1c (Completed)    Lipid panel (Completed)    CBC (Completed)    Comprehensive metabolic panel (Completed)       I have discontinued Ms. Ventress's nystatin and guaiFENesin-codeine. I am also having her start on acarbose. Additionally, I am having her maintain her calcium carbonate, Pediatric Multiple Vitamins (FLINTSTONES MULTIVITAMIN PO), albuterol, albuterol, famotidine, montelukast, and cetirizine.  Meds ordered this encounter  Medications  . montelukast (SINGULAIR) 10 MG tablet    Sig: Take 1 tablet by mouth daily.  Refill:  4  . cetirizine (ZYRTEC ALLERGY) 10 MG tablet    Sig: Take 10 mg by mouth daily.  [name] acarbose (PRECOSE) 25 MG tablet    Sig: Take 1 tablet (25 mg total) by mouth 3 (three) times daily with meals.     Dispense:  90 tablet    Refill:  5     Danise Edge, MD

## 2015-10-23 LAB — COMPREHENSIVE METABOLIC PANEL
ALBUMIN: 4.5 g/dL (ref 3.5–5.2)
ALT: 21 U/L (ref 0–35)
AST: 18 U/L (ref 0–37)
Alkaline Phosphatase: 58 U/L (ref 39–117)
BILIRUBIN TOTAL: 0.3 mg/dL (ref 0.2–1.2)
BUN: 16 mg/dL (ref 6–23)
CALCIUM: 9.3 mg/dL (ref 8.4–10.5)
CO2: 28 mEq/L (ref 19–32)
CREATININE: 0.76 mg/dL (ref 0.40–1.20)
Chloride: 102 mEq/L (ref 96–112)
GFR: 89.74 mL/min (ref >60.00)
Glucose, Bld: 113 mg/dL — ABNORMAL HIGH (ref 70–99)
Potassium: 3.9 mEq/L (ref 3.5–5.1)
Sodium: 138 mEq/L (ref 135–145)
Total Protein: 7.4 g/dL (ref 6.0–8.3)

## 2015-10-23 LAB — CBC
HCT: 37.2 % (ref 36.0–46.0)
Hemoglobin: 12.7 g/dL (ref 12.0–15.0)
MCHC: 34 g/dL (ref 30.0–36.0)
MCV: 80.5 fl (ref 78.0–100.0)
PLATELETS: 350 10*3/uL (ref 150.0–400.0)
RBC: 4.62 Mil/uL (ref 3.87–5.11)
RDW: 14.5 % (ref 11.5–15.5)
WBC: 9.9 10*3/uL (ref 4.0–10.5)

## 2015-10-23 LAB — LIPID PANEL
CHOL/HDL RATIO: 4
CHOLESTEROL: 250 mg/dL — AB (ref 0–200)
HDL: 58.1 mg/dL (ref >39.00)
NONHDL: 192.36
Triglycerides: 258 mg/dL — ABNORMAL HIGH (ref 0.0–149.0)
VLDL: 51.6 mg/dL — ABNORMAL HIGH (ref 0.0–40.0)

## 2015-10-23 LAB — LDL CHOLESTEROL, DIRECT: LDL DIRECT: 161 mg/dL

## 2015-10-23 LAB — TSH: TSH: 2.51 u[IU]/mL (ref 0.35–4.50)

## 2015-10-23 LAB — HEMOGLOBIN A1C: Hgb A1c MFr Bld: 6.1 % (ref 4.6–6.5)

## 2015-10-23 LAB — T4, FREE: Free T4: 0.71 ng/dL (ref 0.60–1.60)

## 2015-10-28 ENCOUNTER — Encounter: Payer: Self-pay | Admitting: Family Medicine

## 2015-10-28 DIAGNOSIS — R252 Cramp and spasm: Secondary | ICD-10-CM | POA: Insufficient documentation

## 2015-10-28 DIAGNOSIS — R739 Hyperglycemia, unspecified: Secondary | ICD-10-CM

## 2015-10-28 DIAGNOSIS — E119 Type 2 diabetes mellitus without complications: Secondary | ICD-10-CM | POA: Insufficient documentation

## 2015-10-28 DIAGNOSIS — E782 Mixed hyperlipidemia: Secondary | ICD-10-CM

## 2015-10-28 DIAGNOSIS — Z0000 Encounter for general adult medical examination without abnormal findings: Secondary | ICD-10-CM

## 2015-10-28 HISTORY — DX: Mixed hyperlipidemia: E78.2

## 2015-10-28 HISTORY — DX: Hyperglycemia, unspecified: R73.9

## 2015-10-28 HISTORY — DX: Encounter for general adult medical examination without abnormal findings: Z00.00

## 2015-10-28 NOTE — Assessment & Plan Note (Signed)
Cough variant, use asthma prn and continue singulair

## 2015-10-28 NOTE — Assessment & Plan Note (Signed)
Encouraged heart healthy diet such as the DASH diet and exercise as tolerated.  

## 2015-10-28 NOTE — Assessment & Plan Note (Signed)
Encouraged heart healthy diet, increase exercise, avoid trans fats, consider a krill oil cap daily 

## 2015-10-28 NOTE — Assessment & Plan Note (Signed)
Patient encouraged to maintain heart healthy diet, regular exercise, adequate sleep. Consider daily probiotics. Take medications as prescribed. Labs reviwed. Given and reviewed copy of ACP documents from [employer] and encouraged to complete and return

## 2015-10-28 NOTE — Assessment & Plan Note (Signed)
Encouraged increased hydration and consider Hyland's leg cramp med to try prn

## 2015-10-28 NOTE — Assessment & Plan Note (Signed)
hgba1c 6.1 start Acarbose

## 2016-04-17 ENCOUNTER — Other Ambulatory Visit: Payer: Self-pay | Admitting: Surgical Oncology

## 2016-04-19 ENCOUNTER — Other Ambulatory Visit: Payer: Self-pay | Admitting: Surgical Oncology

## 2016-04-21 ENCOUNTER — Ambulatory Visit: Payer: BC Managed Care – PPO | Admitting: Family Medicine

## 2016-04-21 DIAGNOSIS — Z0289 Encounter for other administrative examinations: Secondary | ICD-10-CM

## 2016-04-24 ENCOUNTER — Encounter: Payer: Self-pay | Admitting: Family Medicine

## 2016-05-01 ENCOUNTER — Inpatient Hospital Stay: Admission: RE | Admit: 2016-05-01 | Payer: BC Managed Care – PPO | Source: Ambulatory Visit

## 2016-05-01 ENCOUNTER — Ambulatory Visit
Admission: RE | Admit: 2016-05-01 | Discharge: 2016-05-01 | Disposition: A | Discharge status: Home / Self Care | Payer: BC Managed Care – PPO | Source: Ambulatory Visit | Attending: Surgical Oncology | Admitting: Surgical Oncology

## 2016-05-01 ENCOUNTER — Other Ambulatory Visit: Payer: Self-pay | Admitting: Surgical Oncology

## 2016-06-09 ENCOUNTER — Ambulatory Visit: Payer: BC Managed Care – PPO | Admitting: Nurse Practitioner

## 2016-07-15 ENCOUNTER — Ambulatory Visit: Payer: Self-pay | Admitting: Family Medicine

## 2016-08-19 ENCOUNTER — Ambulatory Visit (INDEPENDENT_AMBULATORY_CARE_PROVIDER_SITE_OTHER): Payer: BC Managed Care – PPO | Admitting: Family Medicine

## 2016-08-19 DIAGNOSIS — G4700 Insomnia, unspecified: Secondary | ICD-10-CM

## 2016-08-19 DIAGNOSIS — R739 Hyperglycemia, unspecified: Secondary | ICD-10-CM | POA: Diagnosis not present

## 2016-08-19 DIAGNOSIS — E6609 Other obesity due to excess calories: Secondary | ICD-10-CM | POA: Diagnosis not present

## 2016-08-19 DIAGNOSIS — E782 Mixed hyperlipidemia: Secondary | ICD-10-CM | POA: Diagnosis not present

## 2016-08-19 NOTE — Patient Instructions (Signed)

## 2016-08-19 NOTE — Progress Notes (Signed)
Pre visit review using our clinic review tool, if applicable. No additional management support is needed unless otherwise documented below in the visit note. 

## 2016-08-25 ENCOUNTER — Telehealth: Payer: Self-pay | Admitting: Nurse Practitioner

## 2016-08-25 NOTE — Telephone Encounter (Signed)
LMTCB NP/CX/RS/RD

## 2016-08-28 ENCOUNTER — Ambulatory Visit: Payer: BC Managed Care – PPO | Admitting: Obstetrics and Gynecology

## 2016-08-28 ENCOUNTER — Ambulatory Visit: Payer: BC Managed Care – PPO | Admitting: Nurse Practitioner

## 2016-09-09 ENCOUNTER — Encounter: Payer: Self-pay | Admitting: Family Medicine

## 2016-09-09 DIAGNOSIS — E669 Obesity, unspecified: Secondary | ICD-10-CM

## 2016-09-09 DIAGNOSIS — G4700 Insomnia, unspecified: Secondary | ICD-10-CM | POA: Insufficient documentation

## 2016-09-09 HISTORY — DX: Insomnia, unspecified: G47.00

## 2016-09-09 HISTORY — DX: Obesity, unspecified: E66.9

## 2016-09-09 NOTE — Progress Notes (Signed)
Patient ID: Veronica Stokes, female   DOB: 1975/09/23, 41 y.o.   MRN: [id number]   Subjective:    Patient ID: Veronica Stokes, female    DOB: 10-16-1975, 41 y.o.   MRN: [id number]  No chief complaint on file.   HPI Patient is in today for follow up. She underwent gastric surgery on 07/15/2016. She is recovering well. Her endocrinologist at [facility] recommended the surgery for management of her PCOS. No recent acute illness or injury. She has been moving her bowels well and denies any complications. She has been recently struggling with insomnia but it is improved the past day or so. Denies CP/palp/SOB/HA/congestion/fevers/GI or GU c/o. Taking meds as prescribed  Past Medical History:  Diagnosis Date  . Allergic state 12/18/2008   Qualifier: Diagnosis of  By: Beverely Low MD, Natalia Leatherwood    . Asthma    cough variant  . Chicken pox as a child  . Hyperglycemia 10/28/2015  . Hyperlipidemia, mixed 10/28/2015  . Insomnia 09/09/2016  . Obesity 09/09/2016  . Polycystic ovarian syndrome 8-12  . Preventative health care 10/28/2015    Past Surgical History:  Procedure Laterality Date  . KNEE SURGERY  12-94, 10-07   X 2 left  . WISDOM TOOTH EXTRACTION  2001    Family History  Problem Relation Age of Onset  . Diabetes Mother     type 2  . Hypertension Mother   . Diabetes Father     type 2- off of everything since gastric bypass  . Mental illness Sister     bipolar, ADHD  . Mental illness Maternal Grandmother     manic and bipolar  . Stroke Maternal Grandmother   . Alcohol abuse Maternal Grandmother   . Stokes loss Maternal Grandmother   . Heart disease Maternal Grandfather   . Heart attack Maternal Grandfather   . Diabetes Paternal Grandmother     type 2  . Stroke Paternal Grandmother   . Thyroid disease Paternal Grandmother   . Stroke Paternal Grandfather   . Heart attack Paternal Grandfather   . Cancer Maternal Aunt 50    breast  . Cancer Maternal Aunt 32    breast     Social  History   Social History  . Marital status: Married    Spouse name: N/A  . Number of children: N/A  . Years of education: N/A   Occupational History  . Not on file.   Social History Main Topics  . Smoking status: Never Smoker  . Smokeless tobacco: Never Used  . Alcohol use No  . Drug use: No  . 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 Not on file   Social History Narrative  . No narrative on file    Outpatient Medications Prior to Visit  Medication Sig Dispense Refill  . acarbose (PRECOSE) 25 MG tablet Take 1 tablet (25 mg total) by mouth 3 (three) times daily with meals. 90 tablet 5  . albuterol (PROVENTIL HFA;VENTOLIN HFA) 108 (90 BASE) MCG/ACT inhaler Inhale 2 puffs into the lungs every 6 (six) hours as needed for wheezing. 18 g 1  . albuterol (PROVENTIL) (2.5 MG/3ML) 0.083% nebulizer solution Take 3 mLs (2.5 mg total) by nebulization every 4 (four) hours as needed for wheezing or shortness of breath. 75 mL 1  . cetirizine (ZYRTEC ALLERGY) 10 MG tablet Take 10 mg  by mouth daily.    . montelukast (SINGULAIR) 10 MG tablet Take 1 tablet by mouth daily.  4  . calcium carbonate (OS-CAL) 600 MG TABS Take 600 mg by mouth daily.    . Pediatric Multiple Vitamins (FLINTSTONES MULTIVITAMIN PO) Take 2 tablets by mouth.    . famotidine (PEPCID) 20 MG tablet 1 tab po bid (Patient not taking: Reported on 08/19/2016) 30 tablet 1   No facility-administered medications prior to visit.     Allergies  Allergen Reactions  . Corticosteroids Anaphylaxis  . Eggs Or Egg-Derived Products   . Latex   . Metformin And Related Nausea And Vomiting  . Penicillins     REACTION: SWELLING  . Sulfa Antibiotics Itching    Review of Systems  Constitutional: Negative for fever and malaise/fatigue.  HENT: Negative for congestion.   Eyes: Negative for blurred vision.  Respiratory: Negative for  shortness of breath.   Cardiovascular: Negative for chest pain, palpitations and leg swelling.  Gastrointestinal: Negative for abdominal pain, blood in stool and nausea.  Genitourinary: Negative for dysuria and frequency.  Musculoskeletal: Negative for falls.  Skin: Negative for rash.  Neurological: Negative for dizziness, loss of consciousness and headaches.  Endo/Heme/Allergies: Negative for environmental allergies.  Psychiatric/Behavioral: Negative for depression. The patient has insomnia. The patient is not nervous/anxious.        Objective:    Physical Exam  Constitutional: She is oriented to person, place, and time. She appears well-developed and well-nourished. No distress.  HENT:  Head: Normocephalic and atraumatic.  Nose: Nose normal.  Eyes: Right eye exhibits no discharge. Left eye exhibits no discharge.  Neck: Normal range of motion. Neck supple.  Cardiovascular: Normal rate and regular rhythm.   No murmur heard. Pulmonary/Chest: Effort normal and breath sounds normal.  Abdominal: Soft. Bowel sounds are normal. There is no tenderness.  Musculoskeletal: She exhibits no edema.  Neurological: She is alert and oriented to person, place, and time.  Skin: Skin is warm and dry.  Psychiatric: She has a normal mood and affect.  Nursing note and vitals reviewed.   BP 120/80 (BP Location: Left Arm, Patient Position: Sitting, Cuff Size: Large)   Pulse 65   Temp 98 F (36.7 C) (Oral)   Wt 176 lb 6.4 oz (80 kg)   LMP 08/15/2016 (Exact Date)   SpO2 99% Comment: RA  BMI 35.63 kg/m  Wt Readings from Last 3 Encounters:  08/19/16 176 lb 6.4 oz (80 kg)  10/22/15 207 lb 8 oz (94.1 kg)  09/13/15 200 lb 12 oz (91.1 kg)     Lab Results  Component Value Date   WBC 9.9 10/22/2015   HGB 12.7 10/22/2015   HCT 37.2 10/22/2015   PLT 350.0 10/22/2015   GLUCOSE 113 (H) 10/22/2015   CHOL 250 (H) 10/22/2015   TRIG 258.0 (H) 10/22/2015   HDL 58.10 10/22/2015   LDLDIRECT 161.0  10/22/2015   ALT 21 10/22/2015   AST 18 10/22/2015   NA 138 10/22/2015   K 3.9 10/22/2015   CL 102 10/22/2015   CREATININE 0.76 10/22/2015   BUN 16 10/22/2015   CO2 28 10/22/2015   TSH 2.51 10/22/2015   HGBA1C 6.1 10/22/2015    Lab Results  Component Value Date   TSH 2.51 10/22/2015   Lab Results  Component Value Date   WBC 9.9 10/22/2015   HGB 12.7 10/22/2015   HCT 37.2 10/22/2015   MCV 80.5 10/22/2015   PLT 350.0 10/22/2015   Lab Results  Component Value Date   NA 138 10/22/2015   K 3.9 10/22/2015   CO2 28 10/22/2015   GLUCOSE 113 (H) 10/22/2015   BUN 16 10/22/2015   CREATININE 0.76 10/22/2015   BILITOT 0.3 10/22/2015   ALKPHOS 58 10/22/2015   AST 18 10/22/2015   ALT 21 10/22/2015   PROT 7.4 10/22/2015   ALBUMIN 4.5 10/22/2015   CALCIUM 9.3 10/22/2015   GFR 89.74 10/22/2015   Lab Results  Component Value Date   CHOL 250 (H) 10/22/2015   Lab Results  Component Value Date   HDL 58.10 10/22/2015   No results found for: [facility] Lab Results  Component Value Date   TRIG 258.0 (H) 10/22/2015   Lab Results  Component Value Date   CHOLHDL 4 10/22/2015   Lab Results  Component Value Date   HGBA1C 6.1 10/22/2015       Assessment & Plan:   Problem List Items Addressed This Visit    Hyperlipidemia, mixed    Encouraged heart healthy diet, increase exercise, avoid trans fats, consider a krill oil cap daily      Hyperglycemia     minimize simple carbs. Increase exercise as tolerated.        Insomnia    Encouraged good sleep hygiene such as dark, quiet room. No blue/green glowing lights such as computer screens in bedroom. No alcohol or stimulants in evening. Cut down on caffeine as able. Regular exercise is helpful but not just prior to bed time.       Obesity    S/p gastric surgery in November, doing well. Is maintaining a stready weight loss.          I have discontinued Ms. Duerr's famotidine and montelukast. I am also having her  maintain her calcium carbonate, Pediatric Multiple Vitamins (FLINTSTONES MULTIVITAMIN PO), albuterol, albuterol, cetirizine, acarbose, omeprazole, Cyanocobalamin (B12 FAST DISSOLVE PO), and ursodiol.  Meds ordered this encounter  Medications  . omeprazole (PRILOSEC) 20 MG capsule    Sig: Take 20 mg by mouth daily.  . Cyanocobalamin (B12 FAST DISSOLVE PO)    Sig: Take by mouth.  . ursodiol (ACTIGALL) 300 MG capsule    Sig: Take 300 mg by mouth 2 (two) times daily.     Danise Edge, MD

## 2016-09-09 NOTE — Assessment & Plan Note (Signed)
Encouraged heart healthy diet, increase exercise, avoid trans fats, consider a krill oil cap daily 

## 2016-09-09 NOTE — Assessment & Plan Note (Signed)
minimize simple carbs. Increase exercise as tolerated.  

## 2016-09-09 NOTE — Assessment & Plan Note (Signed)
Encouraged good sleep hygiene such as dark, quiet room. No blue/green glowing lights such as computer screens in bedroom. No alcohol or stimulants in evening. Cut down on caffeine as able. Regular exercise is helpful but not just prior to bed time.  

## 2016-09-09 NOTE — Assessment & Plan Note (Signed)
S/p gastric surgery in November, doing well. Is maintaining a stready weight loss.

## 2016-09-22 LAB — HM PAP SMEAR: HM Pap smear: NORMAL

## 2016-10-09 ENCOUNTER — Telehealth: Payer: Self-pay | Admitting: Family Medicine

## 2016-10-09 MED ORDER — OSELTAMIVIR PHOSPHATE 75 MG PO CAPS: 75.0000 mg | ORAL_CAPSULE | Freq: Every day | ORAL | 0 refills | Status: DC

## 2016-10-09 NOTE — Telephone Encounter (Signed)
Sent in tamiflu to [facility] [place] Patient informed refill done

## 2016-10-09 NOTE — Telephone Encounter (Signed)
Patient called stating that her son was diagnosed with the flu and the pediatrician recommends that she get a prescription for tamiflu. 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 Please advise  Phone: [facility] [phone] - [place], [place] - [address]

## 2016-10-09 NOTE — Telephone Encounter (Signed)
Tamiflu 75 mg tab, 1 tab po daily x 10 days

## 2017-02-11 IMAGING — RF DG UGI W/ HIGH DENSITY W/KUB
18 of 23 series · 18 of 23 positions shown · non-contrast
Comparison: None.

CLINICAL DATA: Morbid obesity, pre bariatric surgery, evaluate for
hiatal hernia

EXAM:
UPPER GI SERIES WITH KUB
TECHNIQUE: After obtaining a scout radiograph a routine upper GI series was
performed using thin and high density barium.
FLUOROSCOPY TIME:  Radiation Exposure Index (as provided by the
fluoroscopic device): 140 deciGy per square cm
If the device does not provide the exposure index:
Fluoroscopy Time (in minutes and seconds):  1 minutes 42 second
Number of Acquired Images:

[Series 3: run · 1 of 1 slices shown (1 of 17)]
[im 1/1]
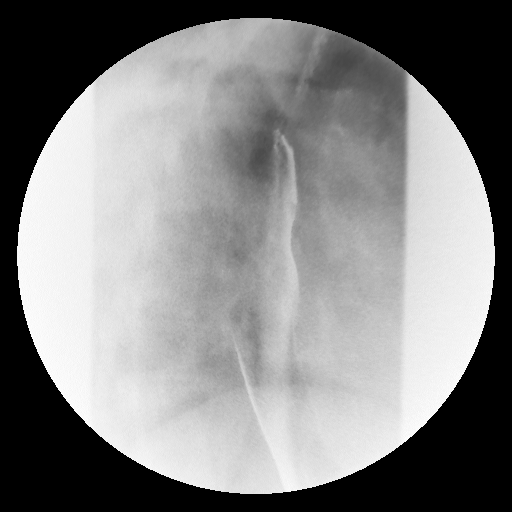

[Series 4: run · 1 of 1 slices shown (2 of 17)]
[im 1/1]
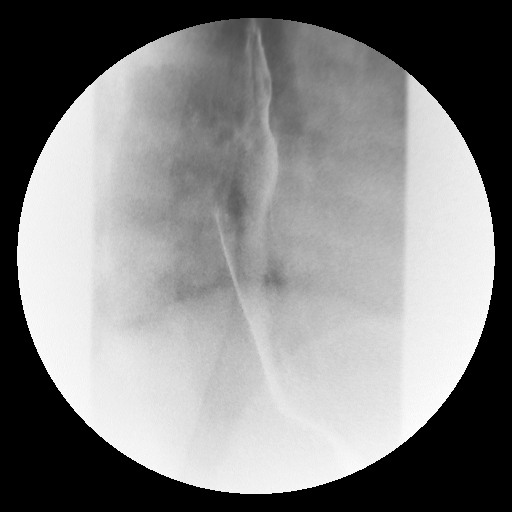

[Series 6: run · 1 of 1 slices shown (3 of 17)]
[im 1/1]
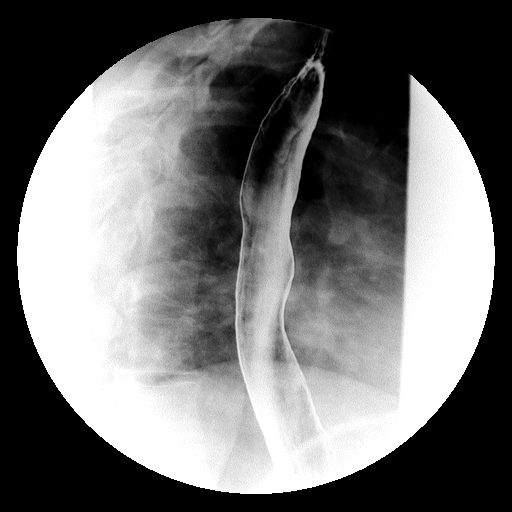

[Series 7: run · 1 of 1 slices shown (4 of 17)]
[im 1/1]
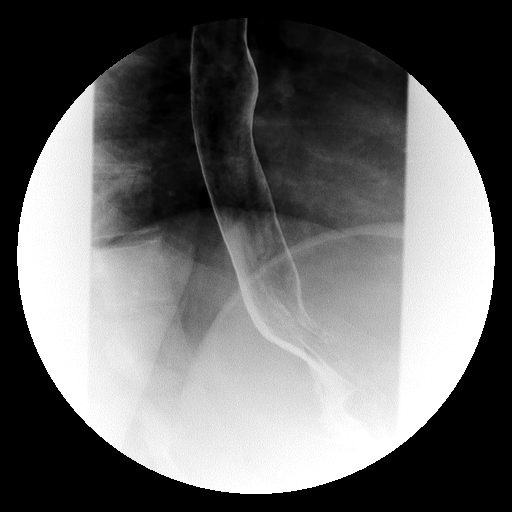

[Series 8: run · 1 of 1 slices shown (5 of 17)]
[im 1/1]
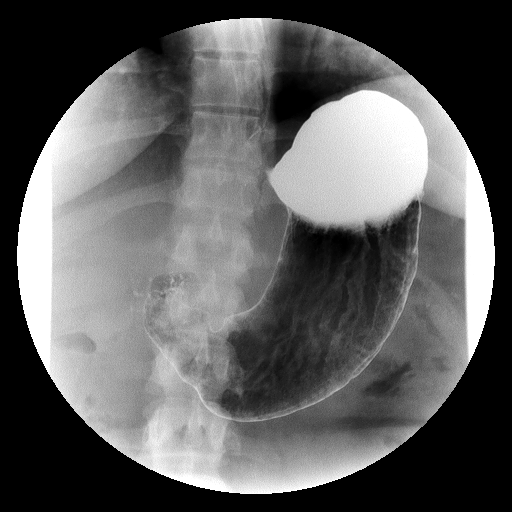

[Series 10: run · 1 of 1 slices shown (6 of 17)]
[im 1/1]
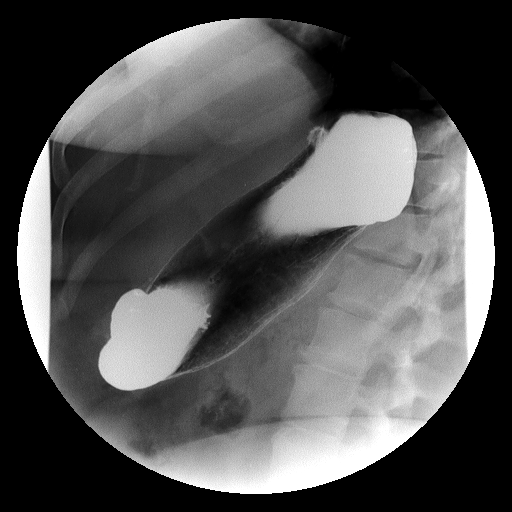

[Series 11: run · 1 of 1 slices shown (7 of 17)]
[im 1/1]
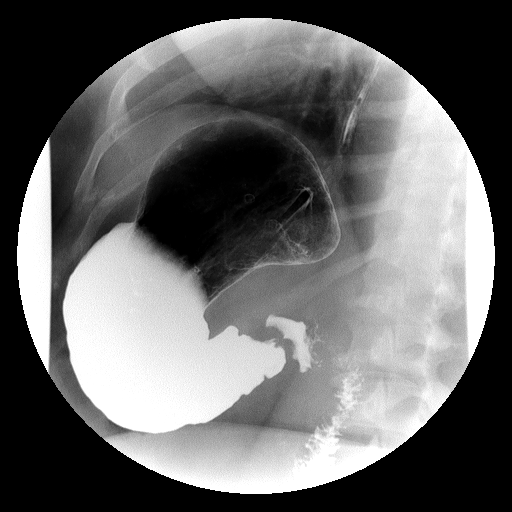

[Series 12: run · 1 of 1 slices shown (8 of 17)]
[im 1/1]
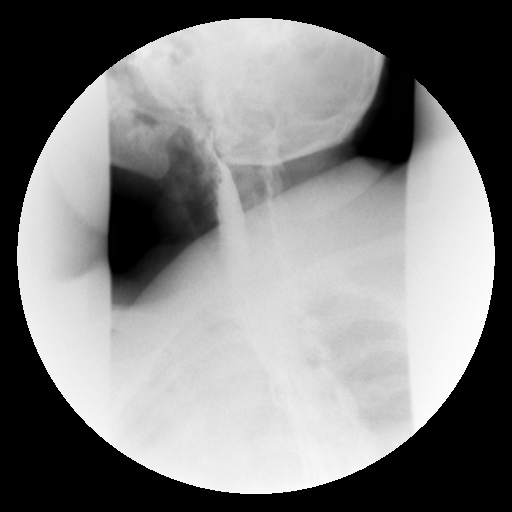

[Series 13: run · 1 of 1 slices shown (9 of 17)]
[im 1/1]
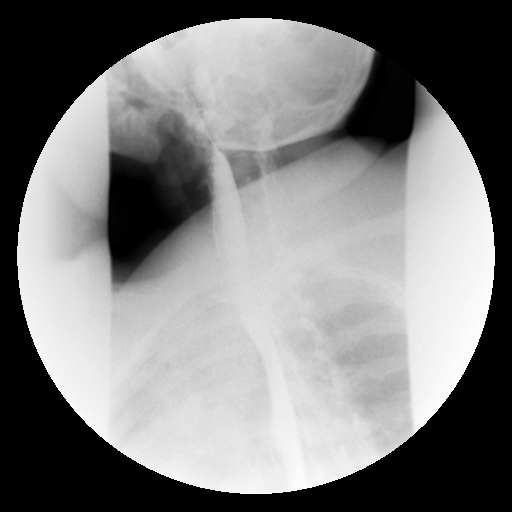

[Series 16: run · 1 of 1 slices shown (10 of 17)]
[im 1/1]
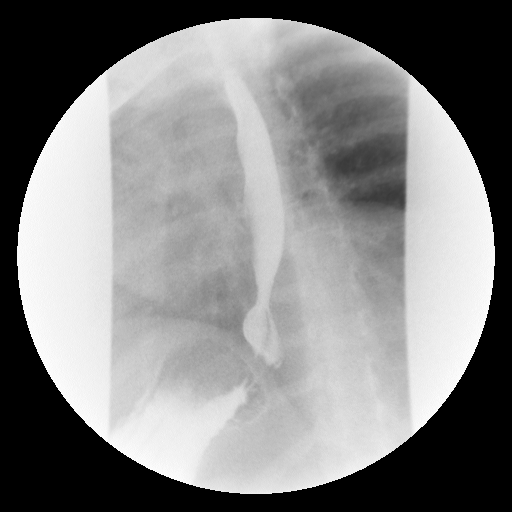

[Series 17: run · 1 of 1 slices shown (11 of 17)]
[im 1/1]
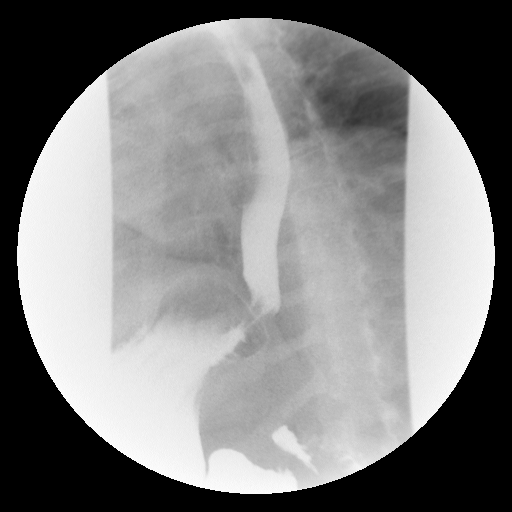

[Series 18: run · 1 of 1 slices shown (12 of 17)]
[im 1/1]
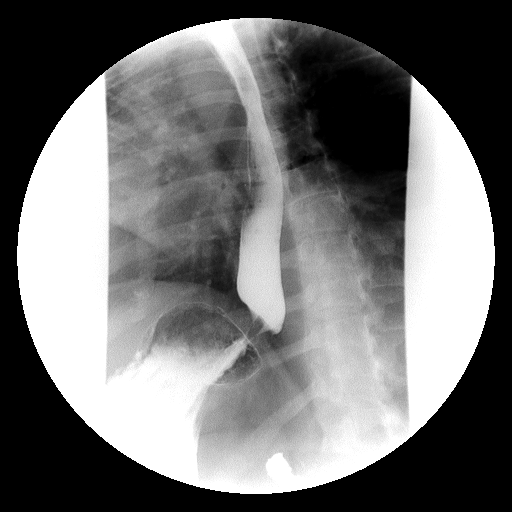

[Series 19: run · 1 of 1 slices shown (13 of 17)]
[im 1/1]
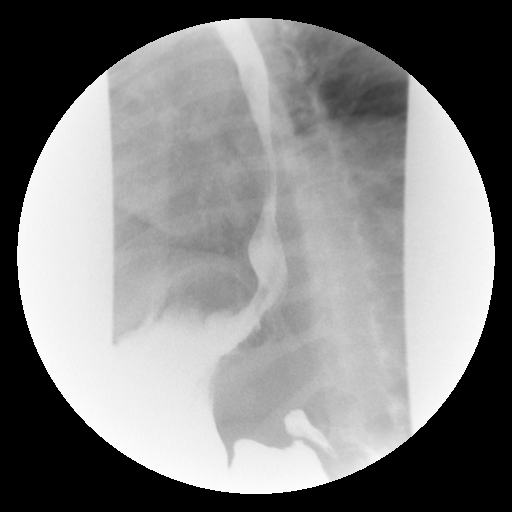

[Series 21: run · 1 of 1 slices shown (14 of 17)]
[im 1/1]
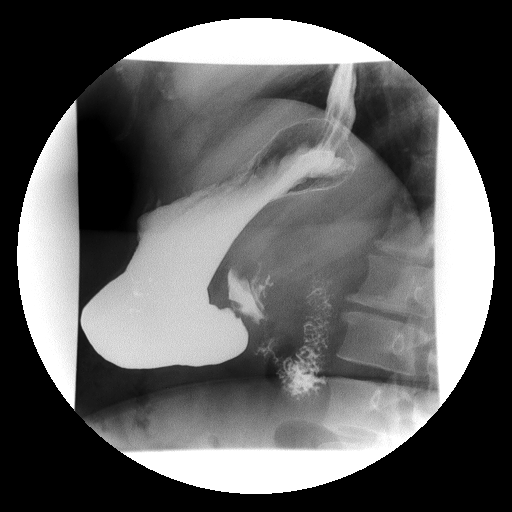

[Series 22: run · 1 of 1 slices shown (15 of 17)]
[im 1/1]
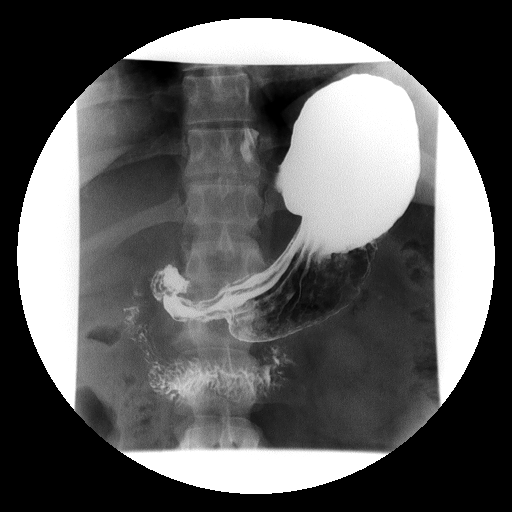

[Series 23: run · 1 of 1 slices shown (16 of 17)]
[im 1/1]
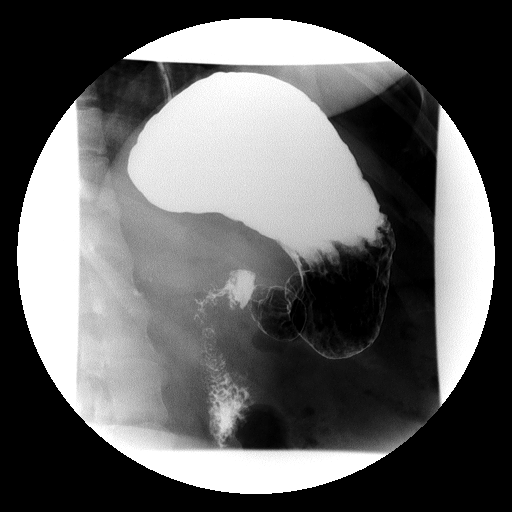

[Series 25: run · 1 of 1 slices shown (17 of 17)]
[im 1/1]
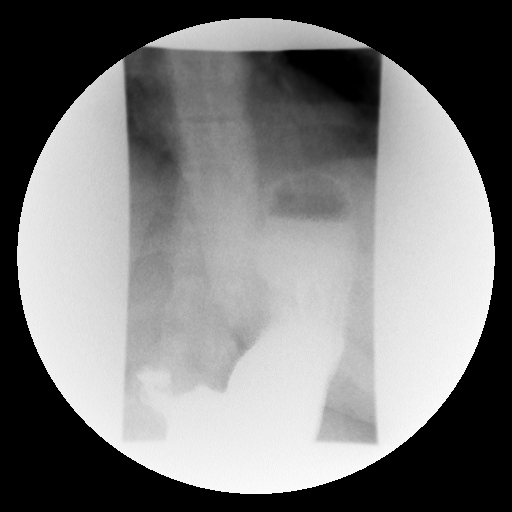

[Series 1001: view not recorded · 0.20mm/px · 1 of 1 slices shown]
[im 1/1]
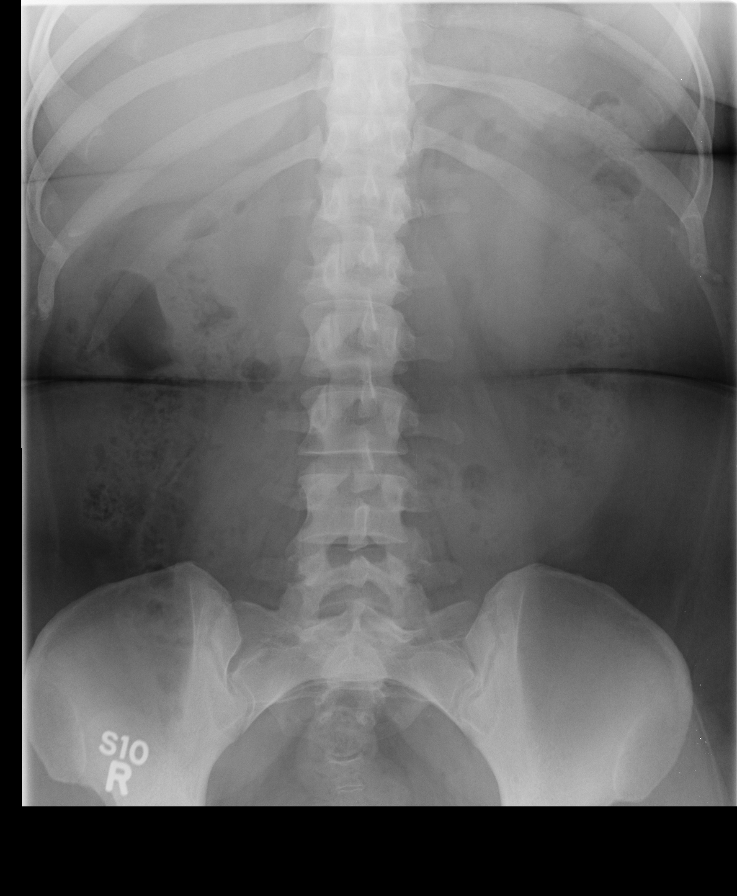

[18 of 23 positions shown; findings below may reference images not displayed]

FINDINGS: A preliminary film of the abdomen shows a nonspecific bowel gas
pattern. No opaque calculi are seen. The bones are unremarkable.

A double-contrast upper GI was performed. The mucosa of the
esophagus is unremarkable. A single contrast study shows the
swallowing mechanism to be normal. No hiatal hernia is seen.
Moderate gastroesophageal reflux is demonstrated. A barium pill was
given at the end of the study which passed into the stomach without
delay.

The stomach is normal in contour and peristalsis. The duodenal bulb
fills and the duodenal loop is in normal position.
IMPRESSION: 1. Moderate gastroesophageal reflux. No hiatal hernia. Barium pill
passes into the stomach without delay.
2. No abnormality of the stomach or duodenum is seen.

## 2017-02-24 ENCOUNTER — Encounter: Payer: BC Managed Care – PPO | Admitting: Family Medicine

## 2017-05-19 ENCOUNTER — Encounter: Payer: BC Managed Care – PPO | Admitting: Family Medicine

## 2017-05-19 DIAGNOSIS — Z0289 Encounter for other administrative examinations: Secondary | ICD-10-CM

## 2017-09-07 ENCOUNTER — Encounter: Payer: BC Managed Care – PPO | Admitting: Family Medicine

## 2017-09-10 ENCOUNTER — Encounter: Payer: Self-pay | Admitting: Family Medicine

## 2017-09-10 ENCOUNTER — Ambulatory Visit: Payer: BC Managed Care – PPO | Admitting: Family Medicine

## 2017-09-10 DIAGNOSIS — J45991 Cough variant asthma: Secondary | ICD-10-CM

## 2017-09-10 DIAGNOSIS — I495 Sick sinus syndrome: Secondary | ICD-10-CM | POA: Diagnosis not present

## 2017-09-10 DIAGNOSIS — R252 Cramp and spasm: Secondary | ICD-10-CM

## 2017-09-10 DIAGNOSIS — E6609 Other obesity due to excess calories: Secondary | ICD-10-CM | POA: Diagnosis not present

## 2017-09-10 DIAGNOSIS — R739 Hyperglycemia, unspecified: Secondary | ICD-10-CM

## 2017-09-10 DIAGNOSIS — E782 Mixed hyperlipidemia: Secondary | ICD-10-CM

## 2017-09-10 NOTE — Assessment & Plan Note (Signed)
Mild and very rare exacerbations. Responds well to Albuterol as needed.

## 2017-09-10 NOTE — Assessment & Plan Note (Signed)
Hydrate well. Consider hyland's leg cramp med. Magnesium prn

## 2017-09-10 NOTE — Assessment & Plan Note (Deleted)
Encouraged heart healthy diet, increase exercise, avoid trans fats, consider a krill oil cap daily 

## 2017-09-10 NOTE — Assessment & Plan Note (Addendum)
Encouraged DASH diet, decrease po intake and increase exercise as tolerated. Needs 7-8 hours of sleep nightly. Avoid trans fats, eat small, frequent meals every 4-5 hours with lean proteins, complex carbs and healthy fats. Minimize simple carbs, Has had good weight loss s/p bariatric surgery

## 2017-09-10 NOTE — Assessment & Plan Note (Signed)
Had a very distant history> of a possible episode of syncope. [facility] there has been no recurrence. Several years ago a mistake was made and the patient's forms were filled out stating she had a pace maker which she does not have nor has she ever had one. A letter is written for the patient today confirming she does not have one.

## 2017-09-10 NOTE — Assessment & Plan Note (Signed)
minimize simple carbs. Increase exercise as tolerated.  

## 2017-09-10 NOTE — Patient Instructions (Signed)

## 2017-09-10 NOTE — Progress Notes (Signed)
Subjective:  I acted as a Neurosurgeonscribe for [name]. [name], RMA   Patient ID: Veronica HearingElizabeth H Stokes, female    DOB: [birth year], 42 y.o.   MRN: [id number]  No chief complaint on file.   HPI  [facility] She feels well today.  No recent febrile illness or acute hospitalizations.  She is very happy with her weight loss status post her bariatric surgery.  Her weight loss has stabilized some but she is hoping to lose more weight still.  Is following a heart healthy diet.  Is here today to have forms filled [facility] to a error that was made on her forms a couple of years ago stating she has a pacemaker.  She does not nor has she ever had one.  She had a very distant history of a syncopal episode over 10 years ago but no recurrence and no other acute concerns.  Her asthma is well controlled and she very rarely needs albuterol.  When it is necessary it works well. Denies CP/palp/SOB/HA/congestion/fevers/GI or GU c/o. Taking meds as prescribed  Patient Care Team: Bradd CanaryBlyth, [name], MD as PCP - General (Family Medicine)   Past Medical History:  Diagnosis Date  . Allergic state 12/18/2008   Qualifier: Diagnosis of  By: [name], [name]    . Asthma    cough variant  . Chicken pox as a child  . Hyperglycemia 10/28/2015  . Hyperlipidemia, mixed 10/28/2015  . Insomnia 09/09/2016  . Obesity 09/09/2016  . Polycystic ovarian syndrome 8-12  . Preventative health care 10/28/2015    Past Surgical History:  Procedure Laterality Date  . KNEE SURGERY  12-94, 10-07   X 2 left  . WISDOM TOOTH EXTRACTION  2001    Family History  Problem Relation Age of Onset  . Diabetes Mother        type 2  . Hypertension Mother   . Diabetes Father        type 2- off of everything since gastric bypass  . Mental illness Sister        bipolar, ADHD  . Mental illness Maternal Grandmother        manic and bipolar  . Stroke Maternal Grandmother   . Alcohol abuse Maternal  Grandmother   . Stokes loss Maternal Grandmother   . Heart disease Maternal Grandfather   . Heart attack Maternal Grandfather   . Diabetes Paternal Grandmother        type 2  . Stroke Paternal Grandmother   . Thyroid disease Paternal Grandmother   . Stroke Paternal Grandfather   . Heart attack Paternal Grandfather   . Cancer Maternal Aunt 50       breast  . Cancer Maternal Aunt 1750       breast     Social History   Socioeconomic History  . Marital status: Married    Spouse name: Not on file  . Number of children: Not on file  . Years of education: Not on file  . Highest education level: Not on file  Social Needs  . Financial resource strain: Not on file  . Food insecurity - worry: Not on file  . Food insecurity - inability: Not on file  . Transportation needs - medical: Not on file  . Transportation needs - non-medical: Not on file  Occupational History  . Not on file  Tobacco Use  . Smoking status: Never Smoker  . Smokeless tobacco: Never Used  Substance and Sexual Activity  . Alcohol use: No  . Drug use: No  . 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 Not on file  Social History Narrative  . Not on file    Outpatient Medications Prior to Visit  Medication Sig Dispense Refill  . acarbose (PRECOSE) 25 MG tablet Take 1 tablet (25 mg total) by mouth 3 (three) times daily with meals. 90 tablet 5  . albuterol (PROVENTIL HFA;VENTOLIN HFA) 108 (90 BASE) MCG/ACT inhaler Inhale 2 puffs into the lungs every 6 (six) hours as needed for wheezing. 18 g 1  . albuterol (PROVENTIL) (2.5 MG/3ML) 0.083% nebulizer solution Take 3 mLs (2.5 mg total) by nebulization every 4 (four) hours as needed for wheezing or shortness of breath. 75 mL 1  . calcium carbonate (OS-CAL) 600 MG TABS Take 600 mg by mouth daily.    . cetirizine (ZYRTEC ALLERGY) 10 MG tablet Take 10 mg by mouth daily.     . Cyanocobalamin (B12 FAST DISSOLVE PO) Take by mouth.    [name] omeprazole (PRILOSEC) 20 MG capsule Take 20 mg by mouth daily.    [name] oseltamivir (TAMIFLU) 75 MG capsule Take 1 capsule (75 mg total) by mouth daily. 10 capsule 0  . Pediatric Multiple Vitamins (FLINTSTONES MULTIVITAMIN PO) Take 2 tablets by mouth.    . ursodiol (ACTIGALL) 300 MG capsule Take 300 mg by mouth 2 (two) times daily.     No facility-administered medications prior to visit.     Allergies  Allergen Reactions  . Corticosteroids Anaphylaxis  . Eggs Or Egg-Derived Products   . Latex   . Metformin And Related Nausea And Vomiting  . Penicillins     REACTION: SWELLING  . Sulfa Antibiotics Itching    Review of Systems  Constitutional: Negative for fever and malaise/fatigue.  HENT: Negative for congestion.   Eyes: Negative for blurred vision.  Respiratory: Negative for shortness of breath.   Cardiovascular: Negative for chest pain, palpitations and leg swelling.  Gastrointestinal: Negative for abdominal pain, blood in stool and nausea.  Genitourinary: Negative for dysuria and frequency.  Musculoskeletal: Negative for falls.  Skin: Negative for rash.  Neurological: Negative for dizziness, loss of consciousness and headaches.  Endo/Heme/Allergies: Negative for environmental allergies.  Psychiatric/Behavioral: Negative for depression. The patient is not nervous/anxious.        Objective:    Physical Exam  Constitutional: She is oriented to person, place, and time. She appears well-developed and well-nourished. No distress.  HENT:  Head: Normocephalic and atraumatic.  Nose: Nose normal.  Eyes: Right eye exhibits no discharge. Left eye exhibits no discharge.  Neck: Normal range of motion. Neck supple.  Cardiovascular: Normal rate and regular rhythm.  No murmur heard. Pulmonary/Chest: Effort normal and breath sounds normal.  No pacemaker palpable in chest wall  Abdominal: Soft. Bowel sounds are normal. There is  no tenderness.  Musculoskeletal: She exhibits no edema.  Neurological: She is alert and oriented to person, place, and time.  Skin: Skin is warm and dry.  Psychiatric: She has a normal mood and affect.  Nursing note and vitals reviewed.   BP (!) 148/88   Pulse 80   Temp 98.2 F (36.8 C) (Oral)   Resp 16   Wt 160 lb (72.6 kg)   SpO2 100%   BMI 32.32 kg/m  Wt Readings from Last 3 Encounters:  09/10/17 160 lb (72.6 kg)  08/19/16 176 lb 6.4 oz (80 kg)  10/22/15 207 lb 8 oz (94.1 kg)   BP Readings from Last 3 Encounters:  09/10/17 (!) 148/88  08/19/16 120/80  10/22/15 132/78     Immunization History  Administered Date(s) Administered  . PPD Test 08/04/2012  . Td 09/09/2003  . Tetanus 09/08/2001    Health Maintenance  Topic Date Due  . HIV Screening  02/21/1991  . TETANUS/TDAP  09/08/2013  . PAP SMEAR  05/16/2016  . INFLUENZA VACCINE  05/13/2018 (Originally 04/08/2017)    Lab Results  Component Value Date   WBC 9.9 10/22/2015   HGB 12.7 10/22/2015   HCT 37.2 10/22/2015   PLT 350.0 10/22/2015   GLUCOSE 113 (H) 10/22/2015   CHOL 250 (H) 10/22/2015   TRIG 258.0 (H) 10/22/2015   HDL 58.10 10/22/2015   LDLDIRECT 161.0 10/22/2015   ALT 21 10/22/2015   AST 18 10/22/2015   NA 138 10/22/2015   K 3.9 10/22/2015   CL 102 10/22/2015   CREATININE 0.76 10/22/2015   BUN 16 10/22/2015   CO2 28 10/22/2015   TSH 2.51 10/22/2015   HGBA1C 6.1 10/22/2015    Lab Results  Component Value Date   TSH 2.51 10/22/2015   Lab Results  Component Value Date   WBC 9.9 10/22/2015   HGB 12.7 10/22/2015   HCT 37.2 10/22/2015   MCV 80.5 10/22/2015   PLT 350.0 10/22/2015   Lab Results  Component Value Date   NA 138 10/22/2015   K 3.9 10/22/2015   CO2 28 10/22/2015   GLUCOSE 113 (H) 10/22/2015   BUN 16 10/22/2015   CREATININE 0.76 10/22/2015   BILITOT 0.3 10/22/2015   ALKPHOS 58 10/22/2015   AST 18 10/22/2015   ALT 21 10/22/2015   PROT 7.4 10/22/2015   ALBUMIN 4.5  10/22/2015   CALCIUM 9.3 10/22/2015   GFR 89.74 10/22/2015   Lab Results  Component Value Date   CHOL 250 (H) 10/22/2015   Lab Results  Component Value Date   HDL 58.10 10/22/2015   No results found for: [facility] Lab Results  Component Value Date   TRIG 258.0 (H) 10/22/2015   Lab Results  Component Value Date   CHOLHDL 4 10/22/2015   Lab Results  Component Value Date   HGBA1C 6.1 10/22/2015         Assessment & Plan:   Problem List Items Addressed This Visit    BRADYCARDIA-TACHYCARDIA SYNDROME    Had a very distant history> of a possible episode of syncope. She has had to fill out [facility] paperwork ever since even though there has been no recurrence. Several years ago a mistake was made and the patient's forms were filled out stating she had a pace maker which she does not have nor has she ever had one. A letter is written for the patient today confirming she does not have one.       Cough variant asthma    Mild and very rare exacerbations. Responds well to Albuterol as needed.      Hyperlipidemia, mixed   Hyperglycemia    minimize simple carbs. Increase exercise as tolerated.       Leg cramp    Hydrate well. Consider hyland's leg cramp med. Magnesium prn      Obesity    Encouraged DASH diet, decrease po intake and increase exercise as tolerated. Needs 7-8 hours of sleep nightly. Avoid trans fats, eat small, frequent meals every 4-5  hours with lean proteins, complex carbs and healthy fats. Minimize simple carbs, Has had good weight loss s/p bariatric surgery         I am having Ruben Gottron. Alen "[name]" maintain her calcium carbonate, Pediatric Multiple Vitamins (FLINTSTONES MULTIVITAMIN PO), albuterol, albuterol, cetirizine, acarbose, omeprazole, Cyanocobalamin (B12 FAST DISSOLVE PO), ursodiol, and oseltamivir.  No orders of the defined types were placed in this encounter.   CMA served as Neurosurgeon during this visit. History, Physical and Plan performed by  medical provider. Documentation and orders reviewed and attested to.  Danise Edge, MD

## 2017-09-15 ENCOUNTER — Encounter: Payer: Self-pay | Admitting: Family Medicine

## 2017-09-24 ENCOUNTER — Other Ambulatory Visit: Payer: Self-pay

## 2017-09-24 MED ORDER — METOPROLOL SUCCINATE ER 25 MG PO TB24: 25.0000 mg | ORAL_TABLET | Freq: Every day | ORAL | 2 refills | Status: DC

## 2017-09-24 NOTE — Telephone Encounter (Signed)
Patient notified

## 2017-10-09 ENCOUNTER — Telehealth: Payer: Self-pay

## 2017-10-09 NOTE — Telephone Encounter (Signed)
Entered in error

## 2017-10-15 ENCOUNTER — Encounter: Payer: Self-pay | Admitting: Family Medicine

## 2017-10-15 DIAGNOSIS — I159 Secondary hypertension, unspecified: Secondary | ICD-10-CM

## 2017-10-16 ENCOUNTER — Telehealth: Payer: Self-pay

## 2017-10-16 MED ORDER — CHLORTHALIDONE 25 MG PO TABS
25.0000 mg | ORAL_TABLET | Freq: Every day | ORAL | 0 refills | Status: DC
Start: 2017-10-16 — End: 2017-11-02

## 2017-10-16 NOTE — Addendum Note (Signed)
Addended by: Crissie SicklesARTER, Masha Orbach A on: 10/16/2017 11:09 AM   Modules accepted: Orders

## 2017-10-16 NOTE — Telephone Encounter (Signed)
Copied from CRM [phone]. Topic: General - Other >> Oct 09, 2017 10:50 AM Cipriano BunkerLambe, Annette S wrote: Reason for CRM: [facility] is calling[facility] 09/10/17,  she saw doctor fax it.  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  Please print out copy for her to pick up too if possible.  Please call you when done  >> Oct 14, 2017  5:51 PM Cipriano BunkerLambe, Annette S wrote: She is asking to see if letter can be emailed to and sent in [employer]  [email or web address]

## 2017-10-19 NOTE — Telephone Encounter (Signed)
Sent paper over several times and the fax continues to fail  Patient notified

## 2017-11-02 ENCOUNTER — Telehealth: Payer: Self-pay | Admitting: Family Medicine

## 2017-11-02 ENCOUNTER — Encounter: Payer: BC Managed Care – PPO | Admitting: Family Medicine

## 2017-11-02 MED ORDER — CHLORTHALIDONE 25 MG PO TABS: 25.0000 mg | ORAL_TABLET | Freq: Every day | ORAL | 0 refills | Status: DC

## 2017-11-02 NOTE — Telephone Encounter (Signed)
Copied from CRM [phone]. Topic: Quick Communication - Rx Refill/Question >> Nov 02, 2017 10:52 AM Arlyss Gandyichardson, Aurorah Schlachter N, NT wrote: Medication: chlorthalidone (HYGROTON)   Has the patient contacted their pharmacy? Yes.     (Agent: If no, request that the patient contact the pharmacy for the refill.)   Preferred Pharmacy (with phone number or street name[place] [facility] [place]. Pt coming for a bp check on 11/19/17. Would like to see if she can have a partial refill to last until that appt. Will be out of medication on 11/17/17.    Agent: Please be advised that RX refills may take up to 3 business days. We ask that you follow-up with your pharmacy.

## 2017-11-02 NOTE — Telephone Encounter (Signed)
chlorthalidone refill Last OV: 10/16/17 Last Refill:10/15/17 Pharmacy:[facility] [place]

## 2017-11-19 ENCOUNTER — Ambulatory Visit: Payer: BC Managed Care – PPO

## 2017-11-25 ENCOUNTER — Ambulatory Visit: Payer: BC Managed Care – PPO

## 2017-12-01 ENCOUNTER — Encounter: Payer: BC Managed Care – PPO | Admitting: Family Medicine

## 2017-12-04 ENCOUNTER — Ambulatory Visit (INDEPENDENT_AMBULATORY_CARE_PROVIDER_SITE_OTHER): Payer: BC Managed Care – PPO

## 2017-12-04 VITALS — BP 110/75 | HR 88 | Resp 18

## 2017-12-04 DIAGNOSIS — I159 Secondary hypertension, unspecified: Secondary | ICD-10-CM | POA: Insufficient documentation

## 2017-12-04 MED ORDER — CHLORTHALIDONE 25 MG PO TABS: 25.0000 mg | ORAL_TABLET | Freq: Every day | ORAL | 1 refills | Status: DC

## 2017-12-04 NOTE — Progress Notes (Signed)
Patient came in for a blood pressure check. She states she has not missed any doses. She is taking: chlorthalidone (HYGROTON) 25 MG tablet [[id number]][[id number]]  Daily  No missed doses.  Taking today around 11am BP: 110/75  Pulse 88  O2: 98   Per Dr.Lowne she is to continue her same dose and follow up with PCP at her next appt.   Patient agreed and voiced her understanding

## 2018-01-21 ENCOUNTER — Ambulatory Visit (INDEPENDENT_AMBULATORY_CARE_PROVIDER_SITE_OTHER): Payer: BC Managed Care – PPO | Admitting: Family Medicine

## 2018-01-21 ENCOUNTER — Encounter: Payer: Self-pay | Admitting: Family Medicine

## 2018-01-21 DIAGNOSIS — I495 Sick sinus syndrome: Secondary | ICD-10-CM | POA: Diagnosis not present

## 2018-01-21 DIAGNOSIS — Z0000 Encounter for general adult medical examination without abnormal findings: Secondary | ICD-10-CM

## 2018-01-21 DIAGNOSIS — R739 Hyperglycemia, unspecified: Secondary | ICD-10-CM

## 2018-01-21 DIAGNOSIS — E782 Mixed hyperlipidemia: Secondary | ICD-10-CM

## 2018-01-21 DIAGNOSIS — E6609 Other obesity due to excess calories: Secondary | ICD-10-CM

## 2018-01-21 LAB — CBC
HCT: 37.4 % (ref 36.0–46.0)
Hemoglobin: 12.5 g/dL (ref 12.0–15.0)
MCHC: 33.5 g/dL (ref 30.0–36.0)
MCV: 82.7 fl (ref 78.0–100.0)
Platelets: 309 10*3/uL (ref 150.0–400.0)
RBC: 4.52 Mil/uL (ref 3.87–5.11)
RDW: 13.4 % (ref 11.5–15.5)
WBC: 6 10*3/uL (ref 4.0–10.5)

## 2018-01-21 LAB — TSH: TSH: 1.57 u[IU]/mL (ref 0.35–4.50)

## 2018-01-21 LAB — HEMOGLOBIN A1C: Hgb A1c MFr Bld: 5.7 % (ref 4.6–6.5)

## 2018-01-21 LAB — T4, FREE: Free T4: 0.75 ng/dL (ref 0.60–1.60)

## 2018-01-21 MED ORDER — ROSUVASTATIN CALCIUM 5 MG PO TABS: 5.0000 mg | ORAL_TABLET | ORAL | 1 refills | Status: DC

## 2018-01-21 NOTE — Patient Instructions (Signed)
Preventive Care 18-39 Years, Female Preventive care refers to lifestyle choices and visits with your health care provider that can promote health and wellness. What does preventive care include?  A yearly physical exam. This is also called an annual well check.  Dental exams once or twice a year.  Routine eye exams. Ask your health care provider how often you should have your eyes checked.  Personal lifestyle choices, including: ? Daily care of your teeth and gums. ? Regular physical activity. ? Eating a healthy diet. ? Avoiding tobacco and drug use. ? Limiting alcohol use. ? Practicing safe sex. ? Taking vitamin and mineral supplements as recommended by your health care provider. What happens during an annual well check? The services and screenings done by your health care provider during your annual well check will depend on your age, overall health, lifestyle risk factors, and family history of disease. Counseling Your health care provider may ask you questions about your:  Alcohol use.  Tobacco use.  Drug use.  Emotional well-being.  Home and relationship well-being.  Sexual activity.  Eating habits.  Work and work Statistician.  Method of birth control.  Menstrual cycle.  Pregnancy history.  Screening You may have the following tests or measurements:  Height, weight, and BMI.  Diabetes screening. This is done by checking your blood sugar (glucose) after you have not eaten for a while (fasting).  Blood pressure.  Lipid and cholesterol levels. These may be checked every 5 years starting at age 66.  Skin check.  Hepatitis C blood test.  Hepatitis B blood test.  Sexually transmitted disease (STD) testing.  BRCA-related cancer screening. This may be done if you have a family history of breast, ovarian, tubal, or peritoneal cancers.  Pelvic exam and Pap test. This may be done every 3 years starting at age 40. Starting at age 59, this may be done every 5  years if you have a Pap test in combination with an HPV test.  Discuss your test results, treatment options, and if necessary, the need for more tests with your health care provider. Vaccines Your health care provider may recommend certain vaccines, such as:  Influenza vaccine. This is recommended every year.  Tetanus, diphtheria, and acellular pertussis (Tdap, Td) vaccine. You may need a Td booster every 10 years.  Varicella vaccine. You may need this if you have not been vaccinated.  HPV vaccine. If you are 69 or younger, you may need three doses over 6 months.  Measles, mumps, and rubella (MMR) vaccine. You may need at least one dose of MMR. You may also need a second dose.  Pneumococcal 13-valent conjugate (PCV13) vaccine. You may need this if you have certain conditions and were not previously vaccinated.  Pneumococcal polysaccharide (PPSV23) vaccine. You may need one or two doses if you smoke cigarettes or if you have certain conditions.  Meningococcal vaccine. One dose is recommended if you are age 27-21 years and a first-year college student living in a residence hall, or if you have one of several medical conditions. You may also need additional booster doses.  Hepatitis A vaccine. You may need this if you have certain conditions or if you travel or work in places where you may be exposed to hepatitis A.  Hepatitis B vaccine. You may need this if you have certain conditions or if you travel or work in places where you may be exposed to hepatitis B.  Haemophilus influenzae type b (Hib) vaccine. You may need this if  you have certain risk factors.  Talk to your health care provider about which screenings and vaccines you need and how often you need them. This information is not intended to replace advice given to you by your health care provider. Make sure you discuss any questions you have with your health care provider. Document Released: 10/21/2001 Document Revised: 05/14/2016  Document Reviewed: 06/26/2015 Elsevier Interactive Patient Education  [employer].

## 2018-01-21 NOTE — Progress Notes (Signed)
Subjective:  I acted as a Neurosurgeon for Dr. Abner Greenspan. Veronica Stokes, [place]  Patient ID: Veronica Stokes, female    DOB: 1976/07/23, 42 y.o.   MRN: [id number]  Chief Complaint  Patient presents with  . Annual Exam    HPI  Patient is in today for an annual exam. She is following up on her HTN, hyperlipidemia, hyperglycemia, and other medical concerns. She has no acute concerns. She follows with [facility] for her paps and MGMs and has no concerns in this regard. No recent febrile illness or hospitalizations. She stays active and maintains a heart healthy diet and she stays active. Denies CP/palp/SOB/HA/congestion/fevers/GI or GU c/o. Taking meds as prescribed  Patient Care Team: Bradd Canary, MD as PCP - General (Family Medicine)   Past Medical History:  Diagnosis Date  . Allergic state 12/18/2008   Qualifier: Diagnosis of  By: Beverely Low MD, Natalia Leatherwood    . Asthma    cough variant  . Chicken pox as a child  . Hyperglycemia 10/28/2015  . Hyperlipidemia, mixed 10/28/2015  . Insomnia 09/09/2016  . Obesity 09/09/2016  . Polycystic ovarian syndrome 8-12  . Preventative health care 10/28/2015    Past Surgical History:  Procedure Laterality Date  . KNEE SURGERY  12-94, 10-07   X 2 left  . WISDOM TOOTH EXTRACTION  2001    Family History  Problem Relation Age of Onset  . Diabetes Mother        type 2  . Hypertension Mother   . Diabetes Father        type 2- off of everything since gastric bypass  . Mental illness Sister        bipolar, ADHD  . Mental illness Maternal Grandmother        manic and bipolar  . Stroke Maternal Grandmother   . Alcohol abuse Maternal Grandmother   . Hearing loss Maternal Grandmother   . Heart disease Maternal Grandfather   . Heart attack Maternal Grandfather   . Diabetes Paternal Grandmother        type 2  . Stroke Paternal Grandmother   . Thyroid disease Paternal Grandmother   . Stroke Paternal Grandfather   . Heart attack Paternal Grandfather   .  Cancer Maternal Aunt 50       breast  . Cancer Maternal Aunt 52       breast     Social History   Socioeconomic History  . Marital status: Married    Spouse name: Not on file  . Number of children: Not on file  . Years of education: Not on file  . Highest education level: Not on file  Occupational History  . Not on file  Social Needs  . Financial resource strain: Not on file  . Food insecurity:    Worry: Not on file    Inability: Not on file  . Transportation needs:    Medical: Not on file    Non-medical: Not on file  Tobacco Use  . Smoking status: Never Smoker  . Smokeless tobacco: Never Used  Substance and Sexual Activity  . Alcohol use: No  . Drug use: No  . 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 Physical activity:    Days per week: Not on file    Minutes per session: Not on file  . Stress: Not on file  Relationships  .  Social connections:    Talks on phone: Not on file    Gets together: Not on file    Attends religious service: Not on file    Active member of club or organization: Not on file    Attends meetings of clubs or organizations: Not on file    Relationship status: Not on file  . Intimate partner violence:    Fear of current or ex partner: Not on file    Emotionally abused: Not on file    Physically abused: Not on file    Forced sexual activity: Not on file  Other Topics Concern  . Not on file  Social History Narrative  . Not on file    Outpatient Medications Prior to Visit  Medication Sig Dispense Refill  . acarbose (PRECOSE) 25 MG tablet Take 1 tablet (25 mg total) by mouth 3 (three) times daily with meals. 90 tablet 5  . albuterol (PROVENTIL HFA;VENTOLIN HFA) 108 (90 BASE) MCG/ACT inhaler Inhale 2 puffs into the lungs every 6 (six) hours as needed for wheezing. 18 g 1  . albuterol (PROVENTIL) (2.5 MG/3ML) 0.083% nebulizer solution Take 3 mLs (2.5  mg total) by nebulization every 4 (four) hours as needed for wheezing or shortness of breath. 75 mL 1  . calcium carbonate (OS-CAL) 600 MG TABS Take 600 mg by mouth daily.    . cetirizine (ZYRTEC ALLERGY) 10 MG tablet Take 10 mg by mouth daily.    . chlorthalidone (HYGROTON) 25 MG tablet Take 1 tablet (25 mg total) by mouth daily. 90 tablet 1  . Cyanocobalamin (B12 FAST DISSOLVE PO) Take by mouth.    . Pediatric Multiple Vitamins (FLINTSTONES MULTIVITAMIN PO) Take 2 tablets by mouth.    . ursodiol (ACTIGALL) 300 MG capsule Take 300 mg by mouth 2 (two) times daily.    [name] oseltamivir (TAMIFLU) 75 MG capsule Take 1 capsule (75 mg total) by mouth daily. 10 capsule 0  . omeprazole (PRILOSEC) 20 MG capsule Take 20 mg by mouth daily.     No facility-administered medications prior to visit.     Allergies  Allergen Reactions  . Corticosteroids Anaphylaxis  . Eggs Or Egg-Derived Products   . Latex   . Metformin And Related Nausea And Vomiting  . Penicillins     REACTION: SWELLING  . Sulfa Antibiotics Itching    Review of Systems  Constitutional: Negative for chills, fever and malaise/fatigue.  HENT: Negative for congestion and hearing loss.   Eyes: Negative for discharge.  Respiratory: Negative for cough, sputum production and shortness of breath.   Cardiovascular: Negative for chest pain, palpitations and leg swelling.  Gastrointestinal: Negative for abdominal pain, blood in stool, constipation, diarrhea, heartburn, nausea and vomiting.  Genitourinary: Negative for dysuria, frequency, hematuria and urgency.  Musculoskeletal: Negative for back pain, falls and myalgias.  Skin: Negative for rash.  Neurological: Negative for dizziness, sensory change, loss of consciousness, weakness and headaches.  Endo/Heme/Allergies: Negative for environmental allergies. Does not bruise/bleed easily.  Psychiatric/Behavioral: Negative for depression and suicidal ideas. The patient is not nervous/anxious and  does not have insomnia.        Objective:    Physical Exam  Constitutional: She is oriented to person, place, and time. No distress.  HENT:  Head: Normocephalic and atraumatic.  Right Ear: External ear normal.  Left Ear: External ear normal.  Nose: Nose normal.  Mouth/Throat: Oropharynx is clear and moist. No oropharyngeal exudate.  Eyes: Pupils are equal, round, and reactive to light. Conjunctivae  are normal. Right eye exhibits no discharge. Left eye exhibits no discharge. No scleral icterus.  Neck: Normal range of motion. Neck supple. No thyromegaly present.  Cardiovascular: Normal rate, regular rhythm, normal heart sounds and intact distal pulses.  No murmur heard. Pulmonary/Chest: Effort normal and breath sounds normal. No respiratory distress. She has no wheezes. She has no rales.  Abdominal: Soft. Bowel sounds are normal. She exhibits no distension and no mass. There is no tenderness.  Musculoskeletal: Normal range of motion. She exhibits no edema or tenderness.  Lymphadenopathy:    She has no cervical adenopathy.  Neurological: She is alert and oriented to person, place, and time. She has normal reflexes. She displays normal reflexes. No cranial nerve deficit. Coordination normal.  Skin: Skin is warm and dry. No rash noted. She is not diaphoretic.    BP 118/62 (BP Location: Left Arm, Patient Position: Sitting, Cuff Size: Normal)   Pulse 69   Temp 97.7 F (36.5 C) (Oral)   Resp 18   Wt 164 lb 12.8 oz (74.8 kg)   SpO2 96%   BMI 33.29 kg/m  Wt Readings from Last 3 Encounters:  01/21/18 164 lb 12.8 oz (74.8 kg)  09/10/17 160 lb (72.6 kg)  08/19/16 176 lb 6.4 oz (80 kg)   BP Readings from Last 3 Encounters:  01/21/18 118/62  12/04/17 110/75  09/10/17 132/82     Immunization History  Administered Date(s) Administered  . PPD Test 08/04/2012  . Td 09/09/2003  . Tetanus 09/08/2001    Health Maintenance  Topic Date Due  . HIV Screening  02/21/1991  . TETANUS/TDAP   09/08/2013  . PAP SMEAR  05/16/2016  . INFLUENZA VACCINE  05/13/2018 (Originally 04/08/2018)    Lab Results  Component Value Date   WBC 6.0 01/21/2018   HGB 12.5 01/21/2018   HCT 37.4 01/21/2018   PLT 309.0 01/21/2018   GLUCOSE 113 (H) 10/22/2015   CHOL 250 (H) 10/22/2015   TRIG 258.0 (H) 10/22/2015   HDL 58.10 10/22/2015   LDLDIRECT 161.0 10/22/2015   ALT 21 10/22/2015   AST 18 10/22/2015   NA 138 10/22/2015   K 3.9 10/22/2015   CL 102 10/22/2015   CREATININE 0.76 10/22/2015   BUN 16 10/22/2015   CO2 28 10/22/2015   TSH 1.57 01/21/2018   HGBA1C 5.7 01/21/2018    Lab Results  Component Value Date   TSH 1.57 01/21/2018   Lab Results  Component Value Date   WBC 6.0 01/21/2018   HGB 12.5 01/21/2018   HCT 37.4 01/21/2018   MCV 82.7 01/21/2018   PLT 309.0 01/21/2018   Lab Results  Component Value Date   NA 138 10/22/2015   K 3.9 10/22/2015   CO2 28 10/22/2015   GLUCOSE 113 (H) 10/22/2015   BUN 16 10/22/2015   CREATININE 0.76 10/22/2015   BILITOT 0.3 10/22/2015   ALKPHOS 58 10/22/2015   AST 18 10/22/2015   ALT 21 10/22/2015   PROT 7.4 10/22/2015   ALBUMIN 4.5 10/22/2015   CALCIUM 9.3 10/22/2015   GFR 89.74 10/22/2015   Lab Results  Component Value Date   CHOL 250 (H) 10/22/2015   Lab Results  Component Value Date   HDL 58.10 10/22/2015   No results found for: [facility] Lab Results  Component Value Date   TRIG 258.0 (H) 10/22/2015   Lab Results  Component Value Date   CHOLHDL 4 10/22/2015   Lab Results  Component Value Date   HGBA1C 5.7 01/21/2018  Assessment & Plan:   Problem List Items Addressed This Visit    BRADYCARDIA-TACHYCARDIA SYNDROME    RRR today      Relevant Medications   rosuvastatin (CRESTOR) 5 MG tablet   Other Relevant Orders   TSH (Completed)   T4, free (Completed)   Preventative health care    Patient encouraged to maintain heart healthy diet, regular exercise, adequate sleep. Consider daily probiotics. Take  medications as prescribed. Labs ordered today      Relevant Orders   CBC (Completed)   Hyperlipidemia, mixed    Encouraged heart healthy diet, increase exercise, avoid trans fats, consider a krill oil cap daily      Relevant Medications   rosuvastatin (CRESTOR) 5 MG tablet   Other Relevant Orders   TSH (Completed)   T4, free (Completed)   Comprehensive metabolic panel   Lipid panel   Hyperglycemia    hgba1c acceptable, minimize simple carbs. Increase exercise as tolerated.       Relevant Orders   Hemoglobin A1c (Completed)   TSH (Completed)   T4, free (Completed)   Comprehensive metabolic panel   Obesity    Encouraged DASH diet, decrease po intake and increase exercise as tolerated. Needs 7-8 hours of sleep nightly. Avoid trans fats, eat small, frequent meals every 4-5 hours with lean proteins, complex carbs and healthy fats. Minimize simple carbs         I have discontinued Veronica Stokes. Veronica "Liz"'s oseltamivir. I am also having her start on rosuvastatin. Additionally, I am having her maintain her calcium carbonate, Pediatric Multiple Vitamins (FLINTSTONES MULTIVITAMIN PO), albuterol, albuterol, cetirizine, acarbose, omeprazole, Cyanocobalamin (B12 FAST DISSOLVE PO), ursodiol, and chlorthalidone.  Meds ordered this encounter  Medications  . rosuvastatin (CRESTOR) 5 MG tablet    Sig: Take 1 tablet (5 mg total) by mouth every other day.    Dispense:  45 tablet    Refill:  1    CMA served as scribe during this visit. History, Physical and Plan performed by medical provider. Documentation and orders reviewed and attested to.  Danise Edge, MD

## 2018-01-21 NOTE — Assessment & Plan Note (Signed)
Encouraged DASH diet, decrease po intake and increase exercise as tolerated. Needs 7-8 hours of sleep nightly. Avoid trans fats, eat small, frequent meals every 4-5 hours with lean proteins, complex carbs and healthy fats. Minimize simple carbs 

## 2018-01-21 NOTE — Assessment & Plan Note (Signed)
hgba1c acceptable, minimize simple carbs. Increase exercise as tolerated.  

## 2018-01-21 NOTE — Assessment & Plan Note (Signed)
Patient encouraged to maintain heart healthy diet, regular exercise, adequate sleep. Consider daily probiotics. Take medications as prescribed. Labs ordered today 

## 2018-01-21 NOTE — Assessment & Plan Note (Signed)
Encouraged heart healthy diet, increase exercise, avoid trans fats, consider a krill oil cap daily 

## 2018-01-21 NOTE — Assessment & Plan Note (Signed)
RRR today 

## 2018-04-16 ENCOUNTER — Other Ambulatory Visit: Payer: BC Managed Care – PPO

## 2018-04-23 ENCOUNTER — Ambulatory Visit: Payer: BC Managed Care – PPO | Admitting: Family Medicine

## 2018-04-23 ENCOUNTER — Encounter: Payer: Self-pay | Admitting: Family Medicine

## 2018-04-23 VITALS — BP 110/62 | HR 66 | Temp 98.1°F | Resp 18 | Ht 59.0 in | Wt 166.2 lb

## 2018-04-23 DIAGNOSIS — I159 Secondary hypertension, unspecified: Secondary | ICD-10-CM | POA: Diagnosis not present

## 2018-04-23 DIAGNOSIS — M67441 Ganglion, right hand: Secondary | ICD-10-CM

## 2018-04-23 DIAGNOSIS — R739 Hyperglycemia, unspecified: Secondary | ICD-10-CM | POA: Diagnosis not present

## 2018-04-23 DIAGNOSIS — E782 Mixed hyperlipidemia: Secondary | ICD-10-CM

## 2018-04-23 MED ORDER — CHLORTHALIDONE 25 MG PO TABS: 25.0000 mg | ORAL_TABLET | Freq: Every day | ORAL | 1 refills | Status: DC

## 2018-04-23 MED ORDER — CHLORTHALIDONE 25 MG PO TABS
25.0000 mg | ORAL_TABLET | Freq: Every day | ORAL | 1 refills | Status: DC
Start: 2018-04-23 — End: 2019-02-24

## 2018-04-23 NOTE — Progress Notes (Signed)
error 

## 2018-04-23 NOTE — Assessment & Plan Note (Signed)
Painful lesion on third finger of right hand is referred to hand surgeon for futher consideration.

## 2018-04-23 NOTE — Assessment & Plan Note (Signed)
Encouraged heart healthy diet, increase exercise, avoid trans fats, consider a krill oil cap daily 

## 2018-04-23 NOTE — Assessment & Plan Note (Signed)
Well controlled, no changes to meds. Encouraged heart healthy diet such as the DASH diet and exercise as tolerated.  °

## 2018-04-23 NOTE — Assessment & Plan Note (Signed)
hgba1c acceptable, minimize simple carbs. Increase exercise as tolerated.  

## 2018-04-23 NOTE — Progress Notes (Signed)
Subjective:    Patient ID: Veronica Stokes, female    DOB: [birth year], 42 y.o.   MRN: [id number]  No chief complaint on file.   HPI Patient is in today for follow up. She feels well today because she quit one of her jobs yesterday. She is stressed about problems with her kids and money but happy with her choice. No recent febrile illness or hospitalizations. She has a painful lesion on a finger of her right hand. No injury, redness or warmth. Denies CP/palp/SOB/HA/congestion/fevers/GI or GU c/o. Taking meds as prescribed  Past Medical History:  Diagnosis Date  . Allergic state 12/18/2008   Qualifier: Diagnosis of  By: [name], [name]    . Asthma    cough variant  . Chicken pox as a child  . Hyperglycemia 10/28/2015  . Hyperlipidemia, mixed 10/28/2015  . Insomnia 09/09/2016  . Obesity 09/09/2016  . Polycystic ovarian syndrome 8-12  . Preventative health care 10/28/2015    Past Surgical History:  Procedure Laterality Date  . KNEE SURGERY  12-94, 10-07   X 2 left  . WISDOM TOOTH EXTRACTION  2001    Family History  Problem Relation Age of Onset  . Diabetes Mother        type 2  . Hypertension Mother   . Diabetes Father        type 2- off of everything since gastric bypass  . Mental illness Sister        bipolar, ADHD  . Mental illness Maternal Grandmother        manic and bipolar  . Stroke Maternal Grandmother   . Alcohol abuse Maternal Grandmother   . Hearing loss Maternal Grandmother   . Heart disease Maternal Grandfather   . Heart attack Maternal Grandfather   . Diabetes Paternal Grandmother        type 2  . Stroke Paternal Grandmother   . Thyroid disease Paternal Grandmother   . Stroke Paternal Grandfather   . Heart attack Paternal Grandfather   . Cancer Maternal Aunt 50       breast  . Cancer Maternal Aunt [id number]       breast     Social History   Socioeconomic History  . Marital status: Married    Spouse name: Not on file  . Number of children: Not on  file  . Years of education: Not on file  . Highest education level: Not on file  Occupational History  . Not on file  Social Needs  . Financial resource strain: Not on file  . Food insecurity:    Worry: Not on file    Inability: Not on file  . Transportation needs:    Medical: Not on file    Non-medical: Not on file  Tobacco Use  . Smoking status: Never Smoker  . Smokeless tobacco: Never Used  Substance and Sexual Activity  . Alcohol use: No  . Drug use: No  . 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 Physical activity:    Days per week: Not on file    Minutes per session: Not on file  . Stress: Not on file  Relationships  . Social connections:    Talks on phone: Not on file    Gets together: Not on file    Attends religious service: Not on file    Active member of club  or organization: Not on file    Attends meetings of clubs or organizations: Not on file    Relationship status: Not on file  . Intimate partner violence:    Fear of current or ex partner: Not on file    Emotionally abused: Not on file    Physically abused: Not on file    Forced sexual activity: Not on file  Other Topics Concern  . Not on file  Social History Narrative  . Not on file    Outpatient Medications Prior to Visit  Medication Sig Dispense Refill  . albuterol (PROVENTIL HFA;VENTOLIN HFA) 108 (90 BASE) MCG/ACT inhaler Inhale 2 puffs into the lungs every 6 (six) hours as needed for wheezing. 18 g 1  . albuterol (PROVENTIL) (2.5 MG/3ML) 0.083% nebulizer solution Take 3 mLs (2.5 mg total) by nebulization every 4 (four) hours as needed for wheezing or shortness of breath. 75 mL 1  . calcium carbonate (OS-CAL) 600 MG TABS Take 600 mg by mouth daily.    . cetirizine (ZYRTEC ALLERGY) 10 MG tablet Take 10 mg by mouth daily.    . Cyanocobalamin (B12 FAST DISSOLVE PO) Take by mouth.    . Pediatric Multiple  Vitamins (FLINTSTONES MULTIVITAMIN PO) Take 2 tablets by mouth.    . rosuvastatin (CRESTOR) 5 MG tablet Take 1 tablet (5 mg total) by mouth every other day. 45 tablet 1  . omeprazole (PRILOSEC) 20 MG capsule Take 20 mg by mouth daily.    [name] acarbose (PRECOSE) 25 MG tablet Take 1 tablet (25 mg total) by mouth 3 (three) times daily with meals. 90 tablet 5  . chlorthalidone (HYGROTON) 25 MG tablet Take 1 tablet (25 mg total) by mouth daily. 90 tablet 1  . ursodiol (ACTIGALL) 300 MG capsule Take 300 mg by mouth 2 (two) times daily.     No facility-administered medications prior to visit.     Allergies  Allergen Reactions  . Corticosteroids Anaphylaxis  . Eggs Or Egg-Derived Products   . Latex   . Metformin And Related Nausea And Vomiting  . Penicillins     REACTION: SWELLING  . Sulfa Antibiotics Itching    Review of Systems  Constitutional: Negative for fever and malaise/fatigue.  HENT: Negative for congestion.   Eyes: Negative for blurred vision.  Respiratory: Negative for shortness of breath.   Cardiovascular: Negative for chest pain, palpitations and leg swelling.  Gastrointestinal: Negative for abdominal pain, blood in stool and nausea.  Genitourinary: Negative for dysuria and frequency.  Musculoskeletal: Positive for joint pain. Negative for falls.  Skin: Negative for rash.  Neurological: Negative for dizziness, loss of consciousness and headaches.  Endo/Heme/Allergies: Negative for environmental allergies.  Psychiatric/Behavioral: Negative for depression. The patient is not nervous/anxious.        Objective:    Physical Exam  Constitutional: She is oriented to person, place, and time. She appears well-developed and well-nourished. No distress.  HENT:  Head: Normocephalic and atraumatic.  Nose: Nose normal.  Eyes: Right eye exhibits no discharge. Left eye exhibits no discharge.  Neck: Normal range of motion. Neck supple.  Cardiovascular: Normal rate and regular rhythm.    No murmur heard. Pulmonary/Chest: Effort normal and breath sounds normal.  Abdominal: Soft. Bowel sounds are normal. There is no tenderness.  Musculoskeletal: She exhibits no edema.  Cyst on 3rd finger one on dorsal surface and one on palmar sugace.   Neurological: She is alert and oriented to person, place, and time.  Skin: Skin is warm and  dry.  Psychiatric: She has a normal mood and affect.  Nursing note and vitals reviewed.   BP 110/62 (BP Location: Left Arm, Patient Position: Sitting, Cuff Size: Normal)   Pulse 66   Temp 98.1 F (36.7 C) (Oral)   Resp 18   Ht 4\' 11"  (1.499 m)   Wt 166 lb 3.2 oz (75.4 kg)   SpO2 98%   BMI 33.57 kg/m  Wt Readings from Last 3 Encounters:  04/23/18 166 lb 3.2 oz (75.4 kg)  01/21/18 164 lb 12.8 oz (74.8 kg)  09/10/17 160 lb (72.6 kg)     Lab Results  Component Value Date   WBC 6.0 01/21/2018   HGB 12.5 01/21/2018   HCT 37.4 01/21/2018   PLT 309.0 01/21/2018   GLUCOSE 113 (H) 10/22/2015   CHOL 250 (H) 10/22/2015   TRIG 258.0 (H) 10/22/2015   HDL 58.10 10/22/2015   LDLDIRECT 161.0 10/22/2015   ALT 21 10/22/2015   AST 18 10/22/2015   NA 138 10/22/2015   K 3.9 10/22/2015   CL 102 10/22/2015   CREATININE 0.76 10/22/2015   BUN 16 10/22/2015   CO2 28 10/22/2015   TSH 1.57 01/21/2018   HGBA1C 5.7 01/21/2018    Lab Results  Component Value Date   TSH 1.57 01/21/2018   Lab Results  Component Value Date   WBC 6.0 01/21/2018   HGB 12.5 01/21/2018   HCT 37.4 01/21/2018   MCV 82.7 01/21/2018   PLT 309.0 01/21/2018   Lab Results  Component Value Date   NA 138 10/22/2015   K 3.9 10/22/2015   CO2 28 10/22/2015   GLUCOSE 113 (H) 10/22/2015   BUN 16 10/22/2015   CREATININE 0.76 10/22/2015   BILITOT 0.3 10/22/2015   ALKPHOS 58 10/22/2015   AST 18 10/22/2015   ALT 21 10/22/2015   PROT 7.4 10/22/2015   ALBUMIN 4.5 10/22/2015   CALCIUM 9.3 10/22/2015   GFR 89.74 10/22/2015   Lab Results  Component Value Date   CHOL 250 (H)  10/22/2015   Lab Results  Component Value Date   HDL 58.10 10/22/2015   No results found for: [facility] Lab Results  Component Value Date   TRIG 258.0 (H) 10/22/2015   Lab Results  Component Value Date   CHOLHDL 4 10/22/2015   Lab Results  Component Value Date   HGBA1C 5.7 01/21/2018       Assessment & Plan:   Problem List Items Addressed This Visit    Hyperlipidemia, mixed    Encouraged heart healthy diet, increase exercise, avoid trans fats, consider a krill oil cap daily      Relevant Medications   chlorthalidone (HYGROTON) 25 MG tablet   Other Relevant Orders   Lipid panel   Hyperglycemia    hgba1c acceptable, minimize simple carbs. Increase exercise as tolerated.       Relevant Orders   Hemoglobin A1c   Secondary hypertension    Well controlled, no changes to meds. Encouraged heart healthy diet such as the DASH diet and exercise as tolerated.       Relevant Medications   chlorthalidone (HYGROTON) 25 MG tablet   Other Relevant Orders   Comprehensive metabolic panel   CBC   TSH   Ganglion cyst of finger of right hand - Primary    Painful lesion on third finger of right hand is referred to hand surgeon for futher consideration.       Relevant Orders   Ambulatory referral to Hand Surgery  I have discontinued Veronica HearingElizabeth H. Kilbride "Liz"'s acarbose and ursodiol. I am also having her maintain her calcium carbonate, Pediatric Multiple Vitamins (FLINTSTONES MULTIVITAMIN PO), albuterol, albuterol, cetirizine, omeprazole, Cyanocobalamin (B12 FAST DISSOLVE PO), rosuvastatin, and chlorthalidone.  Meds ordered this encounter  Medications  . DISCONTD: chlorthalidone (HYGROTON) 25 MG tablet    Sig: Take 1 tablet (25 mg total) by mouth daily.    Dispense:  90 tablet    Refill:  1  . chlorthalidone (HYGROTON) 25 MG tablet    Sig: Take 1 tablet (25 mg total) by mouth daily.    Dispense:  90 tablet    Refill:  1     Danise EdgeStacey Undra Trembath, MD

## 2018-04-23 NOTE — Patient Instructions (Signed)

## 2018-10-14 ENCOUNTER — Ambulatory Visit (INDEPENDENT_AMBULATORY_CARE_PROVIDER_SITE_OTHER): Payer: BLUE CROSS/BLUE SHIELD | Admitting: Family Medicine

## 2018-10-14 VITALS — BP 122/78 | HR 88 | Temp 99.2°F | Resp 16 | Wt 169.4 lb

## 2018-10-14 DIAGNOSIS — I159 Secondary hypertension, unspecified: Secondary | ICD-10-CM | POA: Diagnosis not present

## 2018-10-14 DIAGNOSIS — N63 Unspecified lump in unspecified breast: Secondary | ICD-10-CM | POA: Diagnosis not present

## 2018-10-14 NOTE — Progress Notes (Signed)
Subjective:    Patient ID: Veronica HearingElizabeth H Hajduk, female    DOB: 10/18/1975, 43 y.o.   MRN: [id number]  Chief Complaint  Patient presents with  . Mass    Pt report lump on lest breast     HPI Patient is in today for follow up. No recent febrile illness or hospitalizaton. She is noting a lump in her left breast which is tender but not warm, red or hot. No discharge. She does monthly breast exam and just now noticed the lesion. Denies CP/palp/SOB/HA/congestion/fevers/GI or GU c/o. Taking meds as prescribed   Past Medical History:  Diagnosis Date  . Allergic state 12/18/2008   Qualifier: Diagnosis of  By: [name], [name]    . Asthma    cough variant  . Chicken pox as a child  . Hyperglycemia 10/28/2015  . Hyperlipidemia, mixed 10/28/2015  . Insomnia 09/09/2016  . Obesity 09/09/2016  . Polycystic ovarian syndrome 8-12  . Preventative health care 10/28/2015    Past Surgical History:  Procedure Laterality Date  . KNEE SURGERY  12-94, 10-07   X 2 left  . WISDOM TOOTH EXTRACTION  2001    Family History  Problem Relation Age of Onset  . Diabetes Mother        type 2  . Hypertension Mother   . Diabetes Father        type 2- off of everything since gastric bypass  . Mental illness Sister        bipolar, ADHD  . Mental illness Maternal Grandmother        manic and bipolar  . Stroke Maternal Grandmother   . Alcohol abuse Maternal Grandmother   . Stokes loss Maternal Grandmother   . Heart disease Maternal Grandfather   . Heart attack Maternal Grandfather   . Diabetes Paternal Grandmother        type 2  . Stroke Paternal Grandmother   . Thyroid disease Paternal Grandmother   . Stroke Paternal Grandfather   . Heart attack Paternal Grandfather   . Cancer Maternal Aunt 50       breast  . Cancer Maternal Aunt [id number]       breast     Social History   Socioeconomic History  . Marital status: Married    Spouse name: Not on file  . Number of children: Not on file  . Years of  education: Not on file  . Highest education level: Not on file  Occupational History  . Not on file  Social Needs  . Financial resource strain: Not on file  . Food insecurity:    Worry: Not on file    Inability: Not on file  . Transportation needs:    Medical: Not on file    Non-medical: Not on file  Tobacco Use  . Smoking status: Never Smoker  . Smokeless tobacco: Never Used  Substance and Sexual Activity  . Alcohol use: No  . Drug use: No  . 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 Physical activity:    Days per week: Not on file    Minutes per session: Not on file  . Stress: Not on file  Relationships  . Social connections:    Talks on phone: Not on file    Gets together: Not on file    Attends religious service: Not on file    Active  member of club or organization: Not on file    Attends meetings of clubs or organizations: Not on file    Relationship status: Not on file  . Intimate partner violence:    Fear of current or ex partner: Not on file    Emotionally abused: Not on file    Physically abused: Not on file    Forced sexual activity: Not on file  Other Topics Concern  . Not on file  Social History Narrative  . Not on file    Outpatient Medications Prior to Visit  Medication Sig Dispense Refill  . albuterol (PROVENTIL HFA;VENTOLIN HFA) 108 (90 BASE) MCG/ACT inhaler Inhale 2 puffs into the lungs every 6 (six) hours as needed for wheezing. 18 g 1  . albuterol (PROVENTIL) (2.5 MG/3ML) 0.083% nebulizer solution Take 3 mLs (2.5 mg total) by nebulization every 4 (four) hours as needed for wheezing or shortness of breath. 75 mL 1  . calcium carbonate (OS-CAL) 600 MG TABS Take 600 mg by mouth daily.    . cetirizine (ZYRTEC ALLERGY) 10 MG tablet Take 10 mg by mouth daily.    . chlorthalidone (HYGROTON) 25 MG tablet Take 1 tablet (25 mg total) by mouth daily. 90 tablet 1  .  Cyanocobalamin (B12 FAST DISSOLVE PO) Take by mouth.    . Pediatric Multiple Vitamins (FLINTSTONES MULTIVITAMIN PO) Take 2 tablets by mouth.    . QSYMIA 7.5-46 MG CP24 TK 1 C PO QD    . rosuvastatin (CRESTOR) 5 MG tablet Take 1 tablet (5 mg total) by mouth every other day. 45 tablet 1  . omeprazole (PRILOSEC) 20 MG capsule Take 20 mg by mouth daily.     No facility-administered medications prior to visit.     Allergies  Allergen Reactions  . Corticosteroids Anaphylaxis  . Eggs Or Egg-Derived Products   . Latex   . Metformin And Related Nausea And Vomiting  . Penicillins     REACTION: SWELLING  . Sulfa Antibiotics Itching    Review of Systems  Constitutional: Negative for fever and malaise/fatigue.  HENT: Negative for congestion.   Eyes: Negative for blurred vision.  Respiratory: Negative for shortness of breath.   Cardiovascular: Negative for chest pain, palpitations and leg swelling.  Gastrointestinal: Negative for abdominal pain, blood in stool and nausea.  Genitourinary: Negative for dysuria and frequency.  Musculoskeletal: Negative for falls.  Skin: Negative for rash.  Neurological: Negative for dizziness, loss of consciousness and headaches.  Endo/Heme/Allergies: Negative for environmental allergies.  Psychiatric/Behavioral: Negative for depression. The patient is not nervous/anxious.        Objective:    Physical Exam Vitals signs and nursing note reviewed.  Constitutional:      General: She is not in acute distress.    Appearance: She is well-developed.  HENT:     Head: Normocephalic and atraumatic.     Nose: Nose normal.  Eyes:     General:        Right eye: No discharge.        Left eye: No discharge.  Neck:     Musculoskeletal: Normal range of motion and neck supple.  Cardiovascular:     Rate and Rhythm: Normal rate and regular rhythm.     Heart sounds: No murmur.  Pulmonary:     Effort: Pulmonary effort is normal.     Breath sounds: Normal breath  sounds.  Abdominal:     General: Bowel sounds are normal.     Palpations: Abdomen is  soft.     Tenderness: There is no abdominal tenderness.  Genitourinary:    Comments: Right breast without obvious lesion. Left breast has a 1.5 cm firm, round lesion at 7 oclock of left breast, mildly tender. no skin changes or discharge noted.  Skin:    General: Skin is warm and dry.  Neurological:     Mental Status: She is alert and oriented to person, place, and time.     BP 122/78   Pulse 88   Temp 99.2 F (37.3 C) (Oral)   Resp 16   Wt 169 lb 6.4 oz (76.8 kg)   SpO2 99%   BMI 34.21 kg/m  Wt Readings from Last 3 Encounters:  10/14/18 169 lb 6.4 oz (76.8 kg)  04/23/18 166 lb 3.2 oz (75.4 kg)  01/21/18 164 lb 12.8 oz (74.8 kg)     Lab Results  Component Value Date   WBC 6.0 01/21/2018   HGB 12.5 01/21/2018   HCT 37.4 01/21/2018   PLT 309.0 01/21/2018   GLUCOSE 113 (H) 10/22/2015   CHOL 250 (H) 10/22/2015   TRIG 258.0 (H) 10/22/2015   HDL 58.10 10/22/2015   LDLDIRECT 161.0 10/22/2015   ALT 21 10/22/2015   AST 18 10/22/2015   NA 138 10/22/2015   K 3.9 10/22/2015   CL 102 10/22/2015   CREATININE 0.76 10/22/2015   BUN 16 10/22/2015   CO2 28 10/22/2015   TSH 1.57 01/21/2018   HGBA1C 5.7 01/21/2018    Lab Results  Component Value Date   TSH 1.57 01/21/2018   Lab Results  Component Value Date   WBC 6.0 01/21/2018   HGB 12.5 01/21/2018   HCT 37.4 01/21/2018   MCV 82.7 01/21/2018   PLT 309.0 01/21/2018   Lab Results  Component Value Date   NA 138 10/22/2015   K 3.9 10/22/2015   CO2 28 10/22/2015   GLUCOSE 113 (H) 10/22/2015   BUN 16 10/22/2015   CREATININE 0.76 10/22/2015   BILITOT 0.3 10/22/2015   ALKPHOS 58 10/22/2015   AST 18 10/22/2015   ALT 21 10/22/2015   PROT 7.4 10/22/2015   ALBUMIN 4.5 10/22/2015   CALCIUM 9.3 10/22/2015   GFR 89.74 10/22/2015   Lab Results  Component Value Date   CHOL 250 (H) 10/22/2015   Lab Results  Component Value Date    HDL 58.10 10/22/2015   No results found for: [facility] Lab Results  Component Value Date   TRIG 258.0 (H) 10/22/2015   Lab Results  Component Value Date   CHOLHDL 4 10/22/2015   Lab Results  Component Value Date   HGBA1C 5.7 01/21/2018       Assessment & Plan:   Problem List Items Addressed This Visit    Secondary hypertension    Well controlled, no changes to meds. Encouraged heart healthy diet such as the DASH diet and exercise as tolerated.       Breast lump in lower inner quadrant - Primary    Does monthly self breast exam and just discovered a lump at about 7 oclock in her left breast. Ordered diagnostic MGM and Ultrasound       Relevant Orders   MM Digital Diagnostic Bilat   US BREAST COMPLETE UNI LEFT INC AXILLA      I am having Ruben Gottron. Meyers "[name]" maintain her calcium carbonate, Pediatric Multiple Vitamins (FLINTSTONES MULTIVITAMIN PO), albuterol, albuterol, cetirizine, omeprazole, Cyanocobalamin (B12 FAST DISSOLVE PO), rosuvastatin, chlorthalidone, and QSYMIA.  No orders of the defined  types were placed in this encounter.    Penni Homans, MD

## 2018-10-14 NOTE — Assessment & Plan Note (Signed)
Well controlled, no changes to meds. Encouraged heart healthy diet such as the DASH diet and exercise as tolerated.  °

## 2018-10-14 NOTE — Assessment & Plan Note (Signed)
Does monthly self breast exam and just discovered a lump at about 7 oclock in her left breast. Ordered diagnostic MGM and Ultrasound

## 2018-10-14 NOTE — Patient Instructions (Signed)
Fibroadenoma  Fibroadenoma is a breast tumor that is not cancerous (is benign). These tumors are made up of breast tissue and the tissue that holds breast tissue together (connective tissue). There are several types:  · Simple fibroadenoma. This is the most common type. It contains only one type of tissue.  · Complex fibroadenoma. This type contains more than one kind of tissue or irregular tissue, such as pockets of fluid (cysts) or deposits of calcium (calcifications) in the breast.  · Juvenile fibroadenoma. This is a type of tumor that can develop in adolescent girls. It tends to grow larger over time than other adenomas.  Although fibroadenomas are not cancerous, having a fibroadenoma may slightly increase your risk for developing breast cancer in the future.  What are the causes?  The cause of fibroadenoma is not known.  What increases the risk?  This condition is more likely to develop in:  · Women who are 20-30 years old.  · Women of African American descent.  What are the signs or symptoms?  You might have no symptoms. Some fibroadenomas are too small to feel. If you can feel it, it may feel like a lump in your breast that is:  · Firm.  · Round.  · Smooth.  · Slightly movable.  A fibroadenoma usually occurs as a single lump, but sometimes there may be more than one lump. They can occur in one breast or in both breasts. Fibroadenomas vary in size. They usually do not cause pain unless they grow to a large size.  How is this diagnosed?  This condition may be diagnosed based on:  · Your symptoms and medical history.  · A physical exam. You may notice a lump during a breast self-exam, or your health care provider may notice it during a routine breast exam or breast X-ray (mammogram).  · An ultrasound to check for fluid inside the lump (cystic tumor). If there is fluid, some fluid may be removed with a needle and examined under a microscope.  · A mammogram to examine a lump that does not contain fluid (solid).  Depending on mammogram results, you may need to have a procedure to remove a tissue sample from the lump using a needle (breast biopsy). The tissue will be examined under a microscope.  How is this treated?  Treatment for this condition may include:  · Having clinical breast exams regularly to check for changes in your fibroadenoma.  · Having the fibroadenoma removed. A fibroadenoma may be removed if it is:  ? Large.  ? Continuing to grow.  ? Causing symptoms, such as pain or a change in the appearance of your breast.  ? A juvenile fibroadenoma. These tend to grow large over time.  Follow these instructions at home:    · Do breast self-exams at home as told by your health care provider. Monitor your fibroadenoma, the skin of your breasts, and your nipples for any changes.  · Do not use any products that contain nicotine or tobacco, such as cigarettes and e-cigarettes. These can further increase your cancer risk. If you need help quitting, ask your health care provider.  · Keep all follow-up visits as told by your health care provider. This is important. You will need breast exams on a regular basis.  Contact a health care provider if:  · Your fibroadenoma:  ? Gets larger.  ? Feels different.  ? Becomes painful.  · You find a new breast lump.  · You have any   changes in your breast skin, such as:  ? Dimpling.  ? Bruising.  ? Thickening.  ? Redness.  · You have any changes in your nipple, such as:  ? Fluid leaking from a nipple.  ? Redness.  Summary  · Fibroadenoma is a breast tumor that is not cancerous (is benign) and is made up of breast tissue and the tissue that holds breast tissue together (connective tissue).  · Although fibroadenomas are not cancerous, having a fibroadenoma may slightly increase your risk for developing breast cancer in the future.  · A fibroadenoma may feel like a lump in your breast. It is usually firm, round, smooth, and slightly movable. Some fibroadenomas are too small to be felt.  · Do  breast self-exams at home as told by your health care provider. Monitor your fibroadenoma, the skin of your breasts, and your nipples for any changes.  This information is not intended to replace advice given to you by your health care provider. Make sure you discuss any questions you have with your health care provider.  Document Released: 01/09/2015 Document Revised: 08/25/2017 Document Reviewed: 08/25/2017  Elsevier Interactive Patient Education © 2019 [employer] Inc[employer]

## 2018-10-19 ENCOUNTER — Ambulatory Visit
Admission: RE | Admit: 2018-10-19 | Discharge: 2018-10-19 | Disposition: A | Discharge status: Home / Self Care | Payer: BLUE CROSS/BLUE SHIELD | Source: Ambulatory Visit | Attending: Family Medicine | Admitting: Family Medicine

## 2018-10-19 ENCOUNTER — Other Ambulatory Visit: Payer: Self-pay | Admitting: Family Medicine

## 2018-10-19 DIAGNOSIS — N63 Unspecified lump in unspecified breast: Secondary | ICD-10-CM

## 2018-10-21 ENCOUNTER — Other Ambulatory Visit: Payer: BLUE CROSS/BLUE SHIELD

## 2018-10-28 ENCOUNTER — Other Ambulatory Visit: Payer: Self-pay | Admitting: Family Medicine

## 2018-10-28 DIAGNOSIS — N6489 Other specified disorders of breast: Secondary | ICD-10-CM

## 2018-12-02 ENCOUNTER — Encounter: Payer: BLUE CROSS/BLUE SHIELD | Admitting: Family Medicine

## 2019-01-17 ENCOUNTER — Other Ambulatory Visit: Payer: BLUE CROSS/BLUE SHIELD

## 2019-01-18 ENCOUNTER — Other Ambulatory Visit: Payer: BLUE CROSS/BLUE SHIELD

## 2019-01-21 ENCOUNTER — Ambulatory Visit
Admission: RE | Admit: 2019-01-21 | Discharge: 2019-01-21 | Disposition: A | Discharge status: Home / Self Care | Payer: BLUE CROSS/BLUE SHIELD | Source: Ambulatory Visit | Attending: Family Medicine | Admitting: Family Medicine

## 2019-01-21 ENCOUNTER — Other Ambulatory Visit: Payer: Self-pay

## 2019-01-21 ENCOUNTER — Ambulatory Visit: Payer: BLUE CROSS/BLUE SHIELD

## 2019-01-21 DIAGNOSIS — N6489 Other specified disorders of breast: Secondary | ICD-10-CM

## 2019-01-27 ENCOUNTER — Encounter: Payer: BLUE CROSS/BLUE SHIELD | Admitting: Family Medicine

## 2019-02-24 ENCOUNTER — Telehealth: Payer: Self-pay | Admitting: Family Medicine

## 2019-02-24 MED ORDER — CHLORTHALIDONE 25 MG PO TABS: 25.0000 mg | ORAL_TABLET | Freq: Every day | ORAL | 1 refills | Status: DC

## 2019-02-24 NOTE — Telephone Encounter (Signed)
Called pt to schedule VOV for medication refill but provider does not have any appt available this wk nor next wk and pt will be completely out of meds for Chlorthalidone (hygroton) 25 mg in one wk, pt would like to have refill ASAP. Please advise. (pt has cpe already schedule)

## 2019-02-24 NOTE — Telephone Encounter (Signed)
Sent medication in  Patient made aware

## 2019-05-05 ENCOUNTER — Encounter: Payer: BLUE CROSS/BLUE SHIELD | Admitting: Family Medicine

## 2019-05-05 ENCOUNTER — Telehealth: Payer: Self-pay

## 2019-05-05 NOTE — Telephone Encounter (Signed)
Copied from [place] [id number]. Topic: Quick Communication - Appointment Cancellation >> May 05, 2019  8:44 AM Scherrie Gerlach wrote: Patient called to cancel appointment scheduled for today, dr Charlett Blake.pt woke up to water leak this am, floor is soft, waiting on people to get there

## 2019-05-09 NOTE — Telephone Encounter (Signed)
Noted  

## 2019-06-08 ENCOUNTER — Other Ambulatory Visit: Payer: Self-pay

## 2019-06-09 ENCOUNTER — Encounter: Payer: Self-pay | Admitting: Family Medicine

## 2019-06-09 ENCOUNTER — Ambulatory Visit (INDEPENDENT_AMBULATORY_CARE_PROVIDER_SITE_OTHER): Payer: BC Managed Care – PPO | Admitting: Family Medicine

## 2019-06-09 ENCOUNTER — Other Ambulatory Visit: Payer: Self-pay

## 2019-06-09 VITALS — BP 142/78 | HR 91 | Temp 97.1°F | Resp 18 | Ht 59.0 in | Wt 180.2 lb

## 2019-06-09 DIAGNOSIS — J4521 Mild intermittent asthma with (acute) exacerbation: Secondary | ICD-10-CM

## 2019-06-09 DIAGNOSIS — I159 Secondary hypertension, unspecified: Secondary | ICD-10-CM | POA: Diagnosis not present

## 2019-06-09 DIAGNOSIS — J45991 Cough variant asthma: Secondary | ICD-10-CM

## 2019-06-09 DIAGNOSIS — Z0000 Encounter for general adult medical examination without abnormal findings: Secondary | ICD-10-CM | POA: Diagnosis not present

## 2019-06-09 DIAGNOSIS — R739 Hyperglycemia, unspecified: Secondary | ICD-10-CM | POA: Diagnosis not present

## 2019-06-09 DIAGNOSIS — E782 Mixed hyperlipidemia: Secondary | ICD-10-CM

## 2019-06-09 LAB — LIPID PANEL
Cholesterol: 235 mg/dL — ABNORMAL HIGH (ref 0–200)
HDL: 60.5 mg/dL (ref >39.00)
LDL Cholesterol: 146 mg/dL — ABNORMAL HIGH (ref 0–99)
NonHDL: 174.28
Total CHOL/HDL Ratio: 4
Triglycerides: 139 mg/dL (ref 0.0–149.0)
VLDL: 27.8 mg/dL (ref 0.0–40.0)

## 2019-06-09 LAB — COMPREHENSIVE METABOLIC PANEL
ALT: 16 U/L (ref 0–35)
AST: 15 U/L (ref 0–37)
Albumin: 4.3 g/dL (ref 3.5–5.2)
Alkaline Phosphatase: 53 U/L (ref 39–117)
BUN: 15 mg/dL (ref 6–23)
CO2: 30 mEq/L (ref 19–32)
Calcium: 9.8 mg/dL (ref 8.4–10.5)
Chloride: 97 mEq/L (ref 96–112)
Creatinine, Ser: 0.69 mg/dL (ref 0.40–1.20)
GFR: 92.73 mL/min (ref >60.00)
Glucose, Bld: 82 mg/dL (ref 70–99)
Potassium: 4 mEq/L (ref 3.5–5.1)
Sodium: 137 mEq/L (ref 135–145)
Total Bilirubin: 0.3 mg/dL (ref 0.2–1.2)
Total Protein: 6.9 g/dL (ref 6.0–8.3)

## 2019-06-09 LAB — HEMOGLOBIN A1C: Hgb A1c MFr Bld: 6.1 % (ref 4.6–6.5)

## 2019-06-09 LAB — CBC
HCT: 37.5 % (ref 36.0–46.0)
Hemoglobin: 12.2 g/dL (ref 12.0–15.0)
MCHC: 32.7 g/dL (ref 30.0–36.0)
MCV: 80.7 fl (ref 78.0–100.0)
Platelets: 349 10*3/uL (ref 150.0–400.0)
RBC: 4.64 Mil/uL (ref 3.87–5.11)
RDW: 13.6 % (ref 11.5–15.5)
WBC: 9.7 10*3/uL (ref 4.0–10.5)

## 2019-06-09 LAB — TSH: TSH: 2.04 u[IU]/mL (ref 0.35–4.50)

## 2019-06-09 MED ORDER — OMEPRAZOLE 20 MG PO CPDR: 20.0000 mg | DELAYED_RELEASE_CAPSULE | Freq: Every day | ORAL | 11 refills | Status: DC

## 2019-06-09 MED ORDER — ALBUTEROL SULFATE HFA 108 (90 BASE) MCG/ACT IN AERS: 2.0000 | INHALATION_SPRAY | Freq: Four times a day (QID) | RESPIRATORY_TRACT | 1 refills | Status: AC | PRN

## 2019-06-09 NOTE — Patient Instructions (Signed)
Omron blood pressure cuff, upper arm Pulse oximeter  Multivitamin with minerals daily  Preventive Care 43-43 Years Old, Female Preventive care refers to visits with your health care provider and lifestyle choices that can promote health and wellness. This includes:  A yearly physical exam. This may also be called an annual well check.  Regular dental visits and eye exams.  Immunizations.  Screening for certain conditions.  Healthy lifestyle choices, such as eating a healthy diet, getting regular exercise, not using drugs or products that contain nicotine and tobacco, and limiting alcohol use. What can I expect for my preventive care visit? Physical exam Your health care provider will check your:  Height and weight. This may be used to calculate body mass index (BMI), which tells if you are at a healthy weight.  Heart rate and blood pressure.  Skin for abnormal spots. Counseling Your health care provider may ask you questions about your:  Alcohol, tobacco, and drug use.  Emotional well-being.  Home and relationship well-being.  Sexual activity.  Eating habits.  Work and work Statistician.  Method of birth control.  Menstrual cycle.  Pregnancy history. What immunizations do I need?  Influenza (flu) vaccine  This is recommended every year. Tetanus, diphtheria, and pertussis (Tdap) vaccine  You may need a Td booster every 10 years. Varicella (chickenpox) vaccine  You may need this if you have not been vaccinated. Human papillomavirus (HPV) vaccine  If recommended by your health care provider, you may need three doses over 6 months. Measles, mumps, and rubella (MMR) vaccine  You may need at least one dose of MMR. You may also need a second dose. Meningococcal conjugate (MenACWY) vaccine  One dose is recommended if you are age 41-21 years and a first-year college student living in a residence hall, or if you have one of several medical conditions. You may  also need additional booster doses. Pneumococcal conjugate (PCV13) vaccine  You may need this if you have certain conditions and were not previously vaccinated. Pneumococcal polysaccharide (PPSV23) vaccine  You may need one or two doses if you smoke cigarettes or if you have certain conditions. Hepatitis A vaccine  You may need this if you have certain conditions or if you travel or work in places where you may be exposed to hepatitis A. Hepatitis B vaccine  You may need this if you have certain conditions or if you travel or work in places where you may be exposed to hepatitis B. Haemophilus influenzae type b (Hib) vaccine  You may need this if you have certain conditions. You may receive vaccines as individual doses or as more than one vaccine together in one shot (combination vaccines). Talk with your health care provider about the risks and benefits of combination vaccines. What tests do I need?  Blood tests  Lipid and cholesterol levels. These may be checked every 5 years starting at age 43.  Hepatitis C test.  Hepatitis B test. Screening  Diabetes screening. This is done by checking your blood sugar (glucose) after you have not eaten for a while (fasting).  Sexually transmitted disease (STD) testing.  BRCA-related cancer screening. This may be done if you have a family history of breast, ovarian, tubal, or peritoneal cancers.  Pelvic exam and Pap test. This may be done every 3 years starting at age 43. Starting at age 43, this may be done every 5 years if you have a Pap test in combination with an HPV test. Talk with your health care provider  about your test results, treatment options, and if necessary, the need for more tests. Follow these instructions at home: Eating and drinking   Eat a diet that includes fresh fruits and vegetables, whole grains, lean protein, and low-fat dairy.  Take vitamin and mineral supplements as recommended by your health care provider.   Do not drink alcohol if: ? Your health care provider tells you not to drink. ? You are pregnant, may be pregnant, or are planning to become pregnant.  If you drink alcohol: ? Limit how much you have to 0-1 drink a day. ? Be aware of how much alcohol is in your drink. In the [place] one drink equals one 12 oz bottle of beer (355 mL), one 5 oz glass of wine (148 mL), or one 1 oz glass of hard liquor (44 mL). Lifestyle  Take daily care of your teeth and gums.  Stay active. Exercise for at least 30 minutes on 5 or more days each week.  Do not use any products that contain nicotine or tobacco, such as cigarettes, e-cigarettes, and chewing tobacco. If you need help quitting, ask your health care provider.  If you are sexually active, practice safe sex. Use a condom or other form of birth control (contraception) in order to prevent pregnancy and STIs (sexually transmitted infections). If you plan to become pregnant, see your health care provider for a preconception visit. What's next?  Visit your health care provider once a year for a well check visit.  Ask your health care provider how often you should have your eyes and teeth checked.  Stay up to date on all vaccines. This information is not intended to replace advice given to you by your health care provider. Make sure you discuss any questions you have with your health care provider. Document Released: 10/21/2001 Document Revised: 05/06/2018 Document Reviewed: 05/06/2018 Elsevier Patient Education  2020 [employer].

## 2019-06-09 NOTE — Assessment & Plan Note (Signed)
hgba1c acceptable, minimize simple carbs. Increase exercise as tolerated.  

## 2019-06-09 NOTE — Assessment & Plan Note (Signed)
Well controlled, no changes to meds. Encouraged heart healthy diet such as the DASH diet and exercise as tolerated.  °

## 2019-06-09 NOTE — Assessment & Plan Note (Signed)
Encouraged heart healthy diet, increase exercise, avoid trans fats, consider a krill oil cap daily 

## 2019-06-09 NOTE — Assessment & Plan Note (Signed)
Patient encouraged to maintain heart healthy diet, regular exercise, adequate sleep. Consider daily probiotics. Take medications as prescribed 

## 2019-06-10 MED ORDER — ROSUVASTATIN CALCIUM 5 MG PO TABS: 5.0000 mg | ORAL_TABLET | Freq: Every day | ORAL | 3 refills | Status: DC

## 2019-06-12 NOTE — Assessment & Plan Note (Signed)
No recent exacerbation but given a refill on her Albuterol to keep around.

## 2019-06-12 NOTE — Progress Notes (Signed)
Subjective:    Patient ID: Veronica Stokes, female    DOB: Sep 28, 1975, 43 y.o.   MRN: [id number]  Chief Complaint  Patient presents with  . Annual Exam    HPI Patient is in today for annual preventatie exam and follow up on chronic medical concerns including hyperglycemia, hyperlipidemia, asthma and more. She is doing well. No recent febrile illness or hospitalizations. No polyuria or polydipsia. Is trying to eat well and stay active but it has been more of a struggle with the pandemic, is masking regularly when out. Denies CP/palp/SOB/HA/congestion/fevers/GI or GU c/o. Taking meds as prescribed  Past Medical History:  Diagnosis Date  . Allergic state 12/18/2008   Qualifier: Diagnosis of  By: [name], [name]    . Asthma    cough variant  . Chicken pox as a child  . Hyperglycemia 10/28/2015  . Hyperlipidemia, mixed 10/28/2015  . Insomnia 09/09/2016  . Obesity 09/09/2016  . Polycystic ovarian syndrome 8-12  . Preventative health care 10/28/2015    Past Surgical History:  Procedure Laterality Date  . KNEE SURGERY  12-94, 10-07   X 2 left  . WISDOM TOOTH EXTRACTION  2001    Family History  Problem Relation Age of Onset  . Diabetes Mother        type 2  . Hypertension Mother   . Diabetes Father        type 2- off of everything since gastric bypass  . Mental illness Sister        bipolar, ADHD  . Mental illness Maternal Grandmother        manic and bipolar  . Stroke Maternal Grandmother   . Alcohol abuse Maternal Grandmother   . Hearing loss Maternal Grandmother   . Heart disease Maternal Grandfather   . Heart attack Maternal Grandfather   . Diabetes Paternal Grandmother        type 2  . Stroke Paternal Grandmother   . Thyroid disease Paternal Grandmother   . Stroke Paternal Grandfather   . Heart attack Paternal Grandfather   . Cancer Maternal Aunt 50       breast  . Cancer Maternal Aunt [id number]       breast     Social History   Socioeconomic History  .  Marital status: Married    Spouse name: Not on file  . Number of children: Not on file  . Years of education: Not on file  . Highest education level: Not on file  Occupational History  . Not on file  Social Needs  . Financial resource strain: Not on file  . Food insecurity    Worry: Not on file    Inability: Not on file  . Transportation needs    Medical: Not on file    Non-medical: Not on file  Tobacco Use  . Smoking status: Never Smoker  . Smokeless tobacco: Never Used  Substance and Sexual Activity  . Alcohol use: No  . Drug use: No  . 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 Physical activity    Days per week: Not on file    Minutes per session: Not on file  . Stress: Not on file  Relationships  . Social Musicianconnections    Talks on phone: Not on file    Gets together: Not on file    Attends religious service: Not on  file    Active member of club or organization: Not on file    Attends meetings of clubs or organizations: Not on file    Relationship status: Not on file  . Intimate partner violence    Fear of current or ex partner: Not on file    Emotionally abused: Not on file    Physically abused: Not on file    Forced sexual activity: Not on file  Other Topics Concern  . Not on file  Social History Narrative  . Not on file    Outpatient Medications Prior to Visit  Medication Sig Dispense Refill  . albuterol (PROVENTIL) (2.5 MG/3ML) 0.083% nebulizer solution Take 3 mLs (2.5 mg total) by nebulization every 4 (four) hours as needed for wheezing or shortness of breath. 75 mL 1  . calcium carbonate (OS-CAL) 600 MG TABS Take 600 mg by mouth daily.    . cetirizine (ZYRTEC ALLERGY) 10 MG tablet Take 10 mg by mouth daily.    . chlorthalidone (HYGROTON) 25 MG tablet Take 1 tablet (25 mg total) by mouth daily. 90 tablet 1  . Cyanocobalamin (B12 FAST DISSOLVE PO) Take by mouth.    .  Pediatric Multiple Vitamins (FLINTSTONES MULTIVITAMIN PO) Take 2 tablets by mouth.    . QSYMIA 7.5-46 MG CP24 TK 1 C PO QD    . albuterol (PROVENTIL HFA;VENTOLIN HFA) 108 (90 BASE) MCG/ACT inhaler Inhale 2 puffs into the lungs every 6 (six) hours as needed for wheezing. 18 g 1  . rosuvastatin (CRESTOR) 5 MG tablet Take 1 tablet (5 mg total) by mouth every other day. 45 tablet 1  . omeprazole (PRILOSEC) 20 MG capsule Take 20 mg by mouth daily.     No facility-administered medications prior to visit.     Allergies  Allergen Reactions  . Corticosteroids Anaphylaxis  . Eggs Or Egg-Derived Products   . Latex   . Metformin And Related Nausea And Vomiting  . Penicillins     REACTION: SWELLING  . Sulfa Antibiotics Itching    Review of Systems  Constitutional: Negative for chills, fever and malaise/fatigue.  HENT: Negative for congestion and hearing loss.   Eyes: Negative for discharge.  Respiratory: Negative for cough, sputum production and shortness of breath.   Cardiovascular: Negative for chest pain, palpitations and leg swelling.  Gastrointestinal: Negative for abdominal pain, blood in stool, constipation, diarrhea, heartburn, nausea and vomiting.  Genitourinary: Negative for dysuria, frequency, hematuria and urgency.  Musculoskeletal: Negative for back pain, falls and myalgias.  Skin: Negative for rash.  Neurological: Negative for dizziness, sensory change, loss of consciousness, weakness and headaches.  Endo/Heme/Allergies: Negative for environmental allergies. Does not bruise/bleed easily.  Psychiatric/Behavioral: Negative for depression and suicidal ideas. The patient is not nervous/anxious and does not have insomnia.        Objective:    Physical Exam Constitutional:      General: She is not in acute distress.    Appearance: She is well-developed.  HENT:     Head: Normocephalic and atraumatic.  Eyes:     Conjunctiva/sclera: Conjunctivae normal.  Neck:      Musculoskeletal: Neck supple.     Thyroid: No thyromegaly.  Cardiovascular:     Rate and Rhythm: Normal rate and regular rhythm.     Heart sounds: Normal heart sounds. No murmur.  Pulmonary:     Effort: Pulmonary effort is normal. No respiratory distress.     Breath sounds: Normal breath sounds.  Abdominal:     General:  Bowel sounds are normal. There is no distension.     Palpations: Abdomen is soft. There is no mass.     Tenderness: There is no abdominal tenderness.  Lymphadenopathy:     Cervical: No cervical adenopathy.  Skin:    General: Skin is warm and dry.  Neurological:     Mental Status: She is alert and oriented to person, place, and time.  Psychiatric:        Behavior: Behavior normal.     BP (!) 142/78 (BP Location: Left Arm, Patient Position: Sitting, Cuff Size: Normal)   Pulse 91   Temp (!) 97.1 F (36.2 C) (Temporal)   Resp 18   Ht 4\' 11"  (1.499 m)   Wt 180 lb 3.2 oz (81.7 kg)   SpO2 98%   BMI 36.40 kg/m  Wt Readings from Last 3 Encounters:  06/09/19 180 lb 3.2 oz (81.7 kg)  10/14/18 169 lb 6.4 oz (76.8 kg)  04/23/18 166 lb 3.2 oz (75.4 kg)    Diabetic Foot Exam - Simple   No data filed     Lab Results  Component Value Date   WBC 9.7 06/09/2019   HGB 12.2 06/09/2019   HCT 37.5 06/09/2019   PLT 349.0 06/09/2019   GLUCOSE 82 06/09/2019   CHOL 235 (H) 06/09/2019   TRIG 139.0 06/09/2019   HDL 60.50 06/09/2019   LDLDIRECT 161.0 10/22/2015   LDLCALC 146 (H) 06/09/2019   ALT 16 06/09/2019   AST 15 06/09/2019   NA 137 06/09/2019   K 4.0 06/09/2019   CL 97 06/09/2019   CREATININE 0.69 06/09/2019   BUN 15 06/09/2019   CO2 30 06/09/2019   TSH 2.04 06/09/2019   HGBA1C 6.1 06/09/2019    Lab Results  Component Value Date   TSH 2.04 06/09/2019   Lab Results  Component Value Date   WBC 9.7 06/09/2019   HGB 12.2 06/09/2019   HCT 37.5 06/09/2019   MCV 80.7 06/09/2019   PLT 349.0 06/09/2019   Lab Results  Component Value Date   NA 137  06/09/2019   K 4.0 06/09/2019   CO2 30 06/09/2019   GLUCOSE 82 06/09/2019   BUN 15 06/09/2019   CREATININE 0.69 06/09/2019   BILITOT 0.3 06/09/2019   ALKPHOS 53 06/09/2019   AST 15 06/09/2019   ALT 16 06/09/2019   PROT 6.9 06/09/2019   ALBUMIN 4.3 06/09/2019   CALCIUM 9.8 06/09/2019   GFR 92.73 06/09/2019   Lab Results  Component Value Date   CHOL 235 (H) 06/09/2019   Lab Results  Component Value Date   HDL 60.50 06/09/2019   Lab Results  Component Value Date   LDLCALC 146 (H) 06/09/2019   Lab Results  Component Value Date   TRIG 139.0 06/09/2019   Lab Results  Component Value Date   CHOLHDL 4 06/09/2019   Lab Results  Component Value Date   HGBA1C 6.1 06/09/2019       Assessment & Plan:   Problem List Items Addressed This Visit    Cough variant asthma    No recent exacerbation but given a refill on her Albuterol to keep around.       Relevant Medications   albuterol (VENTOLIN HFA) 108 (90 Base) MCG/ACT inhaler   Preventative health care    Patient encouraged to maintain heart healthy diet, regular exercise, adequate sleep. Consider daily probiotics. Take medications as prescribed      Hyperlipidemia, mixed    Encouraged heart healthy diet, increase  exercise, avoid trans fats, consider a krill oil cap daily      Relevant Medications   rosuvastatin (CRESTOR) 5 MG tablet   Other Relevant Orders   Lipid panel (Completed)   Hyperglycemia    hgba1c acceptable, minimize simple carbs. Increase exercise as tolerated.       Relevant Orders   Hemoglobin A1c (Completed)   Secondary hypertension - Primary    Well controlled, no changes to meds. Encouraged heart healthy diet such as the DASH diet and exercise as tolerated.       Relevant Medications   rosuvastatin (CRESTOR) 5 MG tablet   Other Relevant Orders   CBC (Completed)   Comprehensive metabolic panel (Completed)   TSH (Completed)    Other Visit Diagnoses    Mild intermittent asthma with acute  exacerbation       Relevant Medications   albuterol (VENTOLIN HFA) 108 (90 Base) MCG/ACT inhaler      I have discontinued Ruben Gottron. Nishikawa "Liz"'s rosuvastatin. I have also changed her albuterol and omeprazole. Additionally, I am having her start on rosuvastatin. Lastly, I am having her maintain her calcium carbonate, Pediatric Multiple Vitamins (FLINTSTONES MULTIVITAMIN PO), albuterol, cetirizine, Cyanocobalamin (B12 FAST DISSOLVE PO), Qsymia, and chlorthalidone.  Meds ordered this encounter  Medications  . albuterol (VENTOLIN HFA) 108 (90 Base) MCG/ACT inhaler    Sig: Inhale 2 puffs into the lungs every 6 (six) hours as needed for wheezing.    Dispense:  18 g    Refill:  1  . omeprazole (PRILOSEC) 20 MG capsule    Sig: Take 1 capsule (20 mg total) by mouth daily.    Dispense:  30 capsule    Refill:  11  . rosuvastatin (CRESTOR) 5 MG tablet    Sig: Take 1 tablet (5 mg total) by mouth daily.    Dispense:  90 tablet    Refill:  3     Danise Edge, MD

## 2019-07-05 ENCOUNTER — Encounter: Payer: Self-pay | Admitting: Family Medicine

## 2019-10-28 ENCOUNTER — Other Ambulatory Visit: Payer: Self-pay | Admitting: Family Medicine

## 2019-10-28 DIAGNOSIS — N6489 Other specified disorders of breast: Secondary | ICD-10-CM

## 2019-10-31 ENCOUNTER — Other Ambulatory Visit: Payer: Self-pay | Admitting: Family Medicine

## 2019-11-23 ENCOUNTER — Ambulatory Visit
Admission: RE | Admit: 2019-11-23 | Discharge: 2019-11-23 | Disposition: A | Discharge status: Home / Self Care | Payer: BC Managed Care – PPO | Source: Ambulatory Visit | Attending: Family Medicine | Admitting: Family Medicine

## 2019-11-23 ENCOUNTER — Ambulatory Visit: Admission: RE | Admit: 2019-11-23 | Payer: BC Managed Care – PPO | Source: Ambulatory Visit

## 2019-11-23 ENCOUNTER — Other Ambulatory Visit: Payer: Self-pay

## 2019-11-23 DIAGNOSIS — N6489 Other specified disorders of breast: Secondary | ICD-10-CM

## 2020-01-09 ENCOUNTER — Ambulatory Visit (INDEPENDENT_AMBULATORY_CARE_PROVIDER_SITE_OTHER): Payer: BC Managed Care – PPO | Admitting: Family Medicine

## 2020-01-09 ENCOUNTER — Encounter: Payer: Self-pay | Admitting: Family Medicine

## 2020-01-09 ENCOUNTER — Other Ambulatory Visit: Payer: Self-pay

## 2020-01-09 VITALS — BP 118/76 | HR 90 | Temp 98.3°F | Resp 12 | Ht 59.0 in | Wt 180.2 lb

## 2020-01-09 DIAGNOSIS — H9203 Otalgia, bilateral: Secondary | ICD-10-CM

## 2020-01-09 DIAGNOSIS — H6093 Unspecified otitis externa, bilateral: Secondary | ICD-10-CM

## 2020-01-09 DIAGNOSIS — E282 Polycystic ovarian syndrome: Secondary | ICD-10-CM

## 2020-01-09 DIAGNOSIS — E782 Mixed hyperlipidemia: Secondary | ICD-10-CM

## 2020-01-09 DIAGNOSIS — T7840XD Allergy, unspecified, subsequent encounter: Secondary | ICD-10-CM

## 2020-01-09 DIAGNOSIS — R739 Hyperglycemia, unspecified: Secondary | ICD-10-CM

## 2020-01-09 DIAGNOSIS — E6609 Other obesity due to excess calories: Secondary | ICD-10-CM | POA: Diagnosis not present

## 2020-01-09 LAB — CBC WITH DIFFERENTIAL/PLATELET
Basophils Absolute: 0.1 10*3/uL (ref 0.0–0.1)
Basophils Relative: 0.8 % (ref 0.0–3.0)
Eosinophils Absolute: 0.3 10*3/uL (ref 0.0–0.7)
Eosinophils Relative: 3.5 % (ref 0.0–5.0)
HCT: 37 % (ref 36.0–46.0)
Hemoglobin: 12.1 g/dL (ref 12.0–15.0)
Lymphocytes Relative: 17.3 % (ref 12.0–46.0)
Lymphs Abs: 1.6 10*3/uL (ref 0.7–4.0)
MCHC: 32.8 g/dL (ref 30.0–36.0)
MCV: 80 fl (ref 78.0–100.0)
Monocytes Absolute: 0.7 10*3/uL (ref 0.1–1.0)
Monocytes Relative: 7.1 % (ref 3.0–12.0)
Neutro Abs: 6.7 10*3/uL (ref 1.4–7.7)
Neutrophils Relative %: 71.3 % (ref 43.0–77.0)
Platelets: 365 10*3/uL (ref 150.0–400.0)
RBC: 4.62 Mil/uL (ref 3.87–5.11)
RDW: 14.3 % (ref 11.5–15.5)
WBC: 9.4 10*3/uL (ref 4.0–10.5)

## 2020-01-09 LAB — LIPID PANEL
Cholesterol: 259 mg/dL — ABNORMAL HIGH (ref 0–200)
HDL: 52.4 mg/dL (ref >39.00)
NonHDL: 206.21
Total CHOL/HDL Ratio: 5
Triglycerides: 205 mg/dL — ABNORMAL HIGH (ref 0.0–149.0)
VLDL: 41 mg/dL — ABNORMAL HIGH (ref 0.0–40.0)

## 2020-01-09 LAB — LDL CHOLESTEROL, DIRECT: Direct LDL: 169 mg/dL

## 2020-01-09 LAB — COMPREHENSIVE METABOLIC PANEL
ALT: 16 U/L (ref 0–35)
AST: 15 U/L (ref 0–37)
Albumin: 4.4 g/dL (ref 3.5–5.2)
Alkaline Phosphatase: 64 U/L (ref 39–117)
BUN: 16 mg/dL (ref 6–23)
CO2: 31 mEq/L (ref 19–32)
Calcium: 9.4 mg/dL (ref 8.4–10.5)
Chloride: 95 mEq/L — ABNORMAL LOW (ref 96–112)
Creatinine, Ser: 0.81 mg/dL (ref 0.40–1.20)
GFR: 76.85 mL/min (ref >60.00)
Glucose, Bld: 131 mg/dL — ABNORMAL HIGH (ref 70–99)
Potassium: 3.5 mEq/L (ref 3.5–5.1)
Sodium: 135 mEq/L (ref 135–145)
Total Bilirubin: 0.5 mg/dL (ref 0.2–1.2)
Total Protein: 7.1 g/dL (ref 6.0–8.3)

## 2020-01-09 LAB — HEMOGLOBIN A1C: Hgb A1c MFr Bld: 5.9 % (ref 4.6–6.5)

## 2020-01-09 MED ORDER — AZELASTINE HCL 0.1 % NA SOLN: 2.0000 | Freq: Two times a day (BID) | NASAL | 5 refills | Status: AC

## 2020-01-09 MED ORDER — NEOMYCIN-POLYMYXIN-HC 3.5-10000-1 OT SOLN
4.0000 [drp] | Freq: Three times a day (TID) | OTIC | 0 refills | Status: DC
Start: 2020-01-09 — End: 2020-01-10

## 2020-01-09 NOTE — Patient Instructions (Signed)
Otitis Externa  Otitis externa is an infection of the outer ear canal. The outer ear canal is the area between the outside of the ear and the eardrum. Otitis externa is sometimes called swimmer's ear. What are the causes? Common causes of this condition include:  Swimming in dirty water.  Moisture in the ear.  An injury to the inside of the ear.  An object stuck in the ear.  A cut or scrape on the outside of the ear. What increases the risk? You are more likely to develop this condition if you go swimming often. What are the signs or symptoms? The first symptom of this condition is often itching in the ear. Later symptoms of the condition include:  Swelling of the ear.  Redness in the ear.  Ear pain. The pain may get worse when you pull on your ear.  Pus coming from the ear. How is this diagnosed? This condition may be diagnosed by examining the ear and testing fluid from the ear for bacteria and funguses. How is this treated? This condition may be treated with:  Antibiotic ear drops. These are often given for 10-14 days.  Medicines to reduce itching and swelling. Follow these instructions at home:  If you were prescribed antibiotic ear drops, use them as told by your health care provider. Do not stop using the antibiotic even if your condition improves.  Take over-the-counter and prescription medicines only as told by your health care provider.  Avoid getting water in your ears as told by your health care provider. This may include avoiding swimming or water sports for a few days.  Keep all follow-up visits as told by your health care provider. This is important. How is this prevented?  Keep your ears dry. Use the corner of a towel to dry your ears after you swim or bathe.  Avoid scratching or putting things in your ear. Doing these things can damage the ear canal or remove the protective wax that lines it, which makes it easier for bacteria and funguses to  grow.  Avoid swimming in lakes, polluted water, or pools that may not have enough chlorine. Contact a health care provider if:  You have a fever.  Your ear is still red, swollen, painful, or draining pus after 3 days.  Your redness, swelling, or pain gets worse.  You have a severe headache.  You have redness, swelling, pain, or tenderness in the area behind your ear. Summary  Otitis externa is an infection of the outer ear canal.  Common causes include swimming in dirty water, moisture in the ear, or a cut or scrape in the ear.  Symptoms include pain, redness, and swelling of the ear.  If you were prescribed antibiotic ear drops, use them as told by your health care provider. Do not stop using the antibiotic even if your condition improves. This information is not intended to replace advice given to you by your health care provider. Make sure you discuss any questions you have with your health care provider. Document Revised: 01/29/2018 Document Reviewed: 01/29/2018 Elsevier Patient Education  2020 Elsevier Inc.  

## 2020-01-09 NOTE — Assessment & Plan Note (Signed)
[place] so [place] risk. Started on Cortisporin otic tid and report if no improvement. Clean ears weekly with 3 drops of peroxide both ears after showering.

## 2020-01-09 NOTE — Progress Notes (Signed)
Patient ID: Veronica Stokes, female   DOB: 11-25-1975, 44 y.o.   MRN: [id number]   Subjective:    Patient ID: Veronica Stokes, female    DOB: Feb 05, 1976, 44 y.o.   MRN: [id number]  Chief Complaint  Patient presents with  . Ear Pain    right  . Ear Fullness    right, feels clogged    HPI Patient is in today for evaluation of ear pain and allergies and follow up on chronic medical concerns. 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 She notes increased congestion from allergies as well. Right ear is worse than left. She switched from Zyrtec to [place] and that helped only slightly. Denies CP/palp/SOB/HA/fevers/GI or GU c/o. Taking meds as prescribed. No c/o polyuria or polydipsia.   Past Medical History:  Diagnosis Date  . Allergic state 12/18/2008   Qualifier: Diagnosis of  By: Birdie Riddle MD, Belenda Cruise    . Asthma    cough variant  . Chicken pox as a child  . Hyperglycemia 10/28/2015  . Hyperlipidemia, mixed 10/28/2015  . Insomnia 09/09/2016  . Obesity 09/09/2016  . Polycystic ovarian syndrome 8-12  . Preventative health care 10/28/2015    Past Surgical History:  Procedure Laterality Date  . KNEE SURGERY  12-94, 10-07   X 2 left  . WISDOM TOOTH EXTRACTION  2001    Family History  Problem Relation Age of Onset  . Diabetes Mother        type 2  . Hypertension Mother   . Diabetes Father        type 2- off of everything since gastric bypass  . Mental illness Sister        bipolar, ADHD  . Mental illness Maternal Grandmother        manic and bipolar  . Stroke Maternal Grandmother   . Alcohol abuse Maternal Grandmother   . Hearing loss Maternal Grandmother   . Heart disease Maternal Grandfather   . Heart attack Maternal Grandfather   . Diabetes Paternal Grandmother        type 2  . Stroke Paternal Grandmother   .  Thyroid disease Paternal Grandmother   . Stroke Paternal Grandfather   . Heart attack Paternal Grandfather   . Cancer Maternal Aunt 50       breast  . Cancer Maternal Aunt 32       breast     Social History   Socioeconomic History  . Marital status: Married    Spouse name: Not on file  . Number of children: Not on file  . Years of education: Not on file  . Highest education level: Not on file  Occupational History  . Not on file  Tobacco Use  . Smoking status: Never Smoker  . Smokeless tobacco: Never Used  Substance and Sexual Activity  . Alcohol use: No  . Drug use: No  . 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 Not on file  Social History Narrative  . Not on file   Social Determinants of Health   Financial Resource Strain:   . Difficulty of Paying Living Expenses:   Food Insecurity:   . Worried About [employer] of  Food in the Last Year:   . [place] in the Last Year:   Transportation Needs:   . Film/video editor (Medical):   [name] Lack of Transportation (Non-Medical):   Physical Activity:   . Days of Exercise per Week:   . Minutes of Exercise per Session:   Stress:   . Feeling of Stress :   Social Connections:   . Frequency of Communication with Friends and Family:   . Frequency of Social Gatherings with Friends and Family:   . Attends Religious Services:   . Active Member of Clubs or Organizations:   . Attends Archivist Meetings:   [name] Marital Status:   Intimate Partner Violence:   . Fear of Current or Ex-Partner:   . Emotionally Abused:   [name] Physically Abused:   . Sexually Abused:     Outpatient Medications Prior to Visit  Medication Sig Dispense Refill  . albuterol (PROVENTIL) (2.5 MG/3ML) 0.083% nebulizer solution Take 3 mLs (2.5 mg total) by nebulization every 4 (four) hours as needed for wheezing or shortness of breath. 75 mL  1  . albuterol (VENTOLIN HFA) 108 (90 Base) MCG/ACT inhaler Inhale 2 puffs into the lungs every 6 (six) hours as needed for wheezing. 18 g 1  . calcium carbonate (OS-CAL) 600 MG TABS Take 600 mg by mouth daily.    . cetirizine (ZYRTEC ALLERGY) 10 MG tablet Take 10 mg by mouth daily.    . chlorthalidone (HYGROTON) 25 MG tablet TAKE 1 TABLET(25 MG) BY MOUTH DAILY 90 tablet 1  . Cyanocobalamin (B12 FAST DISSOLVE PO) Take by mouth.    [name] omeprazole (PRILOSEC) 20 MG capsule Take 1 capsule (20 mg total) by mouth daily. 30 capsule 11  . Pediatric Multiple Vitamins (FLINTSTONES MULTIVITAMIN PO) Take 2 tablets by mouth.    . QSYMIA 7.5-46 MG CP24 TK 1 C PO QD    . rosuvastatin (CRESTOR) 5 MG tablet Take 1 tablet (5 mg total) by mouth daily. 90 tablet 3   No facility-administered medications prior to visit.    Allergies  Allergen Reactions  . Corticosteroids Anaphylaxis  . Eggs Or Egg-Derived Products   . Latex   . Metformin And Related Nausea And Vomiting  . Penicillins     REACTION: SWELLING  . Sulfa Antibiotics Itching    Review of Systems  Constitutional: Negative for fever and malaise/fatigue.  HENT: Positive for congestion and ear pain. Negative for ear discharge, nosebleeds and tinnitus.   Eyes: Positive for redness. Negative for blurred vision.  Respiratory: Negative for shortness of breath.   Cardiovascular: Negative for chest pain, palpitations and leg swelling.  Gastrointestinal: Negative for abdominal pain, blood in stool and nausea.  Genitourinary: Negative for dysuria and frequency.  Musculoskeletal: Negative for falls.  Skin: Negative for rash.  Neurological: Negative for dizziness, loss of consciousness and headaches.  Endo/Heme/Allergies: Negative for environmental allergies.  Psychiatric/Behavioral: Negative for depression. The patient is not nervous/anxious.        Objective:    Physical Exam Vitals and nursing note reviewed.  Constitutional:      General: She is  not in acute distress.    Appearance: She is well-developed.  HENT:     Head: Normocephalic and atraumatic.     Right Ear: Tympanic membrane normal.     Left Ear: Tympanic membrane normal.     Ears:     Comments: Bilateral external canals erythematous and moist.     Nose: Nose normal.  Eyes:     General:        Right eye: No discharge.        Left eye: No discharge.  Cardiovascular:     Rate and Rhythm: Normal rate and regular rhythm.     Heart sounds: No murmur.  Pulmonary:     Effort: Pulmonary effort is normal.     Breath sounds: Normal breath sounds.  Abdominal:     General: Bowel sounds are normal.     Palpations: Abdomen is soft.     Tenderness: There is no abdominal tenderness.  Musculoskeletal:     Cervical back: Normal range of motion and neck supple.  Skin:    General: Skin is warm and dry.  Neurological:     Mental Status: She is alert and oriented to person, place, and time.     BP 118/76 (BP Location: Left Arm, Cuff Size: Large)   Pulse 90   Temp 98.3 F (36.8 C) (Temporal)   Resp 12   Ht _0  (1.499 m)   Wt 180 lb 3.2 oz (81.7 kg)   LMP 12/10/2019   SpO2 98%   BMI 36.40 kg/m  Wt Readings from Last 3 Encounters:  01/09/20 180 lb 3.2 oz (81.7 kg)  06/09/19 180 lb 3.2 oz (81.7 kg)  10/14/18 169 lb 6.4 oz (76.8 kg)    Diabetic Foot Exam - Simple   No data filed     Lab Results  Component Value Date   WBC 9.7 06/09/2019   HGB 12.2 06/09/2019   HCT 37.5 06/09/2019   PLT 349.0 06/09/2019   GLUCOSE 82 06/09/2019   CHOL 235 (H) 06/09/2019   TRIG 139.0 06/09/2019   HDL 60.50 06/09/2019   LDLDIRECT 161.0 10/22/2015   LDLCALC 146 (H) 06/09/2019   ALT 16 06/09/2019   AST 15 06/09/2019   NA 137 06/09/2019   K 4.0 06/09/2019   CL 97 06/09/2019   CREATININE 0.69 06/09/2019   BUN 15 06/09/2019   CO2 30 06/09/2019   TSH 2.04 06/09/2019   HGBA1C 6.1 06/09/2019    Lab Results  Component Value Date   TSH 2.04 06/09/2019   Lab Results    Component Value Date   WBC 9.7 06/09/2019   HGB 12.2 06/09/2019   HCT 37.5 06/09/2019   MCV 80.7 06/09/2019   PLT 349.0 06/09/2019   Lab Results  Component Value Date   NA 137 06/09/2019   K 4.0 06/09/2019   CO2 30 06/09/2019   GLUCOSE 82 06/09/2019   BUN 15 06/09/2019   CREATININE 0.69 06/09/2019   BILITOT 0.3 06/09/2019   ALKPHOS 53 06/09/2019   AST 15 06/09/2019   ALT 16 06/09/2019   PROT 6.9 06/09/2019   ALBUMIN 4.3 06/09/2019   CALCIUM 9.8 06/09/2019   GFR 92.73 06/09/2019   Lab Results  Component Value Date   CHOL 235 (H) 06/09/2019   Lab Results  Component Value Date   HDL 60.50 06/09/2019   Lab Results  Component Value Date   LDLCALC 146 (H) 06/09/2019   Lab Results  Component Value Date   TRIG 139.0 06/09/2019   Lab Results  Component Value Date   CHOLHDL 4 06/09/2019   Lab Results  Component Value Date   HGBA1C 6.1 06/09/2019       Assessment & Plan:   Problem List Items Addressed This Visit    Allergy    Added Astelin nose spray bid to see if she gets any relief.  Polycystic ovarian syndrome    Check insulin resistence and consider metformin      Hyperlipidemia, mixed    Encouraged heart healthy diet, increase exercise, avoid trans fats, consider a krill oil cap daily      Relevant Orders   Lipid panel   Hyperglycemia - Primary    hgba1c acceptable, minimize simple carbs. Increase exercise as tolerated.       Relevant Orders   Hemoglobin A1c   Comprehensive metabolic panel   Insulin, random   Obesity   Otitis externa of both ears    [place] at work with [place] so at risk. Started on Cortisporin otic tid and report if no improvement. Clean ears weekly with 3 drops of peroxide both ears after showering.        Other Visit Diagnoses    Ear pain, bilateral       Relevant Orders   CBC with Differential/Platelet      I am having Newt Lukes. Ehly "Kathlee Nations" start on azelastine and  neomycin-polymyxin-hydrocortisone. I am also having her maintain her calcium carbonate, Pediatric Multiple Vitamins (FLINTSTONES MULTIVITAMIN PO), albuterol, cetirizine, Cyanocobalamin (B12 FAST DISSOLVE PO), Qsymia, albuterol, omeprazole, rosuvastatin, and chlorthalidone.  Meds ordered this encounter  Medications  . azelastine (ASTELIN) 0.1 % nasal spray    Sig: Place 2 sprays into both nostrils 2 (two) times daily. Use in each nostril as directed    Dispense:  30 mL    Refill:  5  . neomycin-polymyxin-hydrocortisone (CORTISPORIN) OTIC solution    Sig: Place 4 drops into both ears 3 (three) times daily.    Dispense:  10 mL    Refill:  0     Penni Homans, MD

## 2020-01-09 NOTE — Assessment & Plan Note (Signed)
Check insulin resistence and consider metformin

## 2020-01-09 NOTE — Assessment & Plan Note (Signed)
Added Astelin nose spray bid to see if she gets any relief.

## 2020-01-09 NOTE — Assessment & Plan Note (Signed)
hgba1c acceptable, minimize simple carbs. Increase exercise as tolerated.  

## 2020-01-09 NOTE — Assessment & Plan Note (Signed)
Encouraged heart healthy diet, increase exercise, avoid trans fats, consider a krill oil cap daily 

## 2020-01-10 ENCOUNTER — Other Ambulatory Visit: Payer: Self-pay | Admitting: Family Medicine

## 2020-01-10 ENCOUNTER — Other Ambulatory Visit: Payer: Self-pay | Admitting: *Deleted

## 2020-01-10 ENCOUNTER — Telehealth: Payer: Self-pay | Admitting: *Deleted

## 2020-01-10 DIAGNOSIS — R739 Hyperglycemia, unspecified: Secondary | ICD-10-CM

## 2020-01-10 DIAGNOSIS — E782 Mixed hyperlipidemia: Secondary | ICD-10-CM

## 2020-01-10 LAB — INSULIN, RANDOM: Insulin: 59.9 u[IU]/mL — ABNORMAL HIGH

## 2020-01-10 MED ORDER — ACETIC ACID 2 % OT SOLN
4.0000 [drp] | Freq: Three times a day (TID) | OTIC | 0 refills | Status: DC
Start: 2020-01-10 — End: 2022-12-24

## 2020-01-10 MED ORDER — METFORMIN HCL ER 500 MG PO TB24: 500.0000 mg | ORAL_TABLET | Freq: Every day | ORAL | 1 refills | Status: DC

## 2020-01-10 NOTE — Telephone Encounter (Signed)
For the eye drops that were sent in yesterday cortisporin, pharmacy faxed stating that patient is highly allergic to corticosteroids and can we change products or advise?

## 2020-01-10 NOTE — Telephone Encounter (Signed)
I have sent in Vosol (acetic acid) ear drops. Please let her know

## 2020-01-10 NOTE — Telephone Encounter (Signed)
Left detailed message on machine.

## 2020-01-10 NOTE — Telephone Encounter (Signed)
Patient called to check the status on eye drop prescription.Patient states that she received a call from Pharmacy stating that she was highly allergic to medication.

## 2020-01-17 ENCOUNTER — Encounter: Payer: Self-pay | Admitting: Emergency Medicine

## 2020-01-17 ENCOUNTER — Emergency Department (INDEPENDENT_AMBULATORY_CARE_PROVIDER_SITE_OTHER): Payer: BC Managed Care – PPO

## 2020-01-17 ENCOUNTER — Other Ambulatory Visit: Payer: Self-pay | Admitting: Family Medicine

## 2020-01-17 ENCOUNTER — Encounter: Payer: Self-pay | Admitting: Family Medicine

## 2020-01-17 ENCOUNTER — Emergency Department (INDEPENDENT_AMBULATORY_CARE_PROVIDER_SITE_OTHER)
Admission: EM | Admit: 2020-01-17 | Discharge: 2020-01-17 | Disposition: A | Discharge status: Home / Self Care | Payer: BC Managed Care – PPO | Source: Home / Self Care

## 2020-01-17 ENCOUNTER — Other Ambulatory Visit: Payer: Self-pay

## 2020-01-17 DIAGNOSIS — W1830XA Fall on same level, unspecified, initial encounter: Secondary | ICD-10-CM | POA: Diagnosis not present

## 2020-01-17 DIAGNOSIS — M4306 Spondylolysis, lumbar region: Secondary | ICD-10-CM

## 2020-01-17 DIAGNOSIS — M533 Sacrococcygeal disorders, not elsewhere classified: Secondary | ICD-10-CM | POA: Diagnosis not present

## 2020-01-17 DIAGNOSIS — T7840XD Allergy, unspecified, subsequent encounter: Secondary | ICD-10-CM

## 2020-01-17 DIAGNOSIS — W010XXA Fall on same level from slipping, tripping and stumbling without subsequent striking against object, initial encounter: Secondary | ICD-10-CM

## 2020-01-17 DIAGNOSIS — H6980 Other specified disorders of Eustachian tube, unspecified ear: Secondary | ICD-10-CM

## 2020-01-17 MED ORDER — TRAMADOL HCL 50 MG PO TABS: 50.0000 mg | ORAL_TABLET | Freq: Four times a day (QID) | ORAL | 0 refills | Status: DC | PRN
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
## 2020-01-17 NOTE — ED Triage Notes (Signed)
Fall yesterday on 4x4 hit taibone

## 2020-01-17 NOTE — Discharge Instructions (Signed)
  Tramadol is strong pain medication. While taking, do not drink alcohol, drive, or perform any other activities that requires focus while taking these medications.   You may also continue to take Tylenol as needed for pain and alternate cool and warm compresses for comfort.  Follow up with family medicine in 1-2 weeks if not improving, follow up sooner if worsening.

## 2020-01-26 ENCOUNTER — Ambulatory Visit (INDEPENDENT_AMBULATORY_CARE_PROVIDER_SITE_OTHER): Payer: BC Managed Care – PPO | Admitting: Otolaryngology

## 2020-01-26 ENCOUNTER — Encounter (INDEPENDENT_AMBULATORY_CARE_PROVIDER_SITE_OTHER): Payer: Self-pay | Admitting: Otolaryngology

## 2020-01-26 ENCOUNTER — Other Ambulatory Visit: Payer: Self-pay

## 2020-01-26 VITALS — Temp 97.5°F

## 2020-01-26 DIAGNOSIS — H938X1 Other specified disorders of right ear: Secondary | ICD-10-CM

## 2020-01-26 NOTE — Progress Notes (Signed)
HPI: Veronica Stokes is a 44 y.o. female who presents is referred by Penni Homans, MD for evaluation of ears being stopped up.  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  She feels like she is blocked or congested all the time.  She is unable to use nasal steroid sprays as she is allergic to steroids.  She has recently started azelastine. The fullness in her ears especially on the right side seems to have worsened over the past month..  Past Medical History:  Diagnosis Date  . Allergic state 12/18/2008   Qualifier: Diagnosis of  By: Birdie Riddle MD, Belenda Cruise    . Asthma    cough variant  . Chicken pox as a child  . Hyperglycemia 10/28/2015  . Hyperlipidemia, mixed 10/28/2015  . Insomnia 09/09/2016  . Obesity 09/09/2016  . Polycystic ovarian syndrome 8-12  . Preventative health care 10/28/2015   Past Surgical History:  Procedure Laterality Date  . KNEE SURGERY  12-94, 10-07   X 2 left  . WISDOM TOOTH EXTRACTION  2001   Social History   Socioeconomic History  . Marital status: Married    Spouse name: Not on file  . Number of children: Not on file  . Years of education: Not on file  . Highest education level: Not on file  Occupational History  . Not on file  Tobacco Use  . Smoking status: Never Smoker  . Smokeless tobacco: Never Used  Substance and Sexual Activity  . Alcohol use: No  . Drug use: No  . 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 Not on file  Social History Narrative  . Not on file   Social Determinants of Health   Financial Resource Strain:   . Difficulty of Paying Living Expenses:   Food Insecurity:   . Worried About Charity fundraiser in the Last Year:   . Arboriculturist in the Last Year:   Transportation Needs:   . Film/video editor (Medical):   [name] Lack of Transportation (Non-Medical):   Physical Activity:   .  Days of Exercise per Week:   . Minutes of Exercise per Session:   Stress:   . Feeling of Stress :   Social Connections:   . Frequency of Communication with Friends and Family:   . Frequency of Social Gatherings with Friends and Family:   . Attends Religious Services:   . Active Member of Clubs or Organizations:   . Attends Archivist Meetings:   [name] Marital Status:    Family History  Problem Relation Age of Onset  . Diabetes Mother        type 2  . Hypertension Mother   . Diabetes Father        type 2- off of everything since gastric bypass  . Mental illness Sister        bipolar, ADHD  . Mental illness Maternal Grandmother        manic and bipolar  . Stroke Maternal Grandmother   . Alcohol abuse Maternal Grandmother   . Hearing loss Maternal Grandmother   . Heart disease Maternal Grandfather   . Heart attack Maternal Grandfather   . Diabetes Paternal Grandmother        type 2  . Stroke Paternal Grandmother   . Thyroid disease Paternal  Grandmother   . Stroke Paternal Grandfather   . Heart attack Paternal Grandfather   . Cancer Maternal Aunt 50       breast  . Cancer Maternal Aunt 50       breast    Allergies  Allergen Reactions  . Corticosteroids Anaphylaxis  . Eggs Or Egg-Derived Products   . Latex   . Metformin And Related Nausea And Vomiting  . Penicillins     REACTION: SWELLING  . Sulfa Antibiotics Itching   Prior to Admission medications   Medication Sig Start Date End Date Taking? Authorizing Provider  acetaminophen (TYLENOL) 500 MG tablet Take 500 mg by mouth every 6 (six) hours as needed.   Yes [provider]  acetic acid 2 % otic solution Place 4 drops into both ears 3 (three) times daily. 01/10/20  Yes Bradd Canary, MD  albuterol (PROVENTIL) (2.5 MG/3ML) 0.083% nebulizer solution Take 3 mLs (2.5 mg total) by nebulization every 4 (four) hours as needed for wheezing or shortness of breath. 09/05/15  Yes McGowen, Maryjean Morn, MD  albuterol  (VENTOLIN HFA) 108 (90 Base) MCG/ACT inhaler Inhale 2 puffs into the lungs every 6 (six) hours as needed for wheezing. 06/09/19  Yes Bradd Canary, MD  azelastine (ASTELIN) 0.1 % nasal spray Place 2 sprays into both nostrils 2 (two) times daily. Use in each nostril as directed 01/09/20  Yes Bradd Canary, MD  calcium carbonate (OS-CAL) 600 MG TABS Take 600 mg by mouth daily.   Yes [provider]  cetirizine (ZYRTEC ALLERGY) 10 MG tablet Take 10 mg by mouth daily.   Yes [provider]  chlorthalidone (HYGROTON) 25 MG tablet TAKE 1 TABLET(25 MG) BY MOUTH DAILY 10/31/19  Yes Bradd Canary, MD  Cyanocobalamin (B12 FAST DISSOLVE PO) Take by mouth.   Yes [provider]  metFORMIN (GLUCOPHAGE-XR) 500 MG 24 hr tablet Take 1 tablet (500 mg total) by mouth daily with breakfast. 01/10/20  Yes Bradd Canary, MD  omeprazole (PRILOSEC) 20 MG capsule Take 1 capsule (20 mg total) by mouth daily. 06/09/19 06/03/20 Yes Bradd Canary, MD  Pediatric Multiple Vitamins (FLINTSTONES MULTIVITAMIN PO) Take 2 tablets by mouth.   Yes [provider]  QSYMIA 7.5-46 MG CP24 TK 1 C PO QD 09/23/18  Yes [provider]  traMADol (ULTRAM) 50 MG tablet Take 1 tablet (50 mg total) by mouth every 6 (six) hours as needed. 01/17/20  Yes Phelps, Erin O, PA-C     Positive ROS: Otherwise negative  All other systems have been reviewed and were otherwise negative with the exception of those mentioned in the HPI and as above.  Physical Exam: Constitutional: Alert, well-appearing, no acute distress Ears: External ears without lesions or tenderness.  Ear canals are clear bilaterally.  TMs appear clear bilaterally with good mobility on pneumatic otoscopy.  On hearing screening she has perhaps slight decreased hearing with AC > BC bilaterally. Nasal: External nose without lesions. Septum midline with mild rhinitis..  Both middle meatus regions are clear with no signs of infection. Oral: Lips  and gums without lesions. Tongue and palate mucosa without lesions. Posterior oropharynx clear. Neck: No palpable adenopathy or masses. Respiratory: Breathing comfortably  Skin: No facial/neck lesions or rash noted.  Audiogram was performed in the office today and on audiologic testing patient had normal hearing in both ears with a SRT of 10 dB on the right side and 15 dB on the left side.  She had  type A tympanograms bilaterally.  Procedures  Assessment: Normal ear examination and hearing evaluation in the office today with bilateral type A tympanograms. Mild rhinitis.  Plan: Reviewed the hearing test with the patient in the office today.  No clinical evidence of any hearing problems.  Concerning the fullness in the ear agree with use of azelastine as well as occasional use of decongestants and/or antihistamines. Apparently she had a anaphylactic reaction to steroids and is unable to use nasal steroid sprays. She will follow-up as needed   Narda Bonds, MD   CC:

## 2020-02-03 ENCOUNTER — Encounter: Payer: Self-pay | Admitting: Family Medicine

## 2020-02-08 NOTE — Telephone Encounter (Signed)
Patient called back to check status ladt telephone sent on 02/03/2020

## 2020-02-08 NOTE — Telephone Encounter (Signed)
Left message on machine to call back  

## 2020-02-09 MED ORDER — PEN NEEDLES 32G X 4 MM MISC: 0 refills | Status: AC

## 2020-02-09 MED ORDER — SAXENDA 18 MG/3ML ~~LOC~~ SOPN: 0.6000 mg | PEN_INJECTOR | Freq: Every day | SUBCUTANEOUS | 0 refills | Status: DC

## 2020-02-09 MED ORDER — SAXENDA 18 MG/3ML ~~LOC~~ SOPN: 0.6000 mg | PEN_INJECTOR | SUBCUTANEOUS | 0 refills | Status: DC

## 2020-02-09 NOTE — Addendum Note (Signed)
Addended by: Thelma Barge D on: 02/09/2020 04:58 PM   Modules accepted: Orders

## 2020-02-10 ENCOUNTER — Telehealth: Payer: Self-pay

## 2020-02-10 NOTE — Telephone Encounter (Signed)
PA initiated via Covermymeds;KEY: [id number]. Awaiting determination.

## 2020-02-10 NOTE — Telephone Encounter (Signed)
Opened in error

## 2020-02-13 ENCOUNTER — Other Ambulatory Visit: Payer: Self-pay | Admitting: *Deleted

## 2020-02-13 ENCOUNTER — Encounter (INDEPENDENT_AMBULATORY_CARE_PROVIDER_SITE_OTHER): Payer: Self-pay

## 2020-02-13 MED ORDER — FREESTYLE FREEDOM LITE W/DEVICE KIT: PACK | 0 refills | Status: DC

## 2020-02-13 MED ORDER — SAXENDA 18 MG/3ML ~~LOC~~ SOPN: 0.6000 mg | PEN_INJECTOR | Freq: Every day | SUBCUTANEOUS | 1 refills | Status: DC

## 2020-02-13 MED ORDER — FREESTYLE LANCETS MISC: 1 refills | Status: AC

## 2020-02-13 MED ORDER — FREESTYLE LITE TEST VI STRP: ORAL_STRIP | 1 refills | Status: DC

## 2020-02-13 NOTE — Addendum Note (Signed)
Addended by: Thelma Barge D on: 02/13/2020 10:50 AM   Modules accepted: Orders

## 2020-02-13 NOTE — Addendum Note (Signed)
Addended by: Thelma Barge D on: 02/13/2020 03:57 PM   Modules accepted: Orders

## 2020-02-13 NOTE — Telephone Encounter (Signed)
mychart message sent to patient

## 2020-02-13 NOTE — Telephone Encounter (Signed)
PA approved.  Effective from 02/10/2020 through 06/14/2020.

## 2020-02-13 NOTE — Telephone Encounter (Signed)
Pt called in to see if her Saxenda could be sent in for the box and not a single pen. She states the way it was written was for only one pen.

## 2020-02-23 ENCOUNTER — Telehealth (INDEPENDENT_AMBULATORY_CARE_PROVIDER_SITE_OTHER): Payer: BC Managed Care – PPO | Admitting: Family Medicine

## 2020-02-23 ENCOUNTER — Other Ambulatory Visit: Payer: Self-pay

## 2020-02-23 DIAGNOSIS — R739 Hyperglycemia, unspecified: Secondary | ICD-10-CM | POA: Diagnosis not present

## 2020-02-23 DIAGNOSIS — I159 Secondary hypertension, unspecified: Secondary | ICD-10-CM

## 2020-02-23 DIAGNOSIS — E6609 Other obesity due to excess calories: Secondary | ICD-10-CM

## 2020-02-23 DIAGNOSIS — E282 Polycystic ovarian syndrome: Secondary | ICD-10-CM

## 2020-02-23 MED ORDER — SAXENDA 18 MG/3ML ~~LOC~~ SOPN: 1.2000 mg | PEN_INJECTOR | Freq: Every day | SUBCUTANEOUS | 1 refills | Status: DC

## 2020-02-23 NOTE — Assessment & Plan Note (Signed)
Has tolerated Saxenda at 0.6 daily. Will increase to 1.2 per day x 1 week then increase to 1.8 daily and hold til next visit.

## 2020-02-23 NOTE — Assessment & Plan Note (Signed)
hgba1c acceptable, minimize simple carbs. Increase exercise as tolerated. Continue current meds 

## 2020-02-23 NOTE — Assessment & Plan Note (Signed)
Well controlled, no changes to meds. Encouraged heart healthy diet such as the DASH diet and exercise as tolerated.  °

## 2020-02-23 NOTE — Progress Notes (Signed)
Virtual Visit via Video Note  I connected with Veronica Stokes on 02/23/20 at  3:20 PM EDT by a video enabled telemedicine application and verified that I am speaking with the correct person using two identifiers.  Location: Patient: home Provider: office   I discussed the limitations of evaluation and management by telemedicine and the availability of in person appointments. The patient expressed understanding and agreed to proceed. Marin Roberts, CMA was able to keep the patient set up on a video visit   Subjective:    Patient ID: Veronica Stokes, female    DOB: 1976/04/27, 44 y.o.   MRN: [id number]  Chief Complaint  Patient presents with  . Weight Management    Follow up    HPI Patient is in today for follow up on chronic medical concerns. She feels much better with the switch from shot to long acting metformin and the addition of Saxenda. She has no more trouble with diarrhea, her hair on her chin is gone and she is growing more hair on her head. No recent febrile illness or hospitalizations. No polyuria or polydipsia. Denies CP/palp/SOB/HA/congestion/fevers/GI or GU c/o. Taking meds as prescribed  Past Medical History:  Diagnosis Date  . Allergic state 12/18/2008   Qualifier: Diagnosis of  By: Birdie Riddle MD, Belenda Cruise    . Asthma    cough variant  . Chicken pox as a child  . Hyperglycemia 10/28/2015  . Hyperlipidemia, mixed 10/28/2015  . Insomnia 09/09/2016  . Obesity 09/09/2016  . Polycystic ovarian syndrome 8-12  . Preventative health care 10/28/2015    Past Surgical History:  Procedure Laterality Date  . KNEE SURGERY  12-94, 10-07   X 2 left  . WISDOM TOOTH EXTRACTION  2001    Family History  Problem Relation Age of Onset  . Diabetes Mother        type 2  . Hypertension Mother   . Diabetes Father        type 2- off of everything since gastric bypass  . Mental illness Sister        bipolar, ADHD  . Mental illness Maternal Grandmother        manic and  bipolar  . Stroke Maternal Grandmother   . Alcohol abuse Maternal Grandmother   . Hearing loss Maternal Grandmother   . Heart disease Maternal Grandfather   . Heart attack Maternal Grandfather   . Diabetes Paternal Grandmother        type 2  . Stroke Paternal Grandmother   . Thyroid disease Paternal Grandmother   . Stroke Paternal Grandfather   . Heart attack Paternal Grandfather   . Cancer Maternal Aunt 50       breast  . Cancer Maternal Aunt 93       breast     Social History   Socioeconomic History  . Marital status: Married    Spouse name: Not on file  . Number of children: Not on file  . Years of education: Not on file  . Highest education level: Not on file  Occupational History  . Not on file  Tobacco Use  . Smoking status: Never Smoker  . Smokeless tobacco: Never Used  Vaping Use  . Vaping Use: Never used  Substance and Sexual Activity  . Alcohol use: No  . Drug use: No  . 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  Not on file  Social History Narrative  . Not on file   Social Determinants of Health   Financial Resource Strain:   . Difficulty of Paying Living Expenses:   Food Insecurity:   . Worried About Charity fundraiser in the Last Year:   . Arboriculturist in the Last Year:   Transportation Needs:   . Film/video editor (Medical):   [name] Lack of Transportation (Non-Medical):   Physical Activity:   . Days of Exercise per Week:   . Minutes of Exercise per Session:   Stress:   . Feeling of Stress :   Social Connections:   . Frequency of Communication with Friends and Family:   . Frequency of Social Gatherings with Friends and Family:   . Attends Religious Services:   . Active Member of Clubs or Organizations:   . Attends Archivist Meetings:   [name] Marital Status:   Intimate Partner Violence:   . Fear of Current or Ex-Partner:   .  Emotionally Abused:   [name] Physically Abused:   . Sexually Abused:     Outpatient Medications Prior to Visit  Medication Sig Dispense Refill  . acetaminophen (TYLENOL) 500 MG tablet Take 500 mg by mouth every 6 (six) hours as needed.    [name] acetic acid 2 % otic solution Place 4 drops into both ears 3 (three) times daily. 15 mL 0  . albuterol (PROVENTIL) (2.5 MG/3ML) 0.083% nebulizer solution Take 3 mLs (2.5 mg total) by nebulization every 4 (four) hours as needed for wheezing or shortness of breath. 75 mL 1  . albuterol (VENTOLIN HFA) 108 (90 Base) MCG/ACT inhaler Inhale 2 puffs into the lungs every 6 (six) hours as needed for wheezing. 18 g 1  . azelastine (ASTELIN) 0.1 % nasal spray Place 2 sprays into both nostrils 2 (two) times daily. Use in each nostril as directed 30 mL 5  . Blood Glucose Monitoring Suppl (FREESTYLE FREEDOM LITE) w/Device KIT Use to check sugar once a day.  Dx Code: E16.2 1 kit 0  . calcium carbonate (OS-CAL) 600 MG TABS Take 600 mg by mouth daily.    . cetirizine (ZYRTEC ALLERGY) 10 MG tablet Take 10 mg by mouth daily.    . chlorthalidone (HYGROTON) 25 MG tablet TAKE 1 TABLET(25 MG) BY MOUTH DAILY 90 tablet 1  . Cyanocobalamin (B12 FAST DISSOLVE PO) Take by mouth.    [name] glucose blood (FREESTYLE LITE) test strip Use to check sugar once a day.  Dx Code: E16.2 50 each 1  . Insulin Pen Needle (PEN NEEDLES) 32G X 4 MM MISC Use with Saxenda once daily 100 each 0  . Lancets (FREESTYLE) lancets Use to check sugar once a day.  Dx Code: E16.2 100 each 1  . metFORMIN (GLUCOPHAGE-XR) 500 MG 24 hr tablet Take 1 tablet (500 mg total) by mouth daily with breakfast. 90 tablet 1  . omeprazole (PRILOSEC) 20 MG capsule Take 1 capsule (20 mg total) by mouth daily. 30 capsule 11  . Pediatric Multiple Vitamins (FLINTSTONES MULTIVITAMIN PO) Take 2 tablets by mouth.    . Liraglutide -Weight Management (SAXENDA) 18 MG/3ML SOPN Inject 0.1 mLs (0.6 mg total) into the skin daily. 5 pen 1  . QSYMIA  7.5-46 MG CP24 TK 1 C PO QD    . traMADol (ULTRAM) 50 MG tablet Take 1 tablet (50 mg total) by mouth every 6 (six) hours as needed. 15 tablet 0   No facility-administered medications prior  to visit.    Allergies  Allergen Reactions  . Corticosteroids Anaphylaxis  . Eggs Or Egg-Derived Products   . Latex   . Metformin And Related Nausea And Vomiting  . Penicillins     REACTION: SWELLING  . Sulfa Antibiotics Itching    Review of Systems  Constitutional: Negative for fever and malaise/fatigue.  HENT: Negative for congestion.   Eyes: Negative for blurred vision.  Respiratory: Negative for shortness of breath.   Cardiovascular: Negative for chest pain, palpitations and leg swelling.  Gastrointestinal: Negative for abdominal pain, blood in stool and nausea.  Genitourinary: Negative for dysuria and frequency.  Musculoskeletal: Negative for falls.  Skin: Negative for rash.  Neurological: Negative for dizziness, loss of consciousness and headaches.  Endo/Heme/Allergies: Negative for environmental allergies.  Psychiatric/Behavioral: Negative for depression. The patient is not nervous/anxious.        Objective:    Physical Exam Constitutional:      Appearance: Normal appearance. She is obese. She is not ill-appearing.  HENT:     Head: Normocephalic and atraumatic.     Right Ear: External ear normal.     Left Ear: External ear normal.     Nose: Nose normal.  Eyes:     General:        Right eye: No discharge.        Left eye: No discharge.  Pulmonary:     Effort: Pulmonary effort is normal.  Neurological:     Mental Status: She is alert and oriented to person, place, and time.  Psychiatric:        Behavior: Behavior normal.     There were no vitals taken for this visit. Wt Readings from Last 3 Encounters:  01/17/20 170 lb (77.1 kg)  01/09/20 180 lb 3.2 oz (81.7 kg)  06/09/19 180 lb 3.2 oz (81.7 kg)    Diabetic Foot Exam - Simple   No data filed     Lab Results    Component Value Date   WBC 9.4 01/09/2020   HGB 12.1 01/09/2020   HCT 37.0 01/09/2020   PLT 365.0 01/09/2020   GLUCOSE 131 (H) 01/09/2020   CHOL 259 (H) 01/09/2020   TRIG 205.0 (H) 01/09/2020   HDL 52.40 01/09/2020   LDLDIRECT 169.0 01/09/2020   LDLCALC 146 (H) 06/09/2019   ALT 16 01/09/2020   AST 15 01/09/2020   NA 135 01/09/2020   K 3.5 01/09/2020   CL 95 (L) 01/09/2020   CREATININE 0.81 01/09/2020   BUN 16 01/09/2020   CO2 31 01/09/2020   TSH 2.04 06/09/2019   HGBA1C 5.9 01/09/2020    Lab Results  Component Value Date   TSH 2.04 06/09/2019   Lab Results  Component Value Date   WBC 9.4 01/09/2020   HGB 12.1 01/09/2020   HCT 37.0 01/09/2020   MCV 80.0 01/09/2020   PLT 365.0 01/09/2020   Lab Results  Component Value Date   NA 135 01/09/2020   K 3.5 01/09/2020   CO2 31 01/09/2020   GLUCOSE 131 (H) 01/09/2020   BUN 16 01/09/2020   CREATININE 0.81 01/09/2020   BILITOT 0.5 01/09/2020   ALKPHOS 64 01/09/2020   AST 15 01/09/2020   ALT 16 01/09/2020   PROT 7.1 01/09/2020   ALBUMIN 4.4 01/09/2020   CALCIUM 9.4 01/09/2020   GFR 76.85 01/09/2020   Lab Results  Component Value Date   CHOL 259 (H) 01/09/2020   Lab Results  Component Value Date   HDL 52.40 01/09/2020  Lab Results  Component Value Date   LDLCALC 146 (H) 06/09/2019   Lab Results  Component Value Date   TRIG 205.0 (H) 01/09/2020   Lab Results  Component Value Date   CHOLHDL 5 01/09/2020   Lab Results  Component Value Date   HGBA1C 5.9 01/09/2020       Assessment & Plan:   Problem List Items Addressed This Visit    Polycystic ovarian syndrome    She notes with change from regular to XR form of Metformin she feels much better. Less diarrhea, hair loss, chin hair.      Hyperglycemia    hgba1c acceptable, minimize simple carbs. Increase exercise as tolerated. Continue current meds      Obesity    Has tolerated Saxenda at 0.6 daily. Will increase to 1.2 per day x 1 week then  increase to 1.8 daily and hold til next visit.       Relevant Medications   Liraglutide -Weight Management (SAXENDA) 18 MG/3ML SOPN   Secondary hypertension    Well controlled, no changes to meds. Encouraged heart healthy diet such as the DASH diet and exercise as tolerated.          I have changed Newt Lukes. Ehrmann "Veronica Stokes"'s Saxenda. I am also having her maintain her calcium carbonate, Pediatric Multiple Vitamins (FLINTSTONES MULTIVITAMIN PO), albuterol, cetirizine, Cyanocobalamin (B12 FAST DISSOLVE PO), Qsymia, albuterol, omeprazole, chlorthalidone, azelastine, acetic acid, metFORMIN, acetaminophen, traMADol, Pen Needles, FREESTYLE LITE, FreeStyle Freedom Lite, and freestyle.  Meds ordered this encounter  Medications  . Liraglutide -Weight Management (SAXENDA) 18 MG/3ML SOPN    Sig: Inject 0.2-0.3 mLs (1.2-1.8 mg total) into the skin daily. As directed and as tolerated    Dispense:  5 pen    Refill:  1    I discussed the assessment and treatment plan with the patient. The patient was provided an opportunity to ask questions and all were answered. The patient agreed with the plan and demonstrated an understanding of the instructions.   The patient was advised to call back or seek an in-person evaluation if the symptoms worsen or if the condition fails to improve as anticipated.  I provided 22 minutes of non-face-to-face time during this encounter.   Penni Homans, MD

## 2020-02-23 NOTE — Assessment & Plan Note (Signed)
She notes with change from regular to XR form of Metformin she feels much better. Less diarrhea, hair loss, chin hair.

## 2020-02-24 ENCOUNTER — Telehealth: Payer: BC Managed Care – PPO | Admitting: Family Medicine

## 2020-03-20 ENCOUNTER — Encounter: Payer: Self-pay | Admitting: Family Medicine

## 2020-04-02 ENCOUNTER — Encounter: Payer: Self-pay | Admitting: *Deleted

## 2020-05-10 ENCOUNTER — Encounter: Payer: Self-pay | Admitting: Family Medicine

## 2020-05-11 NOTE — Addendum Note (Signed)
Addended by: Kole Hilyard A on: 05/11/2020 11:02 AM   Modules accepted: Orders  

## 2020-05-17 ENCOUNTER — Other Ambulatory Visit (INDEPENDENT_AMBULATORY_CARE_PROVIDER_SITE_OTHER): Payer: BC Managed Care – PPO

## 2020-05-17 ENCOUNTER — Telehealth: Payer: Self-pay | Admitting: *Deleted

## 2020-05-17 ENCOUNTER — Other Ambulatory Visit: Payer: Self-pay

## 2020-05-17 ENCOUNTER — Other Ambulatory Visit: Payer: Self-pay | Admitting: Family Medicine

## 2020-05-17 DIAGNOSIS — I495 Sick sinus syndrome: Secondary | ICD-10-CM

## 2020-05-17 DIAGNOSIS — E782 Mixed hyperlipidemia: Secondary | ICD-10-CM

## 2020-05-17 DIAGNOSIS — R739 Hyperglycemia, unspecified: Secondary | ICD-10-CM | POA: Diagnosis not present

## 2020-05-17 DIAGNOSIS — R252 Cramp and spasm: Secondary | ICD-10-CM

## 2020-05-17 DIAGNOSIS — I159 Secondary hypertension, unspecified: Secondary | ICD-10-CM

## 2020-05-17 NOTE — Telephone Encounter (Signed)
Pt had lab visit today but no future orders were in Epic.  Please place appropriate orders.

## 2020-05-17 NOTE — Telephone Encounter (Signed)
We drew her already.  No lab orders.  I know to order general labs but I was not sure if you wanted to order the random insulin again or a1c?

## 2020-05-17 NOTE — Telephone Encounter (Signed)
I ordered the labs TY

## 2020-05-17 NOTE — Addendum Note (Signed)
Addended by: Mervin Kung A on: 05/17/2020 02:33 PM   Modules accepted: Orders

## 2020-05-18 LAB — COMPREHENSIVE METABOLIC PANEL
AG Ratio: 1.7 (calc) (ref 1.0–2.5)
ALT: 16 U/L (ref 6–29)
AST: 17 U/L (ref 10–30)
Albumin: 4.1 g/dL (ref 3.6–5.1)
Alkaline phosphatase (APISO): 57 U/L (ref 31–125)
BUN: 15 mg/dL (ref 7–25)
CO2: 29 mmol/L (ref 20–32)
Calcium: 9.3 mg/dL (ref 8.6–10.2)
Chloride: 99 mmol/L (ref 98–110)
Creat: 0.76 mg/dL (ref 0.50–1.10)
Globulin: 2.4 g/dL (calc) (ref 1.9–3.7)
Glucose, Bld: 92 mg/dL (ref 65–99)
Potassium: 3.7 mmol/L (ref 3.5–5.3)
Sodium: 139 mmol/L (ref 135–146)
Total Bilirubin: 0.4 mg/dL (ref 0.2–1.2)
Total Protein: 6.5 g/dL (ref 6.1–8.1)

## 2020-05-18 LAB — CBC
HCT: 37.6 % (ref 35.0–45.0)
Hemoglobin: 12 g/dL (ref 11.7–15.5)
MCH: 25.3 pg — ABNORMAL LOW (ref 27.0–33.0)
MCHC: 31.9 g/dL — ABNORMAL LOW (ref 32.0–36.0)
MCV: 79.3 fL — ABNORMAL LOW (ref 80.0–100.0)
MPV: 11 fL (ref 7.5–12.5)
Platelets: 357 10*3/uL (ref 140–400)
RBC: 4.74 10*6/uL (ref 3.80–5.10)
RDW: 13.7 % (ref 11.0–15.0)
WBC: 7.5 10*3/uL (ref 3.8–10.8)

## 2020-05-18 LAB — LIPID PANEL
Cholesterol: 232 mg/dL — ABNORMAL HIGH (ref <200)
HDL: 64 mg/dL (ref >=50)
LDL Cholesterol (Calc): 144 mg/dL (calc) — ABNORMAL HIGH
Non-HDL Cholesterol (Calc): 168 mg/dL (calc) — ABNORMAL HIGH (ref <130)
Total CHOL/HDL Ratio: 3.6 (calc) (ref <5.0)
Triglycerides: 120 mg/dL (ref <150)

## 2020-05-18 LAB — HEMOGLOBIN A1C
Hgb A1c MFr Bld: 5.7 % of total Hgb — ABNORMAL HIGH (ref <5.7)
Mean Plasma Glucose: 117 (calc)
eAG (mmol/L): 6.5 (calc)

## 2020-05-18 LAB — TSH: TSH: 2.76 mIU/L

## 2020-05-18 LAB — MAGNESIUM: Magnesium: 1.9 mg/dL (ref 1.5–2.5)

## 2020-05-18 LAB — INSULIN, RANDOM: Insulin: 12.9 u[IU]/mL

## 2020-05-21 ENCOUNTER — Telehealth: Payer: Self-pay | Admitting: Family Medicine

## 2020-05-21 NOTE — Telephone Encounter (Signed)
Caller Annell Canty  Call Back # [phone]  Patient requesting a call back at number list above, this is her work number.

## 2020-06-14 ENCOUNTER — Other Ambulatory Visit: Payer: Self-pay

## 2020-06-14 ENCOUNTER — Ambulatory Visit (INDEPENDENT_AMBULATORY_CARE_PROVIDER_SITE_OTHER): Payer: BC Managed Care – PPO | Admitting: Family Medicine

## 2020-06-14 ENCOUNTER — Encounter: Payer: Self-pay | Admitting: Family Medicine

## 2020-06-14 DIAGNOSIS — E782 Mixed hyperlipidemia: Secondary | ICD-10-CM | POA: Diagnosis not present

## 2020-06-14 DIAGNOSIS — Z0000 Encounter for general adult medical examination without abnormal findings: Secondary | ICD-10-CM | POA: Diagnosis not present

## 2020-06-14 DIAGNOSIS — R739 Hyperglycemia, unspecified: Secondary | ICD-10-CM

## 2020-06-14 DIAGNOSIS — E6609 Other obesity due to excess calories: Secondary | ICD-10-CM

## 2020-06-14 DIAGNOSIS — F988 Other specified behavioral and emotional disorders with onset usually occurring in childhood and adolescence: Secondary | ICD-10-CM

## 2020-06-14 NOTE — Assessment & Plan Note (Signed)
Doing well on current meds, no changes 

## 2020-06-14 NOTE — Assessment & Plan Note (Signed)
Encouraged heart healthy diet, increase exercise, avoid trans fats, consider a krill oil cap daily 

## 2020-06-14 NOTE — Assessment & Plan Note (Signed)
hgba1c acceptable, minimize simple carbs. Increase exercise as tolerated.  

## 2020-06-14 NOTE — Progress Notes (Signed)
Subjective:    Patient ID: Veronica Stokes, female    DOB: Apr 25, 1976, 44 y.o.   MRN: [id number]  Chief Complaint  Patient presents with  . Annual Exam    HPI Patient is in today for annual preventative exam and follow up on chronic medical concerns. No recent febrile illness or hospitalizations. 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 She has been working 70 hours a week. No other acute concerns. She is tolerating Saxenda and metformin. Denies CP/palp/SOB/HA/congestion/fevers/GI or GU c/o. Taking meds as prescribed  Past Medical History:  Diagnosis Date  . Allergic state 12/18/2008   Qualifier: Diagnosis of  By: Birdie Riddle MD, Belenda Cruise    . Asthma    cough variant  . Chicken pox as a child  . Hyperglycemia 10/28/2015  . Hyperlipidemia, mixed 10/28/2015  . Insomnia 09/09/2016  . Obesity 09/09/2016  . Polycystic ovarian syndrome 8-12  . Preventative health care 10/28/2015    Past Surgical History:  Procedure Laterality Date  . KNEE SURGERY  12-94, 10-07   X 2 left  . WISDOM TOOTH EXTRACTION  2001    Family History  Problem Relation Age of Onset  . Diabetes Mother        type 2  . Hypertension Mother   . Diabetes Father        type 2- off of everything since gastric bypass  . Mental illness Sister        bipolar, ADHD  . Mental illness Maternal Grandmother        manic and bipolar  . Stroke Maternal Grandmother   . Alcohol abuse Maternal Grandmother   . Hearing loss Maternal Grandmother   . Heart disease Maternal Grandfather   . Heart attack Maternal Grandfather   . Diabetes Paternal Grandmother        type 2  . Stroke Paternal Grandmother   . Thyroid disease Paternal Grandmother   . Stroke Paternal Grandfather   . Heart attack Paternal Grandfather   . Cancer Maternal Aunt 50       breast  . Cancer Maternal Aunt 63       breast     Social History   Socioeconomic History  . Marital status: Married     Spouse name: Not on file  . Number of children: Not on file  . Years of education: Not on file  . Highest education level: Not on file  Occupational History  . Not on file  Tobacco Use  . Smoking status: Never Smoker  . Smokeless tobacco: Never Used  Vaping Use  . Vaping Use: Never used  Substance and Sexual Activity  . Alcohol use: No  . Drug use: No  . 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 Not on file  Social History Narrative  . Not on file   Social Determinants of Health   Financial Resource Strain:   . Difficulty of Paying Living Expenses: Not on file  Food Insecurity:   . Worried About Charity fundraiser in the Last Year: Not on file  . Ran Out of Food in the Last Year: Not on file  Transportation Needs:   . Lack of Transportation (Medical): Not on file  . Lack of Transportation (Non-Medical): Not on file  Physical Activity:   .  Days of Exercise per Week: Not on file  . Minutes of Exercise per Session: Not on file  Stress:   . Feeling of Stress : Not on file  Social Connections:   . Frequency of Communication with Friends and Family: Not on file  . Frequency of Social Gatherings with Friends and Family: Not on file  . Attends Religious Services: Not on file  . Active Member of Clubs or Organizations: Not on file  . Attends Archivist Meetings: Not on file  . Marital Status: Not on file  Intimate Partner Violence:   . Fear of Current or Ex-Partner: Not on file  . Emotionally Abused: Not on file  . Physically Abused: Not on file  . Sexually Abused: Not on file    Outpatient Medications Prior to Visit  Medication Sig Dispense Refill  . acetaminophen (TYLENOL) 500 MG tablet Take 500 mg by mouth every 6 (six) hours as needed.    [name] acetic acid 2 % otic solution Place 4 drops into both ears 3 (three) times daily. 15 mL 0  . albuterol (PROVENTIL)  (2.5 MG/3ML) 0.083% nebulizer solution Take 3 mLs (2.5 mg total) by nebulization every 4 (four) hours as needed for wheezing or shortness of breath. 75 mL 1  . albuterol (VENTOLIN HFA) 108 (90 Base) MCG/ACT inhaler Inhale 2 puffs into the lungs every 6 (six) hours as needed for wheezing. 18 g 1  . azelastine (ASTELIN) 0.1 % nasal spray Place 2 sprays into both nostrils 2 (two) times daily. Use in each nostril as directed 30 mL 5  . Blood Glucose Monitoring Suppl (FREESTYLE FREEDOM LITE) w/Device KIT Use to check sugar once a day.  Dx Code: E16.2 1 kit 0  . calcium carbonate (OS-CAL) 600 MG TABS Take 600 mg by mouth daily.    . cetirizine (ZYRTEC ALLERGY) 10 MG tablet Take 10 mg by mouth daily.    . chlorthalidone (HYGROTON) 25 MG tablet TAKE 1 TABLET(25 MG) BY MOUTH DAILY 90 tablet 1  . Cyanocobalamin (B12 FAST DISSOLVE PO) Take by mouth.    [name] glucose blood (FREESTYLE LITE) test strip Use to check sugar once a day.  Dx Code: E16.2 50 each 1  . Insulin Pen Needle (PEN NEEDLES) 32G X 4 MM MISC Use with Saxenda once daily 100 each 0  . Lancets (FREESTYLE) lancets Use to check sugar once a day.  Dx Code: E16.2 100 each 1  . Liraglutide -Weight Management (SAXENDA) 18 MG/3ML SOPN Inject 0.2-0.3 mLs (1.2-1.8 mg total) into the skin daily. As directed and as tolerated 5 pen 1  . metFORMIN (GLUCOPHAGE-XR) 500 MG 24 hr tablet Take 1 tablet (500 mg total) by mouth daily with breakfast. 90 tablet 1  . Pediatric Multiple Vitamins (FLINTSTONES MULTIVITAMIN PO) Take 2 tablets by mouth.    [name] omeprazole (PRILOSEC) 20 MG capsule Take 1 capsule (20 mg total) by mouth daily. 30 capsule 11  . QSYMIA 7.5-46 MG CP24 TK 1 C PO QD    . traMADol (ULTRAM) 50 MG tablet Take 1 tablet (50 mg total) by mouth every 6 (six) hours as needed. 15 tablet 0   No facility-administered medications prior to visit.    Allergies  Allergen Reactions  . Corticosteroids Anaphylaxis  . Eggs Or Egg-Derived Products   . Latex   .  Metformin And Related Nausea And Vomiting  . Penicillins     REACTION: SWELLING  . Sulfa Antibiotics Itching    Review of Systems  Constitutional: Negative for chills, fever and malaise/fatigue.  HENT: Negative for congestion and hearing loss.   Eyes: Negative for discharge.  Respiratory: Negative for cough, sputum production and shortness of breath.   Cardiovascular: Negative for chest pain, palpitations and leg swelling.  Gastrointestinal: Negative for abdominal pain, blood in stool, constipation, diarrhea, heartburn, nausea and vomiting.  Genitourinary: Negative for dysuria, frequency, hematuria and urgency.  Musculoskeletal: Negative for back pain, falls and myalgias.  Skin: Negative for rash.  Neurological: Negative for dizziness, sensory change, loss of consciousness, weakness and headaches.  Endo/Heme/Allergies: Negative for environmental allergies. Does not bruise/bleed easily.  Psychiatric/Behavioral: Negative for depression and suicidal ideas. The patient is not nervous/anxious and does not have insomnia.        Objective:    Physical Exam Constitutional:      General: She is not in acute distress.    Appearance: She is well-developed.  HENT:     Head: Normocephalic and atraumatic.  Eyes:     Conjunctiva/sclera: Conjunctivae normal.  Neck:     Thyroid: No thyromegaly.  Cardiovascular:     Rate and Rhythm: Normal rate and regular rhythm.     Heart sounds: Normal heart sounds. No murmur heard.   Pulmonary:     Effort: Pulmonary effort is normal. No respiratory distress.     Breath sounds: Normal breath sounds.  Abdominal:     General: Bowel sounds are normal. There is no distension.     Palpations: Abdomen is soft. There is no mass.     Tenderness: There is no abdominal tenderness.  Musculoskeletal:     Cervical back: Neck supple.  Lymphadenopathy:     Cervical: No cervical adenopathy.  Skin:    General: Skin is warm and dry.  Neurological:     Mental  Status: She is alert and oriented to person, place, and time.  Psychiatric:        Behavior: Behavior normal.     BP 112/64 (BP Location: Left Arm, Patient Position: Sitting, Cuff Size: Small)   Pulse 98   Temp 97.8 F (36.6 C) (Oral)   Resp 13   Ht 4' 11"  (1.499 m)   Wt 180 lb (81.6 kg)   LMP 05/27/2020 (Approximate)   SpO2 99%   BMI 36.36 kg/m  Wt Readings from Last 3 Encounters:  06/14/20 180 lb (81.6 kg)  01/17/20 170 lb (77.1 kg)  01/09/20 180 lb 3.2 oz (81.7 kg)    Diabetic Foot Exam - Simple   No data filed     Lab Results  Component Value Date   WBC 7.5 05/17/2020   HGB 12.0 05/17/2020   HCT 37.6 05/17/2020   PLT 357 05/17/2020   GLUCOSE 92 05/17/2020   CHOL 232 (H) 05/17/2020   TRIG 120 05/17/2020   HDL 64 05/17/2020   LDLDIRECT 169.0 01/09/2020   LDLCALC 144 (H) 05/17/2020   ALT 16 05/17/2020   AST 17 05/17/2020   NA 139 05/17/2020   K 3.7 05/17/2020   CL 99 05/17/2020   CREATININE 0.76 05/17/2020   BUN 15 05/17/2020   CO2 29 05/17/2020   TSH 2.76 05/17/2020   HGBA1C 5.7 (H) 05/17/2020    Lab Results  Component Value Date   TSH 2.76 05/17/2020   Lab Results  Component Value Date   WBC 7.5 05/17/2020   HGB 12.0 05/17/2020   HCT 37.6 05/17/2020   MCV 79.3 (L) 05/17/2020   PLT 357 05/17/2020   Lab Results  Component Value  Date   NA 139 05/17/2020   K 3.7 05/17/2020   CO2 29 05/17/2020   GLUCOSE 92 05/17/2020   BUN 15 05/17/2020   CREATININE 0.76 05/17/2020   BILITOT 0.4 05/17/2020   ALKPHOS 64 01/09/2020   AST 17 05/17/2020   ALT 16 05/17/2020   PROT 6.5 05/17/2020   ALBUMIN 4.4 01/09/2020   CALCIUM 9.3 05/17/2020   GFR 76.85 01/09/2020   Lab Results  Component Value Date   CHOL 232 (H) 05/17/2020   Lab Results  Component Value Date   HDL 64 05/17/2020   Lab Results  Component Value Date   LDLCALC 144 (H) 05/17/2020   Lab Results  Component Value Date   TRIG 120 05/17/2020   Lab Results  Component Value Date    CHOLHDL 3.6 05/17/2020   Lab Results  Component Value Date   HGBA1C 5.7 (H) 05/17/2020       Assessment & Plan:   Problem List Items Addressed This Visit    Attention deficit disorder    Doing well on current meds, no changes      Preventative health care    Patient encouraged to maintain heart healthy diet, regular exercise, adequate sleep. Consider daily probiotics. Take medications as prescribed      Hyperlipidemia, mixed    Encouraged heart healthy diet, increase exercise, avoid trans fats, consider a krill oil cap daily      Hyperglycemia    hgba1c acceptable, minimize simple carbs. Increase exercise as tolerated.       Obesity    Encouraged DASH diet, decrease po intake and increase exercise as tolerated. Needs 7-8 hours of sleep nightly. Avoid trans fats, eat small, frequent meals every 4-5 hours with lean proteins, complex carbs and healthy fats. Minimize simple carbs. Tolerating Saxenda no changes         I am having Newt Lukes. Fluellen "Kathlee Nations" maintain her calcium carbonate, Pediatric Multiple Vitamins (FLINTSTONES MULTIVITAMIN PO), albuterol, cetirizine, Cyanocobalamin (B12 FAST DISSOLVE PO), Qsymia, albuterol, omeprazole, chlorthalidone, azelastine, acetic acid, metFORMIN, acetaminophen, traMADol, Pen Needles, FREESTYLE LITE, FreeStyle Freedom Lite, freestyle, and Saxenda.  No orders of the defined types were placed in this encounter.    Penni Homans, MD

## 2020-06-14 NOTE — Patient Instructions (Signed)
Preventive Care 21-44 Years Old, Female Preventive care refers to visits with your health care provider and lifestyle choices that can promote health and wellness. This includes:  A yearly physical exam. This may also be called an annual well check.  Regular dental visits and eye exams.  Immunizations.  Screening for certain conditions.  Healthy lifestyle choices, such as eating a healthy diet, getting regular exercise, not using drugs or products that contain nicotine and tobacco, and limiting alcohol use. What can I expect for my preventive care visit? Physical exam Your health care provider will check your:  Height and weight. This may be used to calculate body mass index (BMI), which tells if you are at a healthy weight.  Heart rate and blood pressure.  Skin for abnormal spots. Counseling Your health care provider may ask you questions about your:  Alcohol, tobacco, and drug use.  Emotional well-being.  Home and relationship well-being.  Sexual activity.  Eating habits.  Work and work environment.  Method of birth control.  Menstrual cycle.  Pregnancy history. What immunizations do I need?  Influenza (flu) vaccine  This is recommended every year. Tetanus, diphtheria, and pertussis (Tdap) vaccine  You may need a Td booster every 10 years. Varicella (chickenpox) vaccine  You may need this if you have not been vaccinated. Human papillomavirus (HPV) vaccine  If recommended by your health care provider, you may need three doses over 6 months. Measles, mumps, and rubella (MMR) vaccine  You may need at least one dose of MMR. You may also need a second dose. Meningococcal conjugate (MenACWY) vaccine  One dose is recommended if you are age 19-21 years and a first-year college student living in a residence hall, or if you have one of several medical conditions. You may also need additional booster doses. Pneumococcal conjugate (PCV13) vaccine  You may need  this if you have certain conditions and were not previously vaccinated. Pneumococcal polysaccharide (PPSV23) vaccine  You may need one or two doses if you smoke cigarettes or if you have certain conditions. Hepatitis A vaccine  You may need this if you have certain conditions or if you travel or work in places where you may be exposed to hepatitis A. Hepatitis B vaccine  You may need this if you have certain conditions or if you travel or work in places where you may be exposed to hepatitis B. Haemophilus influenzae type b (Hib) vaccine  You may need this if you have certain conditions. You may receive vaccines as individual doses or as more than one vaccine together in one shot (combination vaccines). Talk with your health care provider about the risks and benefits of combination vaccines. What tests do I need?  Blood tests  Lipid and cholesterol levels. These may be checked every 5 years starting at age 20.  Hepatitis C test.  Hepatitis B test. Screening  Diabetes screening. This is done by checking your blood sugar (glucose) after you have not eaten for a while (fasting).  Sexually transmitted disease (STD) testing.  BRCA-related cancer screening. This may be done if you have a family history of breast, ovarian, tubal, or peritoneal cancers.  Pelvic exam and Pap test. This may be done every 3 years starting at age 21. Starting at age 30, this may be done every 5 years if you have a Pap test in combination with an HPV test. Talk with your health care provider about your test results, treatment options, and if necessary, the need for more tests.   Follow these instructions at home: Eating and drinking   Eat a diet that includes fresh fruits and vegetables, whole grains, lean protein, and low-fat dairy.  Take vitamin and mineral supplements as recommended by your health care provider.  Do not drink alcohol if: ? Your health care provider tells you not to drink. ? You are  pregnant, may be pregnant, or are planning to become pregnant.  If you drink alcohol: ? Limit how much you have to 0-1 drink a day. ? Be aware of how much alcohol is in your drink. In the [place] one drink equals one 12 oz bottle of beer (355 mL), one 5 oz glass of wine (148 mL), or one 1 oz glass of hard liquor (44 mL). Lifestyle  Take daily care of your teeth and gums.  Stay active. Exercise for at least 30 minutes on 5 or more days each week.  Do not use any products that contain nicotine or tobacco, such as cigarettes, e-cigarettes, and chewing tobacco. If you need help quitting, ask your health care provider.  If you are sexually active, practice safe sex. Use a condom or other form of birth control (contraception) in order to prevent pregnancy and STIs (sexually transmitted infections). If you plan to become pregnant, see your health care provider for a preconception visit. What's next?  Visit your health care provider once a year for a well check visit.  Ask your health care provider how often you should have your eyes and teeth checked.  Stay up to date on all vaccines. This information is not intended to replace advice given to you by your health care provider. Make sure you discuss any questions you have with your health care provider. Document Revised: 05/06/2018 Document Reviewed: 05/06/2018 Elsevier Patient Education  2020 [employer].

## 2020-06-14 NOTE — Assessment & Plan Note (Signed)
Patient encouraged to maintain heart healthy diet, regular exercise, adequate sleep. Consider daily probiotics. Take medications as prescribed 

## 2020-06-14 NOTE — Assessment & Plan Note (Addendum)
Encouraged DASH diet, decrease po intake and increase exercise as tolerated. Needs 7-8 hours of sleep nightly. Avoid trans fats, eat small, frequent meals every 4-5 hours with lean proteins, complex carbs and healthy fats. Minimize simple carbs. Tolerating Saxenda no changes

## 2020-06-18 ENCOUNTER — Telehealth: Payer: Self-pay

## 2020-06-18 NOTE — Telephone Encounter (Signed)
PA initiated via Covermymeds; KEY: [id number]. Awaiting determination.

## 2020-06-21 NOTE — Telephone Encounter (Signed)
PA denied. Pt has not lost and maintained required 4% weight loss.

## 2020-06-22 NOTE — Telephone Encounter (Signed)
Well great. Not sure why I got the note about it then but sounds like she is all set

## 2020-06-22 NOTE — Telephone Encounter (Signed)
Pt reports she has no insurance associated with this medication. Pt uses a card from medication  manufacture that makes medication affordable for out of pocket pay.

## 2020-06-22 NOTE — Telephone Encounter (Signed)
Notify patient Veronica Stokes denied her insurance requires she loose 4% of her body weight before they will cover it. She is welcome to talk to her insurance company and ask them what they are willing to pay for instead.

## 2020-06-22 NOTE — Telephone Encounter (Signed)
Saxenda was denied.

## 2020-07-10 ENCOUNTER — Telehealth: Payer: Self-pay | Admitting: Family Medicine

## 2020-07-10 NOTE — Telephone Encounter (Signed)
Paperwork in provider box awaiting a signature

## 2020-07-10 NOTE — Telephone Encounter (Signed)
Patient dropped off forms to be filled out  Health exam forms  Would like to be called when it is ready to be picked up ([phone])  Put into [employer] bin up front

## 2020-07-17 ENCOUNTER — Encounter: Payer: Self-pay | Admitting: Family Medicine

## 2020-07-17 NOTE — Telephone Encounter (Signed)
I made pt aware that we don't history of her vaccines.  I looked in the [place] for history of vaccines but there were not in there.She will need titters for to see if she has immunity. She ask if we could e-mail her the form she dropped and I did.

## 2020-07-25 ENCOUNTER — Telehealth: Payer: Self-pay

## 2020-07-25 NOTE — Telephone Encounter (Signed)
I spoke with pt last week and told her that she would have to come in for titers to see if she had immunity. Pt aware

## 2020-10-05 ENCOUNTER — Encounter: Payer: Self-pay | Admitting: Family Medicine

## 2020-10-09 NOTE — Telephone Encounter (Signed)
Spoke with patient yesterday and had put a hold on the 2pm and 2:20pm appt times for 2/1 just in case she could find a substitute and be able to come in to see PCP to have her appt.  Pt called me this morning and advised she could not find a sub in such short notice.  Dr. Abner Greenspan, I have looked over your schedule for the next few weeks and am just not sure where you would like me to schedule the pt.  Pt stated the paperwork for the foster agency MUST be received by them on 11/05/20.  Please advise where to schedule pt and I will call her back with appt date and time.

## 2020-10-18 ENCOUNTER — Encounter: Payer: BC Managed Care – PPO | Admitting: Family Medicine

## 2020-11-01 ENCOUNTER — Ambulatory Visit (INDEPENDENT_AMBULATORY_CARE_PROVIDER_SITE_OTHER): Payer: BC Managed Care – PPO | Admitting: Family Medicine

## 2020-11-01 ENCOUNTER — Other Ambulatory Visit: Payer: Self-pay

## 2020-11-01 ENCOUNTER — Encounter: Payer: Self-pay | Admitting: Family Medicine

## 2020-11-01 VITALS — BP 114/72 | HR 99 | Temp 98.8°F | Resp 16 | Ht 59.0 in | Wt 188.6 lb

## 2020-11-01 DIAGNOSIS — E6609 Other obesity due to excess calories: Secondary | ICD-10-CM

## 2020-11-01 DIAGNOSIS — E782 Mixed hyperlipidemia: Secondary | ICD-10-CM

## 2020-11-01 DIAGNOSIS — R030 Elevated blood-pressure reading, without diagnosis of hypertension: Secondary | ICD-10-CM | POA: Diagnosis not present

## 2020-11-01 DIAGNOSIS — R739 Hyperglycemia, unspecified: Secondary | ICD-10-CM | POA: Diagnosis not present

## 2020-11-01 DIAGNOSIS — R252 Cramp and spasm: Secondary | ICD-10-CM

## 2020-11-01 DIAGNOSIS — Z0000 Encounter for general adult medical examination without abnormal findings: Secondary | ICD-10-CM

## 2020-11-01 DIAGNOSIS — I159 Secondary hypertension, unspecified: Secondary | ICD-10-CM

## 2020-11-01 MED ORDER — WEGOVY 0.25 MG/0.5ML ~~LOC~~ SOAJ: SUBCUTANEOUS | 1 refills | Status: DC

## 2020-11-01 MED ORDER — WEGOVY 1 MG/0.5ML ~~LOC~~ SOAJ: 1.0000 mg | SUBCUTANEOUS | 0 refills | Status: DC

## 2020-11-01 NOTE — Assessment & Plan Note (Signed)
Patient encouraged to maintain heart healthy diet, regular exercise, adequate sleep. Consider daily probiotics. Take medications as prescribed. MGM was March 2021, repeat annually. Likely needs colonoscopy next year.

## 2020-11-01 NOTE — Assessment & Plan Note (Signed)
hgba1c acceptable, minimize simple carbs. Increase exercise as tolerated. Continue current meds 

## 2020-11-01 NOTE — Patient Instructions (Signed)
Preventive Care 21-45 Years Old, Female Preventive care refers to lifestyle choices and visits with your health care provider that can promote health and wellness. This includes:  A yearly physical exam. This is also called an annual wellness visit.  Regular dental and eye exams.  Immunizations.  Screening for certain conditions.  Healthy lifestyle choices, such as: ? Eating a healthy diet. ? Getting regular exercise. ? Not using drugs or products that contain nicotine and tobacco. ? Limiting alcohol use. What can I expect for my preventive care visit? Physical exam Your health care provider may check your:  Height and weight. These may be used to calculate your BMI (body mass index). BMI is a measurement that tells if you are at a healthy weight.  Heart rate and blood pressure.  Body temperature.  Skin for abnormal spots. Counseling Your health care provider may ask you questions about your:  Past medical problems.  Family's medical history.  Alcohol, tobacco, and drug use.  Emotional well-being.  Home life and relationship well-being.  Sexual activity.  Diet, exercise, and sleep habits.  Work and work environment.  Access to firearms.  Method of birth control.  Menstrual cycle.  Pregnancy history. What immunizations do I need? Vaccines are usually given at various ages, according to a schedule. Your health care provider will recommend vaccines for you based on your age, medical history, and lifestyle or other factors, such as travel or where you work.   What tests do I need? Blood tests  Lipid and cholesterol levels. These may be checked every 5 years starting at age 20.  Hepatitis C test.  Hepatitis B test. Screening  Diabetes screening. This is done by checking your blood sugar (glucose) after you have not eaten for a while (fasting).  STD (sexually transmitted disease) testing, if you are at risk.  BRCA-related cancer screening. This may be  done if you have a family history of breast, ovarian, tubal, or peritoneal cancers.  Pelvic exam and Pap test. This may be done every 3 years starting at age 21. Starting at age 30, this may be done every 5 years if you have a Pap test in combination with an HPV test. Talk with your health care provider about your test results, treatment options, and if necessary, the need for more tests.   Follow these instructions at home: Eating and drinking  Eat a healthy diet that includes fresh fruits and vegetables, whole grains, lean protein, and low-fat dairy products.  Take vitamin and mineral supplements as recommended by your health care provider.  Do not drink alcohol if: ? Your health care provider tells you not to drink. ? You are pregnant, may be pregnant, or are planning to become pregnant.  If you drink alcohol: ? Limit how much you have to 0-1 drink a day. ? Be aware of how much alcohol is in your drink. In the [place] one drink equals one 12 oz bottle of beer (355 mL), one 5 oz glass of wine (148 mL), or one 1 oz glass of hard liquor (44 mL).   Lifestyle  Take daily care of your teeth and gums. Brush your teeth every morning and night with fluoride toothpaste. Floss one time each day.  Stay active. Exercise for at least 30 minutes 5 or more days each week.  Do not use any products that contain nicotine or tobacco, such as cigarettes, e-cigarettes, and chewing tobacco. If you need help quitting, ask your health care provider.  Do not   use drugs.  If you are sexually active, practice safe sex. Use a condom or other form of protection to prevent STIs (sexually transmitted infections).  If you do not wish to become pregnant, use a form of birth control. If you plan to become pregnant, see your health care provider for a prepregnancy visit.  Find healthy ways to cope with stress, such as: ? Meditation, yoga, or listening to music. ? Journaling. ? Talking to a trusted  person. ? Spending time with friends and family. Safety  Always wear your seat belt while driving or riding in a vehicle.  Do not drive: ? If you have been drinking alcohol. Do not ride with someone who has been drinking. ? When you are tired or distracted. ? While texting.  Wear a helmet and other protective equipment during sports activities.  If you have firearms in your house, make sure you follow all gun safety procedures.  Seek help if you have been physically or sexually abused. What's next?  Go to your health care provider once a year for an annual wellness visit.  Ask your health care provider how often you should have your eyes and teeth checked.  Stay up to date on all vaccines. This information is not intended to replace advice given to you by your health care provider. Make sure you discuss any questions you have with your health care provider. Document Revised: 04/22/2020 Document Reviewed: 05/06/2018 Elsevier Patient Education  2021 [employer]  

## 2020-11-02 ENCOUNTER — Encounter: Payer: Self-pay | Admitting: *Deleted

## 2020-11-02 ENCOUNTER — Telehealth: Payer: Self-pay | Admitting: *Deleted

## 2020-11-02 NOTE — Telephone Encounter (Signed)
Prior auth approved for the 0.25/0.5mg  from 11/02/2020 - 06/02/2021.  Cover my meds Key: [id number]  Sent a mychart message to patient to call her pharmacy for the 1mg  when she is ready and we will do the prior auth for that mg at that time.

## 2020-11-04 NOTE — Progress Notes (Signed)
Subjective:    Patient ID: Veronica Stokes, female    DOB: 13-Jul-1976, 45 y.o.   MRN: [id number]  Chief Complaint  Patient presents with  . Annual Exam    HPI Patient is in today for annual preventative exam and follow up on chronic medical concerns. No recent febrile illness or hospitalizations. 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 She is trying to stay active and maintain a heart healthy diet but is frustrated with her lack of recent weight loss. Denies CP/palp/SOB/HA/congestion/fevers/GI or GU c/o. Taking meds as prescribed  Past Medical History:  Diagnosis Date  . Allergic state 12/18/2008   Qualifier: Diagnosis of  By: Birdie Riddle MD, Belenda Cruise    . Asthma    cough variant  . Chicken pox as a child  . Hyperglycemia 10/28/2015  . Hyperlipidemia, mixed 10/28/2015  . Insomnia 09/09/2016  . Obesity 09/09/2016  . Polycystic ovarian syndrome 8-12  . Preventative health care 10/28/2015    Past Surgical History:  Procedure Laterality Date  . KNEE SURGERY  12-94, 10-07   X 2 left  . WISDOM TOOTH EXTRACTION  2001    Family History  Problem Relation Age of Onset  . Diabetes Mother        type 2  . Hypertension Mother   . Diabetes Father        type 2- off of everything since gastric bypass  . Mental illness Sister        bipolar, ADHD  . Mental illness Maternal Grandmother        manic and bipolar  . Stroke Maternal Grandmother   . Alcohol abuse Maternal Grandmother   . Hearing loss Maternal Grandmother   . Heart disease Maternal Grandfather   . Heart attack Maternal Grandfather   . Diabetes Paternal Grandmother        type 2  . Stroke Paternal Grandmother   . Thyroid disease Paternal Grandmother   . Stroke Paternal Grandfather   . Heart attack Paternal Grandfather   . Cancer Maternal Aunt 50       breast  . Cancer Maternal Aunt 39       breast     Social History   Socioeconomic History  . Marital status: Married    Spouse  name: Not on file  . Number of children: Not on file  . Years of education: Not on file  . Highest education level: Not on file  Occupational History  . Not on file  Tobacco Use  . Smoking status: Never Smoker  . Smokeless tobacco: Never Used  Vaping Use  . Vaping Use: Never used  Substance and Sexual Activity  . Alcohol use: No  . Drug use: No  . 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 Not on file  Social History Narrative  . Not on file   Social Determinants of Health   Financial Resource Strain: Not on file  Food Insecurity: Not on file  Transportation Needs: Not on file  Physical Activity: Not on file  Stress: Not on file  Social Connections: Not on file  Intimate Partner Violence: Not on file    Outpatient Medications Prior to Visit  Medication Sig Dispense Refill  . acetaminophen (TYLENOL) 500 MG tablet Take 500 mg by mouth every 6 (six) hours as needed.    [name]  acetic acid 2 % otic solution Place 4 drops into both ears 3 (three) times daily. 15 mL 0  . albuterol (PROVENTIL) (2.5 MG/3ML) 0.083% nebulizer solution Take 3 mLs (2.5 mg total) by nebulization every 4 (four) hours as needed for wheezing or shortness of breath. 75 mL 1  . albuterol (VENTOLIN HFA) 108 (90 Base) MCG/ACT inhaler Inhale 2 puffs into the lungs every 6 (six) hours as needed for wheezing. 18 g 1  . azelastine (ASTELIN) 0.1 % nasal spray Place 2 sprays into both nostrils 2 (two) times daily. Use in each nostril as directed 30 mL 5  . Blood Glucose Monitoring Suppl (FREESTYLE FREEDOM LITE) w/Device KIT Use to check sugar once a day.  Dx Code: E16.2 1 kit 0  . calcium carbonate (OS-CAL) 600 MG TABS Take 600 mg by mouth daily.    . cetirizine (ZYRTEC) 10 MG tablet Take 10 mg by mouth daily.    . chlorthalidone (HYGROTON) 25 MG tablet TAKE 1 TABLET(25 MG) BY MOUTH DAILY 90 tablet 1  . Cyanocobalamin  (B12 FAST DISSOLVE PO) Take by mouth.    [name] glucose blood (FREESTYLE LITE) test strip Use to check sugar once a day.  Dx Code: E16.2 50 each 1  . Insulin Pen Needle (PEN NEEDLES) 32G X 4 MM MISC Use with Saxenda once daily 100 each 0  . Lancets (FREESTYLE) lancets Use to check sugar once a day.  Dx Code: E16.2 100 each 1  . Liraglutide -Weight Management (SAXENDA) 18 MG/3ML SOPN Inject 0.2-0.3 mLs (1.2-1.8 mg total) into the skin daily. As directed and as tolerated 5 pen 1  . metFORMIN (GLUCOPHAGE-XR) 500 MG 24 hr tablet Take 1 tablet (500 mg total) by mouth daily with breakfast. 90 tablet 1  . Pediatric Multiple Vitamins (FLINTSTONES MULTIVITAMIN PO) Take 2 tablets by mouth.    [name] omeprazole (PRILOSEC) 20 MG capsule Take 1 capsule (20 mg total) by mouth daily. 30 capsule 11  . QSYMIA 7.5-46 MG CP24 TK 1 C PO QD    . traMADol (ULTRAM) 50 MG tablet Take 1 tablet (50 mg total) by mouth every 6 (six) hours as needed. 15 tablet 0   No facility-administered medications prior to visit.    Allergies  Allergen Reactions  . Corticosteroids Anaphylaxis  . Eggs Or Egg-Derived Products   . Latex   . Metformin And Related Nausea And Vomiting  . Penicillins     REACTION: SWELLING  . Sulfa Antibiotics Itching    Review of Systems  Constitutional: Negative for chills, fever and malaise/fatigue.  HENT: Negative for congestion and hearing loss.   Eyes: Negative for discharge.  Respiratory: Negative for cough, sputum production and shortness of breath.   Cardiovascular: Negative for chest pain, palpitations and leg swelling.  Gastrointestinal: Negative for abdominal pain, blood in stool, constipation, diarrhea, heartburn, nausea and vomiting.  Genitourinary: Negative for dysuria, frequency, hematuria and urgency.  Musculoskeletal: Negative for back pain, falls and myalgias.  Skin: Negative for rash.  Neurological: Negative for dizziness, sensory change, loss of consciousness, weakness and headaches.   Endo/Heme/Allergies: Negative for environmental allergies. Does not bruise/bleed easily.  Psychiatric/Behavioral: Negative for depression and suicidal ideas. The patient is not nervous/anxious and does not have insomnia.        Objective:    Physical Exam Constitutional:      General: She is not in acute distress.    Appearance: She is well-developed and well-nourished.  HENT:     Head: Normocephalic and atraumatic.  Eyes:     Conjunctiva/sclera: Conjunctivae normal.  Neck:     Thyroid: No thyromegaly.  Cardiovascular:     Rate and Rhythm: Normal rate and regular rhythm.     Heart sounds: Normal heart sounds. No murmur heard.   Pulmonary:     Effort: Pulmonary effort is normal. No respiratory distress.     Breath sounds: Normal breath sounds.  Abdominal:     General: Bowel sounds are normal. There is no distension.     Palpations: Abdomen is soft. There is no mass.     Tenderness: There is no abdominal tenderness.  Musculoskeletal:        General: No edema.     Cervical back: Neck supple.  Lymphadenopathy:     Cervical: No cervical adenopathy.  Skin:    General: Skin is warm and dry.  Neurological:     Mental Status: She is alert and oriented to person, place, and time.  Psychiatric:        Mood and Affect: Mood and affect normal.        Behavior: Behavior normal.     BP 114/72   Pulse 99   Temp 98.8 F (37.1 C)   Resp 16   Ht 4' 11"  (1.499 m)   Wt 188 lb 9.6 oz (85.5 kg)   SpO2 98%   BMI 38.09 kg/m  Wt Readings from Last 3 Encounters:  11/01/20 188 lb 9.6 oz (85.5 kg)  06/14/20 180 lb (81.6 kg)  01/17/20 170 lb (77.1 kg)    Diabetic Foot Exam - Simple   No data filed    Lab Results  Component Value Date   WBC 7.5 05/17/2020   HGB 12.0 05/17/2020   HCT 37.6 05/17/2020   PLT 357 05/17/2020   GLUCOSE 92 05/17/2020   CHOL 232 (H) 05/17/2020   TRIG 120 05/17/2020   HDL 64 05/17/2020   LDLDIRECT 169.0 01/09/2020   LDLCALC 144 (H) 05/17/2020    ALT 16 05/17/2020   AST 17 05/17/2020   NA 139 05/17/2020   K 3.7 05/17/2020   CL 99 05/17/2020   CREATININE 0.76 05/17/2020   BUN 15 05/17/2020   CO2 29 05/17/2020   TSH 2.76 05/17/2020   HGBA1C 5.7 (H) 05/17/2020    Lab Results  Component Value Date   TSH 2.76 05/17/2020   Lab Results  Component Value Date   WBC 7.5 05/17/2020   HGB 12.0 05/17/2020   HCT 37.6 05/17/2020   MCV 79.3 (L) 05/17/2020   PLT 357 05/17/2020   Lab Results  Component Value Date   NA 139 05/17/2020   K 3.7 05/17/2020   CO2 29 05/17/2020   GLUCOSE 92 05/17/2020   BUN 15 05/17/2020   CREATININE 0.76 05/17/2020   BILITOT 0.4 05/17/2020   ALKPHOS 64 01/09/2020   AST 17 05/17/2020   ALT 16 05/17/2020   PROT 6.5 05/17/2020   ALBUMIN 4.4 01/09/2020   CALCIUM 9.3 05/17/2020   GFR 76.85 01/09/2020   Lab Results  Component Value Date   CHOL 232 (H) 05/17/2020   Lab Results  Component Value Date   HDL 64 05/17/2020   Lab Results  Component Value Date   LDLCALC 144 (H) 05/17/2020   Lab Results  Component Value Date   TRIG 120 05/17/2020   Lab Results  Component Value Date   CHOLHDL 3.6 05/17/2020   Lab Results  Component Value Date   HGBA1C 5.7 (H) 05/17/2020  Assessment & Plan:   Problem List Items Addressed This Visit    ELEVATED BLOOD PRESSURE WITHOUT DIAGNOSIS OF HYPERTENSION   Relevant Orders   CBC   Comprehensive metabolic panel   TSH   Preventative health care    Patient encouraged to maintain heart healthy diet, regular exercise, adequate sleep. Consider daily probiotics. Take medications as prescribed. MGM was March 2021, repeat annually. Likely needs colonoscopy next year.       Hyperlipidemia, mixed    Encouraged heart healthy diet, increase exercise, avoid trans fats, consider a krill oil cap daily      Relevant Orders   Lipid panel   Hyperglycemia - Primary    hgba1c acceptable, minimize simple carbs. Increase exercise as tolerated. Continue current  meds      Relevant Orders   Hemoglobin A1c   Leg cramp   Obesity    Encouraged DASH diet, decrease po intake and increase exercise as tolerated. Needs 7-8 hours of sleep nightly. Avoid trans fats, eat small, frequent meals every 4-5 hours with lean proteins, complex carbs and healthy fats. Minimize simple carbs, started on Wegovy 0.25 mg SQ weekly x 4 weeks then 0.5 mg weekly x 4 weeks then 1 mg weekly x 4 weeks. Next dose will increase to 1.7 mg weekly x 4 weeks then 2.4 mg weekly as tolerated.       Relevant Medications   Semaglutide-Weight Management (WEGOVY) 0.25 MG/0.5ML SOAJ   Semaglutide-Weight Management (WEGOVY) 1 MG/0.5ML SOAJ   Secondary hypertension    Well controlled, no changes to meds. Encouraged heart healthy diet such as the DASH diet and exercise as tolerated.          I am having Veronica Stokes. Veronica "Kathlee Nations" start on [place] and Wegovy. I am also having her maintain her calcium carbonate, Pediatric Multiple Vitamins (FLINTSTONES MULTIVITAMIN PO), albuterol, cetirizine, Cyanocobalamin (B12 FAST DISSOLVE PO), Qsymia, albuterol, omeprazole, chlorthalidone, azelastine, acetic acid, metFORMIN, acetaminophen, traMADol, Pen Needles, FREESTYLE LITE, FreeStyle Freedom Lite, freestyle, and Saxenda.  Meds ordered this encounter  Medications  . Semaglutide-Weight Management (WEGOVY) 0.25 MG/0.5ML SOAJ    Sig: Inject 0.25 mg into the skin once a week for 28 days, THEN 0.5 mg once a week for 28 days, THEN 0.5 mg once a week for 28 days.    Dispense:  4 mL    Refill:  1  . Semaglutide-Weight Management (WEGOVY) 1 MG/0.5ML SOAJ    Sig: Inject 1 mg into the skin once a week.    Dispense:  2 mL    Refill:  0    Fill after completed the previous strength     Penni Homans, MD

## 2020-11-04 NOTE — Assessment & Plan Note (Signed)
Encouraged heart healthy diet, increase exercise, avoid trans fats, consider a krill oil cap daily 

## 2020-11-04 NOTE — Assessment & Plan Note (Signed)
Well controlled, no changes to meds. Encouraged heart healthy diet such as the DASH diet and exercise as tolerated.  °

## 2020-11-04 NOTE — Assessment & Plan Note (Signed)
Encouraged DASH diet, decrease po intake and increase exercise as tolerated. Needs 7-8 hours of sleep nightly. Avoid trans fats, eat small, frequent meals every 4-5 hours with lean proteins, complex carbs and healthy fats. Minimize simple carbs, started on Wegovy 0.25 mg SQ weekly x 4 weeks then 0.5 mg weekly x 4 weeks then 1 mg weekly x 4 weeks. Next dose will increase to 1.7 mg weekly x 4 weeks then 2.4 mg weekly as tolerated.

## 2020-11-06 ENCOUNTER — Other Ambulatory Visit: Payer: BC Managed Care – PPO

## 2020-12-11 ENCOUNTER — Encounter: Payer: Self-pay | Admitting: Family Medicine

## 2020-12-12 ENCOUNTER — Other Ambulatory Visit: Payer: Self-pay | Admitting: Family Medicine

## 2020-12-12 MED ORDER — WEGOVY 0.5 MG/0.5ML ~~LOC~~ SOAJ: 0.5000 mg | SUBCUTANEOUS | 0 refills | Status: DC

## 2020-12-13 ENCOUNTER — Telehealth: Payer: Self-pay | Admitting: *Deleted

## 2020-12-13 NOTE — Telephone Encounter (Signed)
rx did not need authorization, pharmacy just needed to put in correct information.

## 2020-12-24 ENCOUNTER — Other Ambulatory Visit: Payer: Self-pay

## 2020-12-24 ENCOUNTER — Ambulatory Visit (HOSPITAL_BASED_OUTPATIENT_CLINIC_OR_DEPARTMENT_OTHER)
Admission: RE | Admit: 2020-12-24 | Discharge: 2020-12-24 | Disposition: A | Discharge status: Home / Self Care | Payer: BC Managed Care – PPO | Source: Ambulatory Visit | Attending: Family Medicine | Admitting: Family Medicine

## 2020-12-24 ENCOUNTER — Ambulatory Visit: Payer: BC Managed Care – PPO | Admitting: Family Medicine

## 2020-12-24 VITALS — BP 106/70 | HR 71 | Temp 98.0°F | Resp 12 | Ht 59.0 in | Wt 182.2 lb

## 2020-12-24 DIAGNOSIS — E282 Polycystic ovarian syndrome: Secondary | ICD-10-CM

## 2020-12-24 DIAGNOSIS — M79605 Pain in left leg: Secondary | ICD-10-CM

## 2020-12-24 DIAGNOSIS — E6609 Other obesity due to excess calories: Secondary | ICD-10-CM | POA: Diagnosis not present

## 2020-12-24 DIAGNOSIS — M79662 Pain in left lower leg: Secondary | ICD-10-CM | POA: Insufficient documentation

## 2020-12-24 DIAGNOSIS — R739 Hyperglycemia, unspecified: Secondary | ICD-10-CM

## 2020-12-24 DIAGNOSIS — E782 Mixed hyperlipidemia: Secondary | ICD-10-CM | POA: Diagnosis not present

## 2020-12-24 LAB — CBC
HCT: 35.3 % — ABNORMAL LOW (ref 36.0–46.0)
Hemoglobin: 11.5 g/dL — ABNORMAL LOW (ref 12.0–15.0)
MCHC: 32.5 g/dL (ref 30.0–36.0)
MCV: 78.7 fl (ref 78.0–100.0)
Platelets: 328 10*3/uL (ref 150.0–400.0)
RBC: 4.48 Mil/uL (ref 3.87–5.11)
RDW: 14.9 % (ref 11.5–15.5)
WBC: 5.8 10*3/uL (ref 4.0–10.5)

## 2020-12-24 LAB — COMPREHENSIVE METABOLIC PANEL
ALT: 13 U/L (ref 0–35)
AST: 13 U/L (ref 0–37)
Albumin: 4.2 g/dL (ref 3.5–5.2)
Alkaline Phosphatase: 50 U/L (ref 39–117)
BUN: 18 mg/dL (ref 6–23)
CO2: 27 mEq/L (ref 19–32)
Calcium: 9.1 mg/dL (ref 8.4–10.5)
Chloride: 103 mEq/L (ref 96–112)
Creatinine, Ser: 0.75 mg/dL (ref 0.40–1.20)
GFR: 96.54 mL/min (ref >60.00)
Glucose, Bld: 84 mg/dL (ref 70–99)
Potassium: 3.8 mEq/L (ref 3.5–5.1)
Sodium: 140 mEq/L (ref 135–145)
Total Bilirubin: 0.5 mg/dL (ref 0.2–1.2)
Total Protein: 6.7 g/dL (ref 6.0–8.3)

## 2020-12-24 LAB — LIPID PANEL
Cholesterol: 221 mg/dL — ABNORMAL HIGH (ref 0–200)
HDL: 51.9 mg/dL (ref >39.00)
LDL Cholesterol: 144 mg/dL — ABNORMAL HIGH (ref 0–99)
NonHDL: 168.84
Total CHOL/HDL Ratio: 4
Triglycerides: 122 mg/dL (ref 0.0–149.0)
VLDL: 24.4 mg/dL (ref 0.0–40.0)

## 2020-12-24 LAB — HEMOGLOBIN A1C: Hgb A1c MFr Bld: 5.9 % (ref 4.6–6.5)

## 2020-12-24 LAB — TSH: TSH: 2.19 u[IU]/mL (ref 0.35–4.50)

## 2020-12-24 MED ORDER — WEGOVY 2.4 MG/0.75ML ~~LOC~~ SOAJ: 2.4000 mg | SUBCUTANEOUS | 3 refills | Status: DC

## 2020-12-24 MED ORDER — WEGOVY 1.7 MG/0.75ML ~~LOC~~ SOAJ: 1.7000 mg | SUBCUTANEOUS | 0 refills | Status: DC

## 2020-12-24 NOTE — Patient Instructions (Signed)
High Cholesterol  High cholesterol is a condition in which the blood has high levels of a white, waxy substance similar to fat (cholesterol). The liver makes all the cholesterol that the body needs. The human body needs small amounts of cholesterol to help build cells. A person gets extra or excess cholesterol from the food that he or she eats. The blood carries cholesterol from the liver to the rest of the body. If you have high cholesterol, deposits (plaques) may build up on the walls of your arteries. Arteries are the blood vessels that carry blood away from your heart. These plaques make the arteries narrow and stiff. Cholesterol plaques increase your risk for heart attack and stroke. Work with your health care provider to keep your cholesterol levels in a healthy range. What increases the risk? The following factors may make you more likely to develop this condition:  Eating foods that are high in animal fat (saturated fat) or cholesterol.  Being overweight.  Not getting enough exercise.  A family history of high cholesterol (familial hypercholesterolemia).  Use of tobacco products.  Having diabetes. What are the signs or symptoms? There are no symptoms of this condition. How is this diagnosed? This condition may be diagnosed based on the results of a blood test.  If you are older than 45 years of age, your health care provider may check your cholesterol levels every 4-6 years.  You may be checked more often if you have high cholesterol or other risk factors for heart disease. The blood test for cholesterol measures:  "Bad" cholesterol, or LDL cholesterol. This is the main type of cholesterol that causes heart disease. The desired level is less than 100 mg/dL.  "Good" cholesterol, or HDL cholesterol. HDL helps protect against heart disease by cleaning the arteries and carrying the LDL to the liver for processing. The desired level for HDL is 60 mg/dL or higher.  Triglycerides.  These are fats that your body can store or burn for energy. The desired level is less than 150 mg/dL.  Total cholesterol. This measures the total amount of cholesterol in your blood and includes LDL, HDL, and triglycerides. The desired level is less than 200 mg/dL. How is this treated? This condition may be treated with:  Diet changes. You may be asked to eat foods that have more fiber and less saturated fats or added sugar.  Lifestyle changes. These may include regular exercise, maintaining a healthy weight, and quitting use of tobacco products.  Medicines. These are given when diet and lifestyle changes have not worked. You may be prescribed a statin medicine to help lower your cholesterol levels. Follow these instructions at home: Eating and drinking  Eat a healthy, balanced diet. This diet includes: ? Daily servings of a variety of fresh, frozen, or canned fruits and vegetables. ? Daily servings of whole grain foods that are rich in fiber. ? Foods that are low in saturated fats and trans fats. These include poultry and fish without skin, lean cuts of meat, and low-fat dairy products. ? A variety of fish, especially oily fish that contain omega-3 fatty acids. Aim to eat fish at least 2 times a week.  Avoid foods and drinks that have added sugar.  Use healthy cooking methods, such as roasting, grilling, broiling, baking, poaching, steaming, and stir-frying. Do not fry your food except for stir-frying.   Lifestyle  Get regular exercise. Aim to exercise for a total of 150 minutes a week. Increase your activity level by doing activities   such as gardening, walking, and taking the stairs.  Do not use any products that contain nicotine or tobacco, such as cigarettes, e-cigarettes, and chewing tobacco. If you need help quitting, ask your health care provider.   General instructions  Take over-the-counter and prescription medicines only as told by your health care provider.  Keep all  follow-up visits as told by your health care provider. This is important. Where to find more information  American Heart Association: www.heart.org  National Heart, Lung, and Blood Institute: www.nhlbi.nih.gov Contact a health care provider if:  You have trouble achieving or maintaining a healthy diet or weight.  You are starting an exercise program.  You are unable to stop smoking. Get help right away if:  You have chest pain.  You have trouble breathing.  You have any symptoms of a stroke. "BE FAST" is an easy way to remember the main warning signs of a stroke: ? B - Balance. Signs are dizziness, sudden trouble walking, or loss of balance. ? E - Eyes. Signs are trouble seeing or a sudden change in vision. ? F - Face. Signs are sudden weakness or numbness of the face, or the face or eyelid drooping on one side. ? A - Arms. Signs are weakness or numbness in an arm. This happens suddenly and usually on one side of the body. ? S - Speech. Signs are sudden trouble speaking, slurred speech, or trouble understanding what people say. ? T - Time. Time to call emergency services. Write down what time symptoms started.  You have other signs of a stroke, such as: ? A sudden, severe headache with no known cause. ? Nausea or vomiting. ? Seizure. These symptoms may represent a serious problem that is an emergency. Do not wait to see if the symptoms will go away. Get medical help right away. Call your local emergency services (911 in the [place] Do not drive yourself to the hospital. Summary  Cholesterol plaques increase your risk for heart attack and stroke. Work with your health care provider to keep your cholesterol levels in a healthy range.  Eat a healthy, balanced diet, get regular exercise, and maintain a healthy weight.  Do not use any products that contain nicotine or tobacco, such as cigarettes, e-cigarettes, and chewing tobacco.  Get help right away if you have any symptoms of a  stroke. This information is not intended to replace advice given to you by your health care provider. Make sure you discuss any questions you have with your health care provider. Document Revised: 07/25/2019 Document Reviewed: 07/25/2019 Elsevier Patient Education  2021 [employer]  

## 2020-12-24 NOTE — Assessment & Plan Note (Signed)
Encouraged heart healthy diet, increase exercise, avoid trans fats, consider a krill oil cap daily 

## 2020-12-24 NOTE — Assessment & Plan Note (Signed)
hgba1c acceptable, minimize simple carbs. Increase exercise as tolerated.  

## 2020-12-24 NOTE — Progress Notes (Signed)
Patient ID: Veronica Stokes, female    DOB: 1976/07/30  Age: 45 y.o. MRN: [id number]    Subjective:  Subjective  HPI Veronica Stokes presents for office visit today. She reports feeling pain in her LLE. She states that one point she had chronic compartment syndrome due to standing up during work very frequently. She states that she had multiple surgeries on her LLE, which contributed to the pain. She felt an exploding sensation of pain. She reports that it started last Wednesday night. She reports that using two pillows to prop her leg up during sleep helps relieve the pain, but as soon as she stands up the pain comes back. She describes the pain as a combination of throbbing and aching pain. She states that Advil has helped slightly with relieving the pain, but she tries not to take it frequently due to her stomach issues. She denies any chest pain, SOB, fever, abdominal pain, cough, chills, sore throat, dysuria, urinary incontinence, back pain, HA, or N/VD. She report that at the moment she is eating better and was able to lose 10 lbs. She states that her hair loss have subsided and feels that her immune system have improved once she started on WEGOVY 0.5 mg.   Review of Systems  Constitutional: Negative for chills, fatigue and fever.  HENT: Negative for congestion, rhinorrhea, sinus pressure, sinus pain and sore throat.   Eyes: Negative for pain.  Respiratory: Negative for cough and shortness of breath.   Cardiovascular: Positive for leg swelling. Negative for chest pain and palpitations.  Gastrointestinal: Negative for abdominal pain, blood in stool, diarrhea, nausea and vomiting.  Genitourinary: Negative for decreased urine volume, flank pain, frequency, vaginal bleeding and vaginal discharge.  Musculoskeletal: Positive for myalgias. Negative for back pain.       (+) LLE pain  Neurological: Negative for headaches.    History Past Medical History:  Diagnosis Date  .  Allergic state 12/18/2008   Qualifier: Diagnosis of  By: Birdie Riddle MD, Belenda Cruise    . Asthma    cough variant  . Chicken pox as a child  . Hyperglycemia 10/28/2015  . Hyperlipidemia, mixed 10/28/2015  . Insomnia 09/09/2016  . Obesity 09/09/2016  . Polycystic ovarian syndrome 8-12  . Preventative health care 10/28/2015    She has a past surgical history that includes Knee surgery (12-94, 10-07) and Wisdom tooth extraction (2001).   Her family history includes Alcohol abuse in her maternal grandmother; Cancer (age of onset: 48) in her maternal aunt and maternal aunt; Diabetes in her father, mother, and paternal grandmother; Hearing loss in her maternal grandmother; Heart attack in her maternal grandfather and paternal grandfather; Heart disease in her maternal grandfather; Hypertension in her mother; Mental illness in her maternal grandmother and sister; Stroke in her maternal grandmother, paternal grandfather, and paternal grandmother; Thyroid disease in her paternal grandmother.She reports that she has never smoked. She has never used smokeless tobacco. She reports that she does not drink alcohol and does not use drugs.  Current Outpatient Medications on File Prior to Visit  Medication Sig Dispense Refill  . acetaminophen (TYLENOL) 500 MG tablet Take 500 mg by mouth every 6 (six) hours as needed.    [name] acetic acid 2 % otic solution Place 4 drops into both ears 3 (three) times daily. 15 mL 0  . albuterol (PROVENTIL) (2.5 MG/3ML) 0.083% nebulizer solution Take 3 mLs (2.5 mg total) by nebulization every 4 (four) hours as needed for wheezing or shortness of breath.  75 mL 1  . albuterol (VENTOLIN HFA) 108 (90 Base) MCG/ACT inhaler Inhale 2 puffs into the lungs every 6 (six) hours as needed for wheezing. 18 g 1  . azelastine (ASTELIN) 0.1 % nasal spray Place 2 sprays into both nostrils 2 (two) times daily. Use in each nostril as directed 30 mL 5  . Blood Glucose Monitoring Suppl (FREESTYLE FREEDOM LITE)  w/Device KIT Use to check sugar once a day.  Dx Code: E16.2 1 kit 0  . calcium carbonate (OS-CAL) 600 MG TABS Take 600 mg by mouth daily.    . cetirizine (ZYRTEC) 10 MG tablet Take 10 mg by mouth daily.    . chlorthalidone (HYGROTON) 25 MG tablet TAKE 1 TABLET(25 MG) BY MOUTH DAILY 90 tablet 1  . Cyanocobalamin (B12 FAST DISSOLVE PO) Take by mouth.    [name] glucose blood (FREESTYLE LITE) test strip Use to check sugar once a day.  Dx Code: E16.2 50 each 1  . Insulin Pen Needle (PEN NEEDLES) 32G X 4 MM MISC Use with Saxenda once daily 100 each 0  . Lancets (FREESTYLE) lancets Use to check sugar once a day.  Dx Code: E16.2 100 each 1  . metFORMIN (GLUCOPHAGE-XR) 500 MG 24 hr tablet Take 1 tablet (500 mg total) by mouth daily with breakfast. 90 tablet 1  . Pediatric Multiple Vitamins (FLINTSTONES MULTIVITAMIN PO) Take 2 tablets by mouth.    . Semaglutide-Weight Management (WEGOVY) 0.5 MG/0.5ML SOAJ Inject 0.5 mg into the skin once a week. 2 mL 0  . Semaglutide-Weight Management (WEGOVY) 1 MG/0.5ML SOAJ Inject 1 mg into the skin once a week. 2 mL 0  . omeprazole (PRILOSEC) 20 MG capsule Take 1 capsule (20 mg total) by mouth daily. 30 capsule 11   No current facility-administered medications on file prior to visit.     Objective:  Objective  Physical Exam Constitutional:      General: She is not in acute distress.    Appearance: Normal appearance. She is not ill-appearing or toxic-appearing.  HENT:     Head: Normocephalic and atraumatic.     Right Ear: Tympanic membrane, ear canal and external ear normal.     Left Ear: Tympanic membrane, ear canal and external ear normal.     Nose: No congestion or rhinorrhea.  Eyes:     Extraocular Movements: Extraocular movements intact.     Pupils: Pupils are equal, round, and reactive to light.  Cardiovascular:     Rate and Rhythm: Normal rate and regular rhythm.     Pulses: Normal pulses.     Heart sounds: Normal heart sounds. No murmur  heard.   Pulmonary:     Effort: Pulmonary effort is normal. No respiratory distress.     Breath sounds: Normal breath sounds. No wheezing, rhonchi or rales.  Abdominal:     General: Bowel sounds are normal.     Palpations: Abdomen is soft. There is no mass.     Tenderness: There is no abdominal tenderness. There is no guarding.     Hernia: No hernia is present.  Musculoskeletal:        General: Normal range of motion.     Cervical back: Normal range of motion and neck supple.     Comments: -mild tenderness in calf with flexion of left foot, mild swelling behind left knee. No rednress or warmth  Skin:    General: Skin is warm and dry.  Neurological:     Mental Status: She is alert and  oriented to person, place, and time.  Psychiatric:        Behavior: Behavior normal.    BP 106/70 (BP Location: Left Arm, Cuff Size: Large)   Pulse 71   Temp 98 F (36.7 C) (Oral)   Resp 12   Ht 4' 11"  (1.499 m)   Wt 182 lb 3.2 oz (82.6 kg)   SpO2 97%   BMI 36.80 kg/m  Wt Readings from Last 3 Encounters:  12/24/20 182 lb 3.2 oz (82.6 kg)  11/01/20 188 lb 9.6 oz (85.5 kg)  06/14/20 180 lb (81.6 kg)     Lab Results  Component Value Date   WBC 7.5 05/17/2020   HGB 12.0 05/17/2020   HCT 37.6 05/17/2020   PLT 357 05/17/2020   GLUCOSE 92 05/17/2020   CHOL 232 (H) 05/17/2020   TRIG 120 05/17/2020   HDL 64 05/17/2020   LDLDIRECT 169.0 01/09/2020   LDLCALC 144 (H) 05/17/2020   ALT 16 05/17/2020   AST 17 05/17/2020   NA 139 05/17/2020   K 3.7 05/17/2020   CL 99 05/17/2020   CREATININE 0.76 05/17/2020   BUN 15 05/17/2020   CO2 29 05/17/2020   TSH 2.76 05/17/2020   HGBA1C 5.7 (H) 05/17/2020    DG Sacrum/Coccyx  Result Date: 01/17/2020 CLINICAL DATA:  Sacral and coccygeal pain since a fall yesterday. Initial encounter. EXAM: SACRUM AND COCCYX - 2+ VIEW COMPARISON:  None. FINDINGS: There is no acute bony or joint abnormality. 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 Soft tissues are normal in appearance. IMPRESSION: No acute abnormality. 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 Electronically Signed   By: Inge Rise M.D.   On: 01/17/2020 18:20     Assessment & Plan:  Plan    Meds ordered this encounter  Medications  . Semaglutide-Weight Management (WEGOVY) 1.7 MG/0.75ML SOAJ    Sig: Inject 1.7 mg into the skin once a week.    Dispense:  3 mL    Refill:  0  . Semaglutide-Weight Management (WEGOVY) 2.4 MG/0.75ML SOAJ    Sig: Inject 2.4 mg into the skin once a week.    Dispense:  3 mL    Refill:  3    Problem List Items Addressed This Visit    Polycystic ovarian syndrome    Facial hair is better, cycles are lighter since starting Wegovy      Relevant Orders   TSH   Hyperlipidemia, mixed    Encouraged heart healthy diet, increase exercise, avoid trans fats, consider a krill oil cap daily      Relevant Orders   Lipid panel   TSH   Hyperglycemia    hgba1c acceptable, minimize simple carbs. Increase exercise as tolerated.       Relevant Orders   Hemoglobin A1c   Comprehensive metabolic panel   TSH   Obesity    Consider MIND diet, decrease po intake and increase exercise as tolerated. Needs 7-8 hours of sleep nightly. Avoid trans fats, eat small, frequent meals every 4-5 hours with lean proteins, complex carbs and healthy fats. Minimize simple carbs, tolerating Wegovy and almost 10 pound weight.       Relevant Medications   Semaglutide-Weight Management (WEGOVY) 1.7 MG/0.75ML SOAJ   Semaglutide-Weight Management (WEGOVY) 2.4 MG/0.75ML SOAJ   Other Relevant Orders   TSH   Left leg pain    1 week of pain and swelling in left leg  from knee down. H/o compartment syndrome but did not require surgery was years ago. Will order ultrasound to rule out blood clot. Advil and resting helps slightly.        Other Visit Diagnoses    Pain  of left calf    -  Primary   Relevant Orders   US Venous Img Lower Unilateral Left (DVT)   CBC   Comprehensive metabolic panel      Follow-up: Return in about 3 months (around 03/25/2021).    I,David Hanna,acting as a scribe for Penni Homans, MD.,have documented all relevant documentation on the behalf of Penni Homans, MD,as directed by  Penni Homans, MD while in the presence of Penni Homans, MD.  I, Mosie Lukes, MD personally performed the services described in this documentation. All medical record entries made by the scribe were at my direction and in my presence. I have reviewed the chart and agree that the record reflects my personal performance and is accurate and complete

## 2020-12-24 NOTE — Assessment & Plan Note (Signed)
Facial hair is better, cycles are lighter since starting [facility]

## 2020-12-24 NOTE — Assessment & Plan Note (Signed)
1 week of pain and swelling in left leg from knee down. H/o compartment syndrome but did not require surgery was years ago. Will order ultrasound to rule out blood clot. Advil and resting helps slightly.

## 2020-12-24 NOTE — Assessment & Plan Note (Addendum)
Consider MIND diet, decrease po intake and increase exercise as tolerated. Needs 7-8 hours of sleep nightly. Avoid trans fats, eat small, frequent meals every 4-5 hours with lean proteins, complex carbs and healthy fats. Minimize simple carbs, tolerating Wegovy and almost 10 pound weight.

## 2021-01-08 ENCOUNTER — Encounter: Payer: Self-pay | Admitting: *Deleted

## 2021-04-01 ENCOUNTER — Telehealth: Payer: Self-pay

## 2021-04-01 ENCOUNTER — Telehealth (INDEPENDENT_AMBULATORY_CARE_PROVIDER_SITE_OTHER): Payer: BC Managed Care – PPO | Admitting: Family Medicine

## 2021-04-01 DIAGNOSIS — I159 Secondary hypertension, unspecified: Secondary | ICD-10-CM

## 2021-04-01 DIAGNOSIS — E282 Polycystic ovarian syndrome: Secondary | ICD-10-CM

## 2021-04-01 DIAGNOSIS — E782 Mixed hyperlipidemia: Secondary | ICD-10-CM

## 2021-04-01 DIAGNOSIS — R739 Hyperglycemia, unspecified: Secondary | ICD-10-CM

## 2021-04-01 DIAGNOSIS — J45991 Cough variant asthma: Secondary | ICD-10-CM | POA: Diagnosis not present

## 2021-04-01 DIAGNOSIS — E6609 Other obesity due to excess calories: Secondary | ICD-10-CM

## 2021-04-01 DIAGNOSIS — R252 Cramp and spasm: Secondary | ICD-10-CM

## 2021-04-01 MED ORDER — WEGOVY 2.4 MG/0.75ML ~~LOC~~ SOAJ: 2.4000 mg | SUBCUTANEOUS | 5 refills | Status: DC

## 2021-04-01 NOTE — Assessment & Plan Note (Signed)
Encourage heart healthy diet such as MIND or DASH diet, increase exercise, avoid trans fats, simple carbohydrates and processed foods, consider a krill or fish or flaxseed oil cap daily.  °

## 2021-04-01 NOTE — Assessment & Plan Note (Signed)
hgba1c acceptable, minimize simple carbs. Increase exercise as tolerated.  

## 2021-04-01 NOTE — Telephone Encounter (Signed)
Called pt to scheduled for lab appointment in next couple weeks.

## 2021-04-01 NOTE — Assessment & Plan Note (Signed)
Resolved with stopping metformin

## 2021-04-01 NOTE — Patient Instructions (Signed)
  Paxlovid or Molnupiravir is the new COVID medication we can give you if you get COVID so make sure you test if you have symptoms because we have to treat by day 5 of symptoms for it to be effective. If you are positive let us know so we can treat. If a home test is negative and your symptoms are persistent get a PCR test. Can check testing locations at [email or web address] If you are positive we will make an appointment with [place] and we will send in Paxlovid if you would like it. Check with your pharmacy before we meet to confirm they have it in stock, if they do not then we can get the prescription at the [facility]

## 2021-04-01 NOTE — Assessment & Plan Note (Signed)
No complaints of recent exacerbations

## 2021-04-01 NOTE — Assessment & Plan Note (Signed)
Monitor and report any concerns, no changes to meds. Encouraged heart healthy diet such as the DASH diet and exercise as tolerated.  ?

## 2021-04-01 NOTE — Assessment & Plan Note (Signed)
With insulin resistance. On Wegovy now and loosing weight

## 2021-04-01 NOTE — Assessment & Plan Note (Addendum)
Encouraged DASH or MIND diet, decrease po intake and increase exercise as tolerated. Needs 7-8 hours of sleep nightly. Avoid trans fats, eat small, frequent meals every 4-5 hours with lean proteins, complex carbs and healthy fats. Minimize simple carbs, high fat foods and processed foods. Tolerating Wegovy 2.4 mg refills given today. Will arrange for repeat lab work in next two weeks to monitor including hgba1c, insulin, cmp

## 2021-04-01 NOTE — Progress Notes (Signed)
MyChart Video Visit    Virtual Visit via Video Note   This visit type was conducted due to national recommendations for restrictions regarding the COVID-19 Pandemic (e.g. social distancing) in an effort to limit this patient's exposure and mitigate transmission in our community. This patient is at least at moderate risk for complications without adequate follow up. This format is felt to be most appropriate for this patient at this time. Physical exam was limited by quality of the video and audio technology used for the visit. S Chism, CMA was able to get the patient set up on a video visit.  Patient location: Home Patient and provider in visit Provider location: Office  I discussed the limitations of evaluation and management by telemedicine and the availability of in person appointments. The patient expressed understanding and agreed to proceed.  Visit Date: 04/01/2021  Today's healthcare provider: Penni Homans, MD      Subjective:    Patient ID: Veronica Stokes, female    DOB: Mar 25, 1976, 45 y.o.   MRN: [id number]  No chief complaint on file.   HPI Patient is in today for a video visit for follow up on PCOS, obesity, leg cramps and more. She notes her leg cramps have resolved with stopping metformin. No complaints of polyuria or polydipsia. No recent febrile illness or hospitalizations. She has tolerated the titration up of the [facility] to 2.4 mg weekly without any significant side effects and she notes it is suppressing her appetite well. Denies CP/palp/SOB/HA/congestion/fevers/GI or GU c/o. Taking meds as prescribed   Past Medical History:  Diagnosis Date   Allergic state 12/18/2008   Qualifier: Diagnosis of  By: Birdie Riddle MD, Belenda Cruise     Asthma    cough variant   Chicken pox as a child   Hyperglycemia 10/28/2015   Hyperlipidemia, mixed 10/28/2015   Insomnia 09/09/2016   Obesity 09/09/2016   Polycystic ovarian syndrome 8-12   Preventative health care 10/28/2015     Past Surgical History:  Procedure Laterality Date   KNEE SURGERY  12-94, 10-07   X 2 left   WISDOM TOOTH EXTRACTION  2001    Family History  Problem Relation Age of Onset   Diabetes Mother        type 2   Hypertension Mother    Diabetes Father        type 2- off of everything since gastric bypass   Mental illness Sister        bipolar, ADHD   Mental illness Maternal Grandmother        manic and bipolar   Stroke Maternal Grandmother    Alcohol abuse Maternal Grandmother    Hearing loss Maternal Grandmother    Heart disease Maternal Grandfather    Heart attack Maternal Grandfather    Diabetes Paternal Grandmother        type 2   Stroke Paternal Grandmother    Thyroid disease Paternal Grandmother    Stroke Paternal Grandfather    Heart attack Paternal Grandfather    Cancer Maternal Aunt 32       breast   Cancer Maternal Aunt 50       breast     Social History   Socioeconomic History   Marital status: Married    Spouse name: Not on file   Number of children: Not on file   Years of education: Not on file   Highest education level: Not on file  Occupational History   Not on file  Tobacco  Use   Smoking status: Never   Smokeless tobacco: Never  Vaping Use   Vaping Use: Never used  Substance and Sexual Activity   Alcohol use: No   Drug use: No   Sexual activity: Yes    Partners: Male    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     acetic acid 2 % otic solution Place 4 drops into both ears 3 (three) times daily. 15 mL 0   albuterol (PROVENTIL) (2.5 MG/3ML) 0.083% nebulizer solution Take 3 mLs (2.5 mg total) by nebulization every 4 (four) hours as needed for wheezing or shortness of breath. 75 mL 1   albuterol (VENTOLIN HFA) 108 (90 Base) MCG/ACT inhaler Inhale 2 puffs into the lungs every 6 (six) hours as needed for wheezing. 18 g 1   azelastine (ASTELIN) 0.1 % nasal spray Place 2 sprays into both nostrils 2 (two) times daily. Use in each nostril as directed 30 mL 5   Blood Glucose Monitoring Suppl (FREESTYLE FREEDOM LITE) w/Device KIT Use to check sugar once a day.  Dx Code: E16.2 1 kit 0   calcium carbonate (OS-CAL) 600 MG TABS Take 600 mg by mouth daily.     cetirizine (ZYRTEC) 10 MG tablet Take 10 mg by mouth daily.     Cyanocobalamin (B12 FAST DISSOLVE PO) Take by mouth.     glucose blood (FREESTYLE LITE) test strip Use to check sugar once a day.  Dx Code: E16.2 50 each 1   Insulin Pen Needle (PEN NEEDLES) 32G X 4 MM MISC Use with Saxenda once daily 100 each 0   Lancets (FREESTYLE) lancets Use to check sugar once a day.  Dx Code: E16.2 100 each 1   Pediatric Multiple Vitamins (FLINTSTONES MULTIVITAMIN PO) Take 2 tablets by mouth.     Semaglutide-Weight Management (WEGOVY) 0.5 MG/0.5ML SOAJ Inject 0.5 mg into the skin once a week. 2 mL 0   Semaglutide-Weight Management (WEGOVY) 1 MG/0.5ML SOAJ Inject 1 mg into the skin once a week. 2 mL 0   Semaglutide-Weight Management (WEGOVY) 1.7 MG/0.75ML SOAJ Inject 1.7 mg into the skin once a week. 3 mL 0   Semaglutide-Weight Management (WEGOVY) 2.4 MG/0.75ML SOAJ Inject 2.4 mg into the skin once a week. 3 mL 3   chlorthalidone (HYGROTON) 25 MG tablet TAKE 1 TABLET(25 MG) BY MOUTH DAILY 90 tablet 1   omeprazole (PRILOSEC) 20 MG capsule Take 1 capsule (20 mg total) by mouth daily. 30 capsule 11   metFORMIN (GLUCOPHAGE-XR) 500 MG 24 hr tablet Take 1 tablet (500  mg total) by mouth daily with breakfast. 90 tablet 1   No facility-administered medications prior to visit.    Allergies  Allergen Reactions   Corticosteroids Anaphylaxis   Eggs Or Egg-Derived Products    Latex    Metformin And Related Nausea And Vomiting   Penicillins     REACTION:  SWELLING   Sulfa Antibiotics Itching    Review of Systems  Constitutional:  Negative for fever and malaise/fatigue.  HENT:  Negative for congestion.   Eyes:  Negative for blurred vision.  Respiratory:  Negative for shortness of breath.   Cardiovascular:  Negative for chest pain, palpitations and leg swelling.  Gastrointestinal:  Negative for abdominal pain, blood in stool and nausea.  Genitourinary:  Negative for dysuria and frequency.  Musculoskeletal:  Negative for falls.  Skin:  Negative for rash.  Neurological:  Negative for dizziness, loss of consciousness and headaches.  Endo/Heme/Allergies:  Negative for environmental allergies.  Psychiatric/Behavioral:  Negative for depression. The patient is not nervous/anxious.       Objective:    Physical Exam Constitutional:      General: She is not in acute distress.    Appearance: Normal appearance. She is not ill-appearing or toxic-appearing.  HENT:     Head: Normocephalic and atraumatic.     Right Ear: External ear normal.     Left Ear: External ear normal.     Nose: Nose normal.  Eyes:     General:        Right eye: No discharge.        Left eye: No discharge.  Pulmonary:     Effort: Pulmonary effort is normal.  Skin:    Findings: No rash.  Neurological:     Mental Status: She is alert and oriented to person, place, and time.  Psychiatric:        Behavior: Behavior normal.    There were no vitals taken for this visit. Wt Readings from Last 3 Encounters:  12/24/20 182 lb 3.2 oz (82.6 kg)  11/01/20 188 lb 9.6 oz (85.5 kg)  06/14/20 180 lb (81.6 kg)    Diabetic Foot Exam - Simple   No data filed    Lab Results  Component  Value Date   WBC 5.8 12/24/2020   HGB 11.5 (L) 12/24/2020   HCT 35.3 (L) 12/24/2020   PLT 328.0 12/24/2020   GLUCOSE 84 12/24/2020   CHOL 221 (H) 12/24/2020   TRIG 122.0 12/24/2020   HDL 51.90 12/24/2020   LDLDIRECT 169.0 01/09/2020   LDLCALC 144 (H) 12/24/2020   ALT 13 12/24/2020   AST 13 12/24/2020   NA 140 12/24/2020   K 3.8 12/24/2020   CL 103 12/24/2020   CREATININE 0.75 12/24/2020   BUN 18 12/24/2020   CO2 27 12/24/2020   TSH 2.19 12/24/2020   HGBA1C 5.9 12/24/2020    Lab Results  Component Value Date   TSH 2.19 12/24/2020   Lab Results  Component Value Date   WBC 5.8 12/24/2020   HGB 11.5 (L) 12/24/2020   HCT 35.3 (L) 12/24/2020   MCV 78.7 12/24/2020   PLT 328.0 12/24/2020   Lab Results  Component Value Date   NA 140 12/24/2020   K 3.8 12/24/2020   CO2 27 12/24/2020   GLUCOSE 84 12/24/2020   BUN 18 12/24/2020   CREATININE 0.75 12/24/2020   BILITOT 0.5 12/24/2020   ALKPHOS 50 12/24/2020   AST 13 12/24/2020   ALT 13 12/24/2020   PROT 6.7 12/24/2020   ALBUMIN 4.2 12/24/2020   CALCIUM 9.1 12/24/2020   GFR 96.54 12/24/2020   Lab Results  Component Value Date   CHOL 221 (H) 12/24/2020   Lab Results  Component Value Date   HDL 51.90 12/24/2020   Lab Results  Component Value Date   LDLCALC 144 (H) 12/24/2020  Lab Results  Component Value Date   TRIG 122.0 12/24/2020   Lab Results  Component Value Date   CHOLHDL 4 12/24/2020   Lab Results  Component Value Date   HGBA1C 5.9 12/24/2020       Assessment & Plan:   Problem List Items Addressed This Visit     Polycystic ovarian syndrome    With insulin resistance. On Wegovy now and loosing weight       Cough variant asthma    No complaints of recent exacerbations       Hyperlipidemia, mixed    Encourage heart healthy diet such as MIND or DASH diet, increase exercise, avoid trans fats, simple carbohydrates and processed foods, consider a krill or fish or flaxseed oil cap daily.         Hyperglycemia    hgba1c acceptable, minimize simple carbs. Increase exercise as tolerated.       Leg cramp    Resolved with stopping metformin       Obesity    Encouraged DASH or MIND diet, decrease po intake and increase exercise as tolerated. Needs 7-8 hours of sleep nightly. Avoid trans fats, eat small, frequent meals every 4-5 hours with lean proteins, complex carbs and healthy fats. Minimize simple carbs, high fat foods and processed foods. Tolerating Wegovy 2.4 mg refills given today. Will arrange for repeat lab work in next two weeks to monitor including hgba1c, insulin, cmp       Relevant Medications   Semaglutide-Weight Management (WEGOVY) 2.4 MG/0.75ML SOAJ   Secondary hypertension    Monitor and report any concerns, no changes to meds. Encouraged heart healthy diet such as the DASH diet and exercise as tolerated.          Meds ordered this encounter  Medications   Semaglutide-Weight Management (WEGOVY) 2.4 MG/0.75ML SOAJ    Sig: Inject 2.4 mg into the skin once a week.    Dispense:  3 mL    Refill:  5    I discussed the assessment and treatment plan with the patient. The patient was provided an opportunity to ask questions and all were answered. The patient agreed with the plan and demonstrated an understanding of the instructions.   The patient was advised to call back or seek an in-person evaluation if the symptoms worsen or if the condition fails to improve as anticipated.  I provided 20 minutes of face-to-face time during this encounter.   Penni Homans, MD [facility] [phone] (phone) [phone] (fax)  [facility]

## 2021-05-09 ENCOUNTER — Encounter: Payer: Self-pay | Admitting: Family Medicine

## 2021-05-09 ENCOUNTER — Telehealth: Payer: BC Managed Care – PPO | Admitting: Family Medicine

## 2021-05-09 VITALS — Wt 164.0 lb

## 2021-05-09 DIAGNOSIS — I159 Secondary hypertension, unspecified: Secondary | ICD-10-CM

## 2021-05-09 DIAGNOSIS — R739 Hyperglycemia, unspecified: Secondary | ICD-10-CM | POA: Diagnosis not present

## 2021-05-09 DIAGNOSIS — E782 Mixed hyperlipidemia: Secondary | ICD-10-CM

## 2021-05-09 NOTE — Assessment & Plan Note (Signed)
Monitor and report any concerns, no changes to meds. Encouraged heart healthy diet such as the DASH diet and exercise as tolerated.  ?

## 2021-05-09 NOTE — Assessment & Plan Note (Signed)
Encourage heart healthy diet such as MIND or DASH diet, increase exercise, avoid trans fats, simple carbohydrates and processed foods, consider a krill or fish or flaxseed oil cap daily.  °

## 2021-05-09 NOTE — Patient Instructions (Signed)

## 2021-05-09 NOTE — Progress Notes (Signed)
MyChart Video Visit    Virtual Visit via Video Note   This visit type was conducted due to national recommendations for restrictions regarding the COVID-19 Pandemic (e.g. social distancing) in an effort to limit this patient's exposure and mitigate transmission in our community. This patient is at least at moderate risk for complications without adequate follow up. This format is felt to be most appropriate for this patient at this time. Physical exam was limited by quality of the video and audio technology used for the visit. S. Chism, CMA was able to get the patient set up on a video visit.  Patient location: Home Patient and provider in visit Provider location: Office  I discussed the limitations of evaluation and management by telemedicine and the availability of in person appointments. The patient expressed understanding and agreed to proceed.  Visit Date: 05/09/21  Today's healthcare provider: Penni Homans, MD     Subjective:    Patient ID: Veronica Stokes, female    DOB: 1976-02-24, 45 y.o.   MRN: [id number]  Chief Complaint  Patient presents with   Follow-up    HPI Patient is in today for virtual visit for follow up on recent covid infection. She endorses getting 3 covid tests later while recovering and she tested negative. Besides her recent sickness, she is doing good and was able to lose 16 lbs.  Past Medical History:  Diagnosis Date   Allergic state 12/18/2008   Qualifier: Diagnosis of  By: Birdie Riddle MD, Belenda Cruise     Asthma    cough variant   Chicken pox as a child   Hyperglycemia 10/28/2015   Hyperlipidemia, mixed 10/28/2015   Insomnia 09/09/2016   Obesity 09/09/2016   Polycystic ovarian syndrome 8-12   Preventative health care 10/28/2015    Past Surgical History:  Procedure Laterality Date   KNEE SURGERY  12-94, 10-07   X 2 left   WISDOM TOOTH EXTRACTION  2001    Family History  Problem Relation Age of Onset   Diabetes Mother        type 2    Hypertension Mother    Diabetes Father        type 2- off of everything since gastric bypass   Mental illness Sister        bipolar, ADHD   Mental illness Maternal Grandmother        manic and bipolar   Stroke Maternal Grandmother    Alcohol abuse Maternal Grandmother    Hearing loss Maternal Grandmother    Heart disease Maternal Grandfather    Heart attack Maternal Grandfather    Diabetes Paternal Grandmother        type 2   Stroke Paternal Grandmother    Thyroid disease Paternal Grandmother    Stroke Paternal Grandfather    Heart attack Paternal Grandfather    Cancer Maternal Aunt 81       breast   Cancer Maternal Aunt 50       breast     Social History   Socioeconomic History   Marital status: Married    Spouse name: Not on file   Number of children: Not on file   Years of education: Not on file   Highest education level: Not on file  Occupational History   Not on file  Tobacco Use   Smoking status: Never   Smokeless tobacco: Never  Vaping Use   Vaping Use: Never used  Substance and Sexual Activity   Alcohol use: No  Drug use: No   Sexual activity: Yes    Partners: Male    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     acetic acid 2 % otic solution Place 4 drops into both ears 3 (three) times daily. 15 mL 0   albuterol (PROVENTIL) (2.5 MG/3ML) 0.083% nebulizer solution Take 3 mLs (2.5 mg total) by nebulization every 4  (four) hours as needed for wheezing or shortness of breath. 75 mL 1   albuterol (VENTOLIN HFA) 108 (90 Base) MCG/ACT inhaler Inhale 2 puffs into the lungs every 6 (six) hours as needed for wheezing. 18 g 1   azelastine (ASTELIN) 0.1 % nasal spray Place 2 sprays into both nostrils 2 (two) times daily. Use in each nostril as directed 30 mL 5   Blood Glucose Monitoring Suppl (FREESTYLE FREEDOM LITE) w/Device KIT Use to check sugar once a day.  Dx Code: E16.2 1 kit 0   calcium carbonate (OS-CAL) 600 MG TABS Take 600 mg by mouth daily.     cetirizine (ZYRTEC) 10 MG tablet Take 10 mg by mouth daily.     chlorthalidone (HYGROTON) 25 MG tablet TAKE 1 TABLET(25 MG) BY MOUTH DAILY 90 tablet 1   Cyanocobalamin (B12 FAST DISSOLVE PO) Take by mouth.     glucose blood (FREESTYLE LITE) test strip Use to check sugar once a day.  Dx Code: E16.2 50 each 1   Insulin Pen Needle (PEN NEEDLES) 32G X 4 MM MISC Use with Saxenda once daily 100 each 0   Lancets (FREESTYLE) lancets Use to check sugar once a day.  Dx Code: E16.2 100 each 1   Pediatric Multiple Vitamins (FLINTSTONES MULTIVITAMIN PO) Take 2 tablets by mouth.     Semaglutide-Weight Management (WEGOVY) 2.4 MG/0.75ML SOAJ Inject 2.4 mg into the skin once a week. 3 mL 5   omeprazole (PRILOSEC) 20 MG capsule Take 1 capsule (20 mg total) by mouth daily. 30 capsule 11   No facility-administered medications prior to visit.    Allergies  Allergen Reactions   Corticosteroids Anaphylaxis   Eggs Or Egg-Derived Products    Latex    Metformin And Related Nausea And Vomiting   Penicillins     REACTION: SWELLING   Sulfa Antibiotics Itching    Review of Systems  Constitutional:  Negative for chills, fever and malaise/fatigue.  HENT:  Negative for congestion, sinus pain and sore throat.   Eyes:  Negative for blurred vision.  Respiratory:  Negative for cough and shortness of breath.   Cardiovascular:  Negative for chest pain, palpitations and leg swelling.   Gastrointestinal:  Negative for blood in stool, diarrhea, nausea and vomiting.  Genitourinary:  Negative for flank pain and frequency.  Musculoskeletal:  Negative for back pain.  Skin:  Negative for rash.  Neurological:  Negative for headaches.      Objective:    Physical Exam Constitutional:  Appearance: Normal appearance.  HENT:     Head: Normocephalic and atraumatic.     Right Ear: External ear normal.     Left Ear: External ear normal.  Pulmonary:     Effort: Pulmonary effort is normal.  Musculoskeletal:        General: Normal range of motion.     Cervical back: Normal range of motion.  Skin:    General: Skin is dry.  Neurological:     Mental Status: She is alert and oriented to person, place, and time.  Psychiatric:        Behavior: Behavior normal.    Wt 164 lb (74.4 kg)   BMI 33.12 kg/m  Wt Readings from Last 3 Encounters:  05/09/21 164 lb (74.4 kg)  12/24/20 182 lb 3.2 oz (82.6 kg)  11/01/20 188 lb 9.6 oz (85.5 kg)    Diabetic Foot Exam - Simple   No data filed    Lab Results  Component Value Date   WBC 5.8 12/24/2020   HGB 11.5 (L) 12/24/2020   HCT 35.3 (L) 12/24/2020   PLT 328.0 12/24/2020   GLUCOSE 84 12/24/2020   CHOL 221 (H) 12/24/2020   TRIG 122.0 12/24/2020   HDL 51.90 12/24/2020   LDLDIRECT 169.0 01/09/2020   LDLCALC 144 (H) 12/24/2020   ALT 13 12/24/2020   AST 13 12/24/2020   NA 140 12/24/2020   K 3.8 12/24/2020   CL 103 12/24/2020   CREATININE 0.75 12/24/2020   BUN 18 12/24/2020   CO2 27 12/24/2020   TSH 2.19 12/24/2020   HGBA1C 5.9 12/24/2020    Lab Results  Component Value Date   TSH 2.19 12/24/2020   Lab Results  Component Value Date   WBC 5.8 12/24/2020   HGB 11.5 (L) 12/24/2020   HCT 35.3 (L) 12/24/2020   MCV 78.7 12/24/2020   PLT 328.0 12/24/2020   Lab Results  Component Value Date   NA 140 12/24/2020   K 3.8 12/24/2020   CO2 27 12/24/2020   GLUCOSE 84 12/24/2020   BUN 18 12/24/2020   CREATININE 0.75  12/24/2020   BILITOT 0.5 12/24/2020   ALKPHOS 50 12/24/2020   AST 13 12/24/2020   ALT 13 12/24/2020   PROT 6.7 12/24/2020   ALBUMIN 4.2 12/24/2020   CALCIUM 9.1 12/24/2020   GFR 96.54 12/24/2020   Lab Results  Component Value Date   CHOL 221 (H) 12/24/2020   Lab Results  Component Value Date   HDL 51.90 12/24/2020   Lab Results  Component Value Date   LDLCALC 144 (H) 12/24/2020   Lab Results  Component Value Date   TRIG 122.0 12/24/2020   Lab Results  Component Value Date   CHOLHDL 4 12/24/2020   Lab Results  Component Value Date   HGBA1C 5.9 12/24/2020       Assessment & Plan:   Problem List Items Addressed This Visit   None     No orders of the defined types were placed in this encounter.   I discussed the assessment and treatment plan with the patient. The patient was provided an opportunity to ask questions and all were answered. The patient agreed with the plan and demonstrated an understanding of the instructions.   The patient was advised to call back or seek an in-person evaluation if the symptoms worsen or if the condition fails to improve as anticipated.  I provided 20 minutes of face-to-face time during this encounter.   Penni Homans, MD [facility]  at [place] (phone) [phone] (fax)  [place], Suezanne Jacquet, acting as a scribe for Penni Homans, MD, have documented all relevent documentation on behalf of Penni Homans, MD, as directed by Penni Homans, MD while in the presence of Penni Homans, MD. DO:05/09/21.  I, Mosie Lukes, MD personally performed the services described in this documentation. All medical record entries made by the scribe were at my direction and in my presence. I have reviewed the chart and agree that the record reflects my personal performance and is accurate and complete

## 2021-05-09 NOTE — Assessment & Plan Note (Signed)
hgba1c acceptable, minimize simple carbs. Increase exercise as tolerated. Check insulin levels 

## 2021-06-28 ENCOUNTER — Telehealth: Payer: Self-pay

## 2021-06-28 NOTE — Telephone Encounter (Signed)
Started prior Wynonia Musty 2.4mg /0.75 ML CVE:[id number]   Waiting for next step

## 2021-07-01 ENCOUNTER — Other Ambulatory Visit: Payer: Self-pay

## 2021-07-01 ENCOUNTER — Other Ambulatory Visit (INDEPENDENT_AMBULATORY_CARE_PROVIDER_SITE_OTHER): Payer: BC Managed Care – PPO

## 2021-07-01 DIAGNOSIS — R739 Hyperglycemia, unspecified: Secondary | ICD-10-CM

## 2021-07-01 DIAGNOSIS — I159 Secondary hypertension, unspecified: Secondary | ICD-10-CM | POA: Diagnosis not present

## 2021-07-01 DIAGNOSIS — E782 Mixed hyperlipidemia: Secondary | ICD-10-CM

## 2021-07-01 LAB — LIPID PANEL
Cholesterol: 236 mg/dL — ABNORMAL HIGH (ref 0–200)
HDL: 52.6 mg/dL (ref >39.00)
LDL Cholesterol: 153 mg/dL — ABNORMAL HIGH (ref 0–99)
NonHDL: 183.38
Total CHOL/HDL Ratio: 4
Triglycerides: 150 mg/dL — ABNORMAL HIGH (ref 0.0–149.0)
VLDL: 30 mg/dL (ref 0.0–40.0)

## 2021-07-01 LAB — CBC
HCT: 38.1 % (ref 36.0–46.0)
Hemoglobin: 12.3 g/dL (ref 12.0–15.0)
MCHC: 32.3 g/dL (ref 30.0–36.0)
MCV: 79.8 fl (ref 78.0–100.0)
Platelets: 320 10*3/uL (ref 150.0–400.0)
RBC: 4.77 Mil/uL (ref 3.87–5.11)
RDW: 15.8 % — ABNORMAL HIGH (ref 11.5–15.5)
WBC: 6.3 10*3/uL (ref 4.0–10.5)

## 2021-07-01 LAB — COMPREHENSIVE METABOLIC PANEL
ALT: 12 U/L (ref 0–35)
AST: 15 U/L (ref 0–37)
Albumin: 4.6 g/dL (ref 3.5–5.2)
Alkaline Phosphatase: 49 U/L (ref 39–117)
BUN: 13 mg/dL (ref 6–23)
CO2: 28 mEq/L (ref 19–32)
Calcium: 9.7 mg/dL (ref 8.4–10.5)
Chloride: 102 mEq/L (ref 96–112)
Creatinine, Ser: 0.68 mg/dL (ref 0.40–1.20)
GFR: 105.23 mL/min (ref >60.00)
Glucose, Bld: 74 mg/dL (ref 70–99)
Potassium: 4.3 mEq/L (ref 3.5–5.1)
Sodium: 140 mEq/L (ref 135–145)
Total Bilirubin: 0.6 mg/dL (ref 0.2–1.2)
Total Protein: 7.1 g/dL (ref 6.0–8.3)

## 2021-07-01 LAB — HEMOGLOBIN A1C: Hgb A1c MFr Bld: 5.4 % (ref 4.6–6.5)

## 2021-07-01 LAB — TSH: TSH: 1.98 u[IU]/mL (ref 0.35–5.50)

## 2021-07-02 LAB — INSULIN, RANDOM: Insulin: 6.2 u[IU]/mL

## 2021-07-03 ENCOUNTER — Encounter: Payer: Self-pay | Admitting: Family Medicine

## 2021-07-04 NOTE — Telephone Encounter (Signed)
Pt stated they checked with her insurance today and pre authorization order was dismissed Monday because they did not receive the submission by Friday. Pt states they will have to re due the authorization process. Pt states there is a number that can be called to fast track medication because she needs it asap the number is [phone].

## 2021-07-04 NOTE — Telephone Encounter (Signed)
Hey I called the number and it showed up as cover my meds

## 2021-07-04 NOTE — Telephone Encounter (Signed)
Resubmission was created d/t denial of claim. Use same Key: [id number]. This will generate more clinical questions to be answered on the PA.

## 2021-07-04 NOTE — Telephone Encounter (Signed)
Ryan from [employer] is calling regarding pt. They stated they need pre authorization regarding  Semaglutide-Weight Management (WEGOVY) 2.4 MG/0.75ML SOAJ  . They can be reached at [phone] and they left a reference key [id number]

## 2021-07-04 NOTE — Telephone Encounter (Signed)
[phone] is the number to [employer]. Do you by chance have that number?

## 2021-07-05 NOTE — Telephone Encounter (Signed)
Spoke with patient and her current weight is 166lb  Will retry and send PA.

## 2021-07-05 NOTE — Telephone Encounter (Signed)
Spoke with insurance and they gave a key [id number]  Prior auth started and awaiting determination.

## 2021-09-17 ENCOUNTER — Encounter: Payer: Self-pay | Admitting: Family Medicine

## 2021-09-19 NOTE — Telephone Encounter (Signed)
Pt aware.

## 2021-09-26 ENCOUNTER — Encounter: Payer: Self-pay | Admitting: Family Medicine

## 2021-09-26 ENCOUNTER — Other Ambulatory Visit: Payer: Self-pay | Admitting: Family Medicine

## 2021-09-26 MED ORDER — CHLORTHALIDONE 25 MG PO TABS: ORAL_TABLET | ORAL | 1 refills | Status: DC

## 2021-09-27 NOTE — Telephone Encounter (Signed)
done

## 2021-10-27 ENCOUNTER — Other Ambulatory Visit: Payer: Self-pay | Admitting: Family Medicine

## 2021-10-28 MED ORDER — WEGOVY 2.4 MG/0.75ML ~~LOC~~ SOAJ: 2.4000 mg | SUBCUTANEOUS | 0 refills | Status: DC

## 2021-11-29 ENCOUNTER — Telehealth (INDEPENDENT_AMBULATORY_CARE_PROVIDER_SITE_OTHER): Payer: BC Managed Care – PPO | Admitting: Family Medicine

## 2021-11-29 ENCOUNTER — Encounter: Payer: Self-pay | Admitting: Family Medicine

## 2021-11-29 ENCOUNTER — Ambulatory Visit: Payer: BC Managed Care – PPO | Admitting: Family Medicine

## 2021-11-29 VITALS — Ht 59.0 in | Wt 147.0 lb

## 2021-11-29 DIAGNOSIS — E6609 Other obesity due to excess calories: Secondary | ICD-10-CM | POA: Diagnosis not present

## 2021-11-29 DIAGNOSIS — E282 Polycystic ovarian syndrome: Secondary | ICD-10-CM | POA: Diagnosis not present

## 2021-11-29 DIAGNOSIS — R739 Hyperglycemia, unspecified: Secondary | ICD-10-CM | POA: Diagnosis not present

## 2021-11-29 MED ORDER — WEGOVY 2.4 MG/0.75ML ~~LOC~~ SOAJ: 2.4000 mg | SUBCUTANEOUS | 3 refills | Status: DC

## 2021-11-29 NOTE — Progress Notes (Signed)
Virtual Video Visit via MyChart Note ? ?I connected with  Veronica Stokes on 11/29/21 at 11:20 AM EDT by the video enabled telemedicine application for MyChart, and verified that I am speaking with the correct person using two identifiers. ?  ?I introduced myself as a Publishing rights manager with the practice. We discussed the limitations of evaluation and management by telemedicine and the availability of in person appointments. The patient expressed understanding and agreed to proceed. ? ?Participating parties in this visit include: The patient and the nurse practitioner listed.  ?The patient is: At home ?I am: In the office - [facility] ? ?Subjective:   ? ?CC: Wegovy f/u ? ? ?HPI: Veronica Stokes is a 46 y.o. year old female presenting today via MyChart today for [facility] f/u. ? ?Patient reports she has been doing well on Wegovy 2.5 mg.  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  States she was not able to tolerate metformin in the past.  A1c is very well controlled at 5.5% 2 months ago.  She is not having any side effects. ? ? ? ?Past medical history, Surgical history, Family history not pertinant except as noted below, Social history, Allergies, and medications have been entered into the medical record, reviewed, and corrections made.  ? ?Review of Systems:  ?All review of systems negative except what is listed in the HPI ? ? ?Objective:   ? ?General:  ?Speaking clearly in complete sentences. ?Absent shortness of breath noted.   ?Alert and oriented x3.   ?Normal judgment.  ?Absent acute distress. ? ? ?Impression and Recommendations:   ? ?1. Hyperglycemia ?2. Polycystic ovarian syndrome ?3. Obesity due to excess calories without serious comorbidity, unspecified classification ?Doing very well on Wegovy without any adverse effects.  Continue current dose at 2.5 mg weekly, refills provided.  Continue with  lifestyle modifications including heart healthy, low carb diet, portion control, regular exercise. ? ?- Semaglutide-Weight Management (WEGOVY) 2.4 MG/0.75ML SOAJ; Inject 2.4 mg into the skin once a week.  Dispense: 3 mL; Refill: 3 ? ? ?Follow-up if symptoms worsen or fail to improve. Keep upcoming appointments. ?  ?I discussed the assessment and treatment plan with the patient. The patient was provided an opportunity to ask questions and all were answered. The patient agreed with the plan and demonstrated an understanding of the instructions. ?  ?The patient was advised to call back or seek an in-person evaluation if the symptoms worsen or if the condition fails to improve as anticipated. ? ?I spent 20 minutes dedicated to the care of this patient on the date of this encounter to include pre-visit chart review of prior notes and results, face-to-face time with the patient, and post-visit ordering of testing as indicated.  ? ?Clayborne Dana, NP  ? ?

## 2021-12-09 ENCOUNTER — Ambulatory Visit: Payer: BC Managed Care – PPO | Admitting: Family Medicine

## 2022-02-18 ENCOUNTER — Encounter: Payer: Self-pay | Admitting: Family Medicine

## 2022-02-19 ENCOUNTER — Other Ambulatory Visit: Payer: Self-pay

## 2022-02-19 DIAGNOSIS — E6609 Other obesity due to excess calories: Secondary | ICD-10-CM

## 2022-02-19 DIAGNOSIS — E282 Polycystic ovarian syndrome: Secondary | ICD-10-CM

## 2022-02-19 DIAGNOSIS — R739 Hyperglycemia, unspecified: Secondary | ICD-10-CM

## 2022-02-19 MED ORDER — WEGOVY 2.4 MG/0.75ML ~~LOC~~ SOAJ: 2.4000 mg | SUBCUTANEOUS | 3 refills | Status: DC

## 2022-05-08 ENCOUNTER — Encounter: Payer: Self-pay | Admitting: Family Medicine

## 2022-05-08 MED ORDER — CHLORTHALIDONE 25 MG PO TABS: ORAL_TABLET | ORAL | 1 refills | Status: DC

## 2022-05-25 NOTE — Assessment & Plan Note (Signed)
Hydrate and monitor 

## 2022-05-25 NOTE — Assessment & Plan Note (Signed)
Encourage heart healthy diet such as MIND or DASH diet, increase exercise, avoid trans fats, simple carbohydrates and processed foods, consider a krill or fish or flaxseed oil cap daily.  °

## 2022-05-25 NOTE — Assessment & Plan Note (Signed)
hgba1c acceptable, minimize simple carbs. Increase exercise as tolerated.  

## 2022-05-25 NOTE — Assessment & Plan Note (Signed)
Encouraged DASH or MIND diet, decrease po intake and increase exercise as tolerated. Needs 7-8 hours of sleep nightly. Avoid trans fats, eat small, frequent meals every 4-5 hours with lean proteins, complex carbs and healthy fats. Minimize simple carbs, high fat foods and processed foods. Tolerating Wegovy 

## 2022-05-25 NOTE — Assessment & Plan Note (Signed)
Well controlled, no changes to meds. Encouraged heart healthy diet such as the DASH diet and exercise as tolerated.  °

## 2022-05-25 NOTE — Progress Notes (Unsigned)
Subjective:    Patient ID: Veronica Stokes, female    DOB: 1976-03-14, 46 y.o.   MRN: [id number]  No chief complaint on file.   HPI Patient is in today for ***  Past Medical History:  Diagnosis Date   Allergic state 12/18/2008   Qualifier: Diagnosis of  By: Birdie Riddle MD, Belenda Cruise     Asthma    cough variant   Chicken pox as a child   Hyperglycemia 10/28/2015   Hyperlipidemia, mixed 10/28/2015   Insomnia 09/09/2016   Obesity 09/09/2016   Polycystic ovarian syndrome 8-12   Preventative health care 10/28/2015    Past Surgical History:  Procedure Laterality Date   KNEE SURGERY  12-94, 10-07   X 2 left   WISDOM TOOTH EXTRACTION  2001    Family History  Problem Relation Age of Onset   Diabetes Mother        type 2   Hypertension Mother    Diabetes Father        type 2- off of everything since gastric bypass   Mental illness Sister        bipolar, ADHD   Mental illness Maternal Grandmother        manic and bipolar   Stroke Maternal Grandmother    Alcohol abuse Maternal Grandmother    Hearing loss Maternal Grandmother    Heart disease Maternal Grandfather    Heart attack Maternal Grandfather    Diabetes Paternal Grandmother        type 2   Stroke Paternal Grandmother    Thyroid disease Paternal Grandmother    Stroke Paternal Grandfather    Heart attack Paternal Grandfather    Cancer Maternal Aunt 47       breast   Cancer Maternal Aunt 50       breast     Social History   Socioeconomic History   Marital status: Married    Spouse name: Not on file   Number of children: Not on file   Years of education: Not on file   Highest education level: Not on file  Occupational History   Not on file  Tobacco Use   Smoking status: Never   Smokeless tobacco: Never  Vaping Use   Vaping Use: Never used  Substance and Sexual Activity   Alcohol use: No   Drug use: No   Sexual activity: Yes    Partners: Male    Comment: 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     acetic acid 2 % otic solution Place 4 drops into both ears 3 (three) times daily. 15 mL 0   albuterol (PROVENTIL) (2.5 MG/3ML) 0.083% nebulizer solution Take 3 mLs (2.5 mg total) by nebulization every 4 (four) hours as needed for wheezing or shortness of breath. 75 mL 1   albuterol (VENTOLIN HFA) 108 (90 Base) MCG/ACT inhaler Inhale 2 puffs into the  lungs every 6 (six) hours as needed for wheezing. 18 g 1   azelastine (ASTELIN) 0.1 % nasal spray Place 2 sprays into both nostrils 2 (two) times daily. Use in each nostril as directed 30 mL 5   Blood Glucose Monitoring Suppl (FREESTYLE FREEDOM LITE) w/Device KIT Use to check sugar once a day.  Dx Code: E16.2 1 kit 0   calcium carbonate (OS-CAL) 600 MG TABS Take 600 mg by mouth daily.     cetirizine (ZYRTEC) 10 MG tablet Take 10 mg by mouth daily.     chlorthalidone (HYGROTON) 25 MG tablet TAKE 1 TABLET(25 MG) BY MOUTH DAILY 90 tablet 1   Cyanocobalamin (B12 FAST DISSOLVE PO) Take by mouth.     glucose blood (FREESTYLE LITE) test strip Use to check sugar once a day.  Dx Code: E16.2 50 each 1   Insulin Pen Needle (PEN NEEDLES) 32G X 4 MM MISC Use with Saxenda once daily 100 each 0   Lancets (FREESTYLE) lancets Use to check sugar once a day.  Dx Code: E16.2 100 each 1   omeprazole (PRILOSEC) 20 MG capsule Take 1 capsule (20 mg total) by mouth daily. 30 capsule 11   Pediatric Multiple Vitamins (FLINTSTONES  MULTIVITAMIN PO) Take 2 tablets by mouth.     Semaglutide-Weight Management (WEGOVY) 2.4 MG/0.75ML SOAJ Inject 2.4 mg into the skin once a week. 3 mL 3   No facility-administered medications prior to visit.    Allergies  Allergen Reactions   Corticosteroids Anaphylaxis   Eggs Or Egg-Derived Products    Latex    Metformin And Related Nausea And Vomiting   Penicillins     REACTION: SWELLING   Sulfa Antibiotics Itching    Review of Systems  Constitutional:  Negative for chills, fever and malaise/fatigue.  HENT:  Negative for congestion and hearing loss.   Eyes:  Negative for discharge.  Respiratory:  Negative for cough, sputum production and shortness of breath.   Cardiovascular:  Negative for chest pain, palpitations and leg swelling.  Gastrointestinal:  Negative for abdominal pain, blood in stool, constipation, diarrhea, heartburn, nausea and vomiting.  Genitourinary:  Negative for dysuria, frequency, hematuria and urgency.  Musculoskeletal:  Negative for back pain, falls and myalgias.  Skin:  Negative for rash.  Neurological:  Negative for dizziness, sensory change, loss of consciousness, weakness and headaches.  Endo/Heme/Allergies:  Negative for environmental allergies. Does not bruise/bleed easily.  Psychiatric/Behavioral:  Negative for depression and suicidal ideas. The patient is not nervous/anxious and does not have insomnia.        Objective:    Physical Exam Constitutional:      General: She is not in acute distress.    Appearance: She is well-developed.  HENT:     Head: Normocephalic and atraumatic.  Eyes:     Conjunctiva/sclera: Conjunctivae normal.  Neck:     Thyroid: No thyromegaly.  Cardiovascular:     Rate and Rhythm: Normal rate and regular rhythm.     Heart sounds: Normal heart sounds. No murmur heard. Pulmonary:     Effort: Pulmonary effort is normal. No respiratory distress.     Breath sounds: Normal breath sounds.  Abdominal:     General: Bowel  sounds are normal. There is no distension.     Palpations: Abdomen is soft. There is no mass.     Tenderness: There is no abdominal tenderness.  Musculoskeletal:     Cervical back: Neck supple.  Lymphadenopathy:     Cervical: No cervical adenopathy.  Skin:    General: Skin is warm and dry.  Neurological:     Mental Status: She is alert and oriented to person, place, and time.  Psychiatric:        Behavior: Behavior normal.    There were no vitals taken for this visit. Wt Readings from Last 3 Encounters:  11/29/21 147 lb (66.7 kg)  05/09/21 164 lb (74.4 kg)  12/24/20 182 lb 3.2 oz (82.6 kg)    Diabetic Foot Exam - Simple   No data filed    Lab Results  Component Value Date   WBC 6.3 07/01/2021   HGB 12.3 07/01/2021   HCT 38.1 07/01/2021   PLT 320.0 07/01/2021   GLUCOSE 74 07/01/2021   CHOL 236 (H) 07/01/2021   TRIG 150.0 (H) 07/01/2021   HDL 52.60 07/01/2021   LDLDIRECT 169.0 01/09/2020   LDLCALC 153 (H) 07/01/2021   ALT 12 07/01/2021   AST 15 07/01/2021   NA 140 07/01/2021   K 4.3 07/01/2021   CL 102 07/01/2021   CREATININE 0.68 07/01/2021   BUN 13 07/01/2021   CO2 28 07/01/2021   TSH 1.98 07/01/2021   HGBA1C 5.4 07/01/2021    Lab Results  Component Value Date   TSH 1.98 07/01/2021   Lab Results  Component Value Date   WBC 6.3 07/01/2021   HGB 12.3 07/01/2021   HCT 38.1 07/01/2021   MCV 79.8 07/01/2021   PLT 320.0 07/01/2021   Lab Results  Component Value Date   NA 140 07/01/2021   K 4.3 07/01/2021   CO2 28 07/01/2021   GLUCOSE 74 07/01/2021   BUN 13 07/01/2021   CREATININE 0.68 07/01/2021   BILITOT 0.6 07/01/2021   ALKPHOS 49 07/01/2021   AST 15 07/01/2021   ALT 12 07/01/2021   PROT 7.1 07/01/2021   ALBUMIN 4.6 07/01/2021   CALCIUM 9.7 07/01/2021   GFR 105.23 07/01/2021   Lab Results  Component Value Date   CHOL 236 (H) 07/01/2021   Lab Results  Component Value Date   HDL 52.60 07/01/2021   Lab Results  Component Value Date    LDLCALC 153 (H) 07/01/2021   Lab Results  Component Value Date   TRIG 150.0 (H) 07/01/2021   Lab Results  Component Value Date   CHOLHDL 4 07/01/2021   Lab Results  Component Value Date   HGBA1C 5.4 07/01/2021       Assessment & Plan:   Problem List Items Addressed This Visit     Preventative health care    Patient encouraged to maintain heart healthy diet, regular exercise, adequate sleep. Consider daily probiotics. Take medications as prescribed. Labs ordered and reviewed. Needs repeat diagnostic MGM. Encouraged to proceed with screening colonoscopy Pap with OB/GYN      Hyperlipidemia, mixed    Encourage heart healthy diet such as MIND or DASH diet, increase exercise, avoid trans fats, simple carbohydrates and processed foods, consider a krill or fish or flaxseed oil cap daily.       Hyperglycemia    hgba1c acceptable, minimize simple carbs. Increase exercise as tolerated.       Leg cramp    Hydrate and monitor      Obesity    Encouraged DASH or MIND diet, decrease po intake and increase exercise as tolerated. Needs 7-8 hours of sleep nightly. Avoid trans fats, eat small, frequent meals every 4-5 hours with lean proteins, complex carbs and healthy fats. Minimize simple carbs, high fat foods and processed foods. Tolerating  QIXMDE      Secondary hypertension    Well controlled, no changes to meds. Encouraged heart healthy diet such as the DASH diet and exercise as tolerated.        I am having Veronica Stokes. Veronica "Kathlee Nations" maintain her calcium carbonate, Pediatric Multiple Vitamins (FLINTSTONES MULTIVITAMIN PO), albuterol, cetirizine, Cyanocobalamin (B12 FAST DISSOLVE PO), albuterol, omeprazole, azelastine, acetic acid, acetaminophen, Pen Needles, FREESTYLE LITE, FreeStyle Freedom Lite, freestyle, Wegovy, and chlorthalidone.  No orders of the defined types were placed in this encounter.    Penni Homans, MD

## 2022-05-25 NOTE — Assessment & Plan Note (Addendum)
Patient encouraged to maintain heart healthy diet, regular exercise, adequate sleep. Consider daily probiotics. Take medications as prescribed. Labs ordered and reviewed. Needs repeat diagnostic MGM. Encouraged to proceed with screening colonoscopy Pap with OB/GYN

## 2022-05-26 ENCOUNTER — Ambulatory Visit (INDEPENDENT_AMBULATORY_CARE_PROVIDER_SITE_OTHER): Payer: BC Managed Care – PPO | Admitting: Family Medicine

## 2022-05-26 VITALS — BP 126/74 | HR 71 | Temp 98.0°F | Resp 16 | Ht 59.0 in | Wt 151.0 lb

## 2022-05-26 DIAGNOSIS — Z1211 Encounter for screening for malignant neoplasm of colon: Secondary | ICD-10-CM

## 2022-05-26 DIAGNOSIS — Z1231 Encounter for screening mammogram for malignant neoplasm of breast: Secondary | ICD-10-CM | POA: Diagnosis not present

## 2022-05-26 DIAGNOSIS — Z0000 Encounter for general adult medical examination without abnormal findings: Secondary | ICD-10-CM | POA: Diagnosis not present

## 2022-05-26 DIAGNOSIS — I159 Secondary hypertension, unspecified: Secondary | ICD-10-CM

## 2022-05-26 DIAGNOSIS — R252 Cramp and spasm: Secondary | ICD-10-CM | POA: Diagnosis not present

## 2022-05-26 DIAGNOSIS — E782 Mixed hyperlipidemia: Secondary | ICD-10-CM | POA: Diagnosis not present

## 2022-05-26 DIAGNOSIS — E6609 Other obesity due to excess calories: Secondary | ICD-10-CM

## 2022-05-26 DIAGNOSIS — R739 Hyperglycemia, unspecified: Secondary | ICD-10-CM

## 2022-05-26 DIAGNOSIS — K219 Gastro-esophageal reflux disease without esophagitis: Secondary | ICD-10-CM | POA: Insufficient documentation

## 2022-05-26 DIAGNOSIS — E282 Polycystic ovarian syndrome: Secondary | ICD-10-CM

## 2022-05-26 MED ORDER — OMEPRAZOLE 40 MG PO CPDR: 40.0000 mg | DELAYED_RELEASE_CAPSULE | Freq: Every day | ORAL | 1 refills | Status: DC

## 2022-05-26 MED ORDER — WEGOVY 2.4 MG/0.75ML ~~LOC~~ SOAJ: 2.4000 mg | SUBCUTANEOUS | 5 refills | Status: DC

## 2022-05-26 MED ORDER — CHLORTHALIDONE 25 MG PO TABS: ORAL_TABLET | ORAL | 1 refills | Status: DC

## 2022-05-26 NOTE — Patient Instructions (Signed)
Preventive Care 46-46 Years Old, Female Preventive care refers to lifestyle choices and visits with your health care provider that can promote health and wellness. Preventive care visits are also called wellness exams. What can I expect for my preventive care visit? Counseling Your health care provider may ask you questions about your: Medical history, including: Past medical problems. Family medical history. Pregnancy history. Current health, including: Menstrual cycle. Method of birth control. Emotional well-being. Home life and relationship well-being. Sexual activity and sexual health. Lifestyle, including: Alcohol, nicotine or tobacco, and drug use. Access to firearms. Diet, exercise, and sleep habits. Work and work environment. Sunscreen use. Safety issues such as seatbelt and bike helmet use. Physical exam Your health care provider will check your: Height and weight. These may be used to calculate your BMI (body mass index). BMI is a measurement that tells if you are at a healthy weight. Waist circumference. This measures the distance around your waistline. This measurement also tells if you are at a healthy weight and may help predict your risk of certain diseases, such as type 2 diabetes and high blood pressure. Heart rate and blood pressure. Body temperature. Skin for abnormal spots. What immunizations do I need?  Vaccines are usually given at various ages, according to a schedule. Your health care provider will recommend vaccines for you based on your age, medical history, and lifestyle or other factors, such as travel or where you work. What tests do I need? Screening Your health care provider may recommend screening tests for certain conditions. This may include: Lipid and cholesterol levels. Diabetes screening. This is done by checking your blood sugar (glucose) after you have not eaten for a while (fasting). Pelvic exam and Pap test. Hepatitis B test. Hepatitis C  test. HIV (human immunodeficiency virus) test. STI (sexually transmitted infection) testing, if you are at risk. Lung cancer screening. Colorectal cancer screening. Mammogram. Talk with your health care provider about when you should start having regular mammograms. This may depend on whether you have a family history of breast cancer. BRCA-related cancer screening. This may be done if you have a family history of breast, ovarian, tubal, or peritoneal cancers. Bone density scan. This is done to screen for osteoporosis. Talk with your health care provider about your test results, treatment options, and if necessary, the need for more tests. Follow these instructions at home: Eating and drinking  Eat a diet that includes fresh fruits and vegetables, whole grains, lean protein, and low-fat dairy products. Take vitamin and mineral supplements as recommended by your health care provider. Do not drink alcohol if: Your health care provider tells you not to drink. You are pregnant, may be pregnant, or are planning to become pregnant. If you drink alcohol: Limit how much you have to 0-1 drink a day. Know how much alcohol is in your drink. In the [place] one drink equals one 12 oz bottle of beer (355 mL), one 5 oz glass of wine (148 mL), or one 1 oz glass of hard liquor (44 mL). Lifestyle Brush your teeth every morning and night with fluoride toothpaste. Floss one time each day. Exercise for at least 30 minutes 5 or more days each week. Do not use any products that contain nicotine or tobacco. These products include cigarettes, chewing tobacco, and vaping devices, such as e-cigarettes. If you need help quitting, ask your health care provider. Do not use drugs. If you are sexually active, practice safe sex. Use a condom or other form of protection to   prevent STIs. If you do not wish to become pregnant, use a form of birth control. If you plan to become pregnant, see your health care provider for a  prepregnancy visit. Take aspirin only as told by your health care provider. Make sure that you understand how much to take and what form to take. Work with your health care provider to find out whether it is safe and beneficial for you to take aspirin daily. Find healthy ways to manage stress, such as: Meditation, yoga, or listening to music. Journaling. Talking to a trusted person. Spending time with friends and family. Minimize exposure to UV radiation to reduce your risk of skin cancer. Safety Always wear your seat belt while driving or riding in a vehicle. Do not drive: If you have been drinking alcohol. Do not ride with someone who has been drinking. When you are tired or distracted. While texting. If you have been using any mind-altering substances or drugs. Wear a helmet and other protective equipment during sports activities. If you have firearms in your house, make sure you follow all gun safety procedures. Seek help if you have been physically or sexually abused. What's next? Visit your health care provider once a year for an annual wellness visit. Ask your health care provider how often you should have your eyes and teeth checked. Stay up to date on all vaccines. This information is not intended to replace advice given to you by your health care provider. Make sure you discuss any questions you have with your health care provider. Document Revised: 02/20/2021 Document Reviewed: 02/20/2021 Elsevier Patient Education  [place].

## 2022-05-26 NOTE — Assessment & Plan Note (Signed)
Avoid offending foods, start probiotics. Do not eat large meals in late evening and consider raising head of bed. Given refill on Omeprazole 40 mg po qd

## 2022-05-27 LAB — LIPID PANEL
Cholesterol: 255 mg/dL — ABNORMAL HIGH (ref 0–200)
HDL: 57.8 mg/dL (ref >39.00)
LDL Cholesterol: 166 mg/dL — ABNORMAL HIGH (ref 0–99)
NonHDL: 197.49
Total CHOL/HDL Ratio: 4
Triglycerides: 159 mg/dL — ABNORMAL HIGH (ref 0.0–149.0)
VLDL: 31.8 mg/dL (ref 0.0–40.0)

## 2022-05-27 LAB — COMPREHENSIVE METABOLIC PANEL
ALT: 13 U/L (ref 0–35)
AST: 16 U/L (ref 0–37)
Albumin: 4.3 g/dL (ref 3.5–5.2)
Alkaline Phosphatase: 53 U/L (ref 39–117)
BUN: 13 mg/dL (ref 6–23)
CO2: 30 mEq/L (ref 19–32)
Calcium: 9.8 mg/dL (ref 8.4–10.5)
Chloride: 96 mEq/L (ref 96–112)
Creatinine, Ser: 0.83 mg/dL (ref 0.40–1.20)
GFR: 84.64 mL/min (ref >60.00)
Glucose, Bld: 84 mg/dL (ref 70–99)
Potassium: 3.7 mEq/L (ref 3.5–5.1)
Sodium: 136 mEq/L (ref 135–145)
Total Bilirubin: 0.5 mg/dL (ref 0.2–1.2)
Total Protein: 7.6 g/dL (ref 6.0–8.3)

## 2022-05-27 LAB — MAGNESIUM: Magnesium: 2 mg/dL (ref 1.5–2.5)

## 2022-05-27 LAB — CBC
HCT: 39.6 % (ref 36.0–46.0)
Hemoglobin: 13.3 g/dL (ref 12.0–15.0)
MCHC: 33.5 g/dL (ref 30.0–36.0)
MCV: 81 fl (ref 78.0–100.0)
Platelets: 349 10*3/uL (ref 150.0–400.0)
RBC: 4.89 Mil/uL (ref 3.87–5.11)
RDW: 13.6 % (ref 11.5–15.5)
WBC: 8.2 10*3/uL (ref 4.0–10.5)

## 2022-05-27 LAB — TSH: TSH: 2.48 u[IU]/mL (ref 0.35–5.50)

## 2022-05-27 LAB — HEMOGLOBIN A1C: Hgb A1c MFr Bld: 5.6 % (ref 4.6–6.5)

## 2022-05-27 LAB — INSULIN, RANDOM: Insulin: 10.4 u[IU]/mL

## 2022-06-10 ENCOUNTER — Ambulatory Visit
Admission: RE | Admit: 2022-06-10 | Discharge: 2022-06-10 | Disposition: A | Discharge status: Home / Self Care | Payer: BC Managed Care – PPO | Source: Ambulatory Visit | Attending: Family Medicine | Admitting: Family Medicine

## 2022-06-10 DIAGNOSIS — Z1231 Encounter for screening mammogram for malignant neoplasm of breast: Secondary | ICD-10-CM

## 2022-06-16 LAB — COLOGUARD: COLOGUARD: NEGATIVE

## 2022-07-16 ENCOUNTER — Telehealth: Payer: Self-pay

## 2022-07-16 NOTE — Telephone Encounter (Signed)
PA initiated via Covermymeds; KEY: [id number]. Awaiting determination.

## 2022-07-16 NOTE — Telephone Encounter (Signed)
PA approved. Effective 07/16/2022 - 07/17/2023

## 2022-11-23 NOTE — Assessment & Plan Note (Signed)
Encouraged DASH or MIND diet, decrease po intake and increase exercise as tolerated. Needs 7-8 hours of sleep nightly. Avoid trans fats, eat small, frequent meals every 4-5 hours with lean proteins, complex carbs and healthy fats. Minimize simple carbs, high fat foods and processed foods tolerating [facility]

## 2022-11-23 NOTE — Assessment & Plan Note (Signed)
Encourage heart healthy diet such as MIND or DASH diet, increase exercise, avoid trans fats, simple carbohydrates and processed foods, consider a krill or fish or flaxseed oil cap daily.  °

## 2022-11-23 NOTE — Assessment & Plan Note (Signed)
Hydrate and monitor 

## 2022-11-23 NOTE — Assessment & Plan Note (Signed)
Well controlled, no changes to meds. Encouraged heart healthy diet such as the DASH diet and exercise as tolerated.  °

## 2022-11-23 NOTE — Assessment & Plan Note (Signed)
hgba1c acceptable, minimize simple carbs. Increase exercise as tolerated.  

## 2022-11-24 ENCOUNTER — Ambulatory Visit: Payer: BC Managed Care – PPO | Admitting: Family Medicine

## 2022-11-24 VITALS — BP 124/76 | HR 87 | Temp 97.6°F | Resp 16 | Ht 59.0 in | Wt 153.6 lb

## 2022-11-24 DIAGNOSIS — E6609 Other obesity due to excess calories: Secondary | ICD-10-CM | POA: Diagnosis not present

## 2022-11-24 DIAGNOSIS — R252 Cramp and spasm: Secondary | ICD-10-CM | POA: Diagnosis not present

## 2022-11-24 DIAGNOSIS — I159 Secondary hypertension, unspecified: Secondary | ICD-10-CM | POA: Diagnosis not present

## 2022-11-24 DIAGNOSIS — R739 Hyperglycemia, unspecified: Secondary | ICD-10-CM

## 2022-11-24 DIAGNOSIS — E782 Mixed hyperlipidemia: Secondary | ICD-10-CM

## 2022-11-24 DIAGNOSIS — E88819 Insulin resistance, unspecified: Secondary | ICD-10-CM

## 2022-11-24 NOTE — Progress Notes (Signed)
Subjective:    Patient ID: Veronica Stokes, female    DOB: 1975/11/02, 47 y.o.   MRN: [id number]  Chief Complaint  Patient presents with   Follow-up    Follow up    HPI Patient is in today for follow up on chronic medical concerns. No recent febrile illness or hospitalizations. Denies CP/palp/SOB/HA/congestion/fevers/GI or GU c/o. Taking meds as prescribed. 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 She has responded well to [facility] and feels better. Past Medical History:  Diagnosis Date   Allergic state 12/18/2008   Qualifier: Diagnosis of  By: Birdie Riddle MD, Belenda Cruise     Asthma    cough variant   Chicken pox as a child   Hyperglycemia 10/28/2015   Hyperlipidemia, mixed 10/28/2015   Insomnia 09/09/2016   Obesity 09/09/2016   Polycystic ovarian syndrome 8-12   Preventative health care 10/28/2015    Past Surgical History:  Procedure Laterality Date   KNEE SURGERY  12-94, 10-07   X 2 left   WISDOM TOOTH EXTRACTION  2001    Family History  Problem Relation Age of Onset   Diabetes Mother        type 2   Hypertension Mother    Diabetes Father        type 2- off of everything since gastric bypass   Mental illness Sister        bipolar, ADHD   Mental illness Maternal Grandmother        manic and bipolar   Stroke Maternal Grandmother    Alcohol abuse Maternal Grandmother    Hearing loss Maternal Grandmother    Heart disease Maternal Grandfather    Heart attack Maternal Grandfather    Diabetes Paternal Grandmother        type 2   Stroke Paternal Grandmother    Thyroid disease Paternal Grandmother    Stroke Paternal Grandfather    Heart attack Paternal Grandfather    Cancer Maternal Aunt 84       breast   Cancer Maternal Aunt 50       breast     Social History   Socioeconomic History   Marital status: Married    Spouse name: Not on file   Number of children: Not on file   Years of education: Not on file   Highest education level: Not  on file  Occupational History   Not on file  Tobacco Use   Smoking status: Never   Smokeless tobacco: Never  Vaping Use   Vaping Use: Never used  Substance and Sexual Activity   Alcohol use: No   Drug use: No   Sexual activity: Yes    Partners: Male    Comment: 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 15 mL 0   albuterol (PROVENTIL) (2.5 MG/3ML) 0.083% nebulizer solution Take 3 mLs (2.5 mg  total) by nebulization every 4 (four) hours as needed for wheezing or shortness of breath. 75 mL 1   albuterol (VENTOLIN HFA) 108 (90 Base) MCG/ACT inhaler Inhale 2 puffs into the lungs every 6 (six) hours as needed for wheezing. 18 g 1   azelastine (ASTELIN) 0.1 % nasal spray Place 2 sprays into both nostrils 2 (two) times daily. Use in each nostril as directed 30 mL 5   Blood Glucose Monitoring Suppl (FREESTYLE FREEDOM LITE) w/Device KIT Use to check sugar once a day.  Dx Code: E16.2 1 kit 0   calcium carbonate (OS-CAL) 600 MG TABS Take 600 mg by mouth daily.     cetirizine (ZYRTEC) 10 MG tablet Take 10 mg by mouth daily.     chlorthalidone (HYGROTON) 25 MG tablet TAKE 1 TABLET(25 MG) BY MOUTH DAILY 90 tablet 1   Cyanocobalamin (B12 FAST DISSOLVE PO) Take by mouth.     glucose blood (FREESTYLE LITE) test strip Use to check sugar once a day.  Dx Code: E16.2 50 each 1   Insulin Pen Needle (PEN NEEDLES) 32G X 4 MM MISC Use with Saxenda once daily 100 each 0   Lancets  (FREESTYLE) lancets Use to check sugar once a day.  Dx Code: E16.2 100 each 1   omeprazole (PRILOSEC) 40 MG capsule Take 1 capsule (40 mg total) by mouth daily. 90 capsule 1   Pediatric Multiple Vitamins (FLINTSTONES MULTIVITAMIN PO) Take 2 tablets by mouth.     Semaglutide-Weight Management (WEGOVY) 2.4 MG/0.75ML SOAJ Inject 2.4 mg into the skin once a week. 3 mL 5   acetaminophen (TYLENOL) 500 MG tablet Take 500 mg by mouth every 6 (six) hours as needed.     No facility-administered medications prior to visit.    Allergies  Allergen Reactions   Corticosteroids Anaphylaxis   Egg-Derived Products    Latex    Metformin And Related Nausea And Vomiting   Penicillins     REACTION: SWELLING   Sulfa Antibiotics Itching    Review of Systems  Constitutional:  Negative for fever and malaise/fatigue.  HENT:  Negative for congestion.   Eyes:  Negative for blurred vision.  Respiratory:  Negative for shortness of breath.   Cardiovascular:  Negative for chest pain, palpitations and leg swelling.  Gastrointestinal:  Negative for abdominal pain, blood in stool and nausea.  Genitourinary:  Negative for dysuria and frequency.  Musculoskeletal:  Negative for falls.  Skin:  Negative for rash.  Neurological:  Negative for dizziness, loss of consciousness and headaches.  Endo/Heme/Allergies:  Negative for environmental allergies.  Psychiatric/Behavioral:  Negative for depression. The patient is not nervous/anxious.        Objective:    Physical Exam Constitutional:      General: She is not in acute distress.    Appearance: Normal appearance. She is well-developed. She is not toxic-appearing.  HENT:     Head: Normocephalic and atraumatic.     Right Ear: External ear normal.     Left Ear: External ear normal.     Nose: Nose normal.  Eyes:     General:        Right eye: No discharge.        Left eye: No discharge.     Conjunctiva/sclera: Conjunctivae normal.  Neck:     Thyroid: No  thyromegaly.  Cardiovascular:     Rate and Rhythm: Normal rate and regular rhythm.     Heart sounds: Normal heart sounds. No murmur heard. Pulmonary:  Effort: Pulmonary effort is normal. No respiratory distress.     Breath sounds: Normal breath sounds.  Abdominal:     General: Bowel sounds are normal.     Palpations: Abdomen is soft.     Tenderness: There is no abdominal tenderness. There is no guarding.  Musculoskeletal:        General: Normal range of motion.     Cervical back: Neck supple.  Lymphadenopathy:     Cervical: No cervical adenopathy.  Skin:    General: Skin is warm and dry.  Neurological:     Mental Status: She is alert and oriented to person, place, and time.  Psychiatric:        Mood and Affect: Mood normal.        Behavior: Behavior normal.        Thought Content: Thought content normal.        Judgment: Judgment normal.     BP 124/76 (BP Location: Right Arm, Patient Position: Sitting, Cuff Size: Normal)   Pulse 87   Temp 97.6 F (36.4 C) (Oral)   Resp 16   Ht 4\' 11"  (1.499 m)   Wt 153 lb 9.6 oz (69.7 kg)   SpO2 96%   BMI 31.02 kg/m  Wt Readings from Last 3 Encounters:  11/24/22 153 lb 9.6 oz (69.7 kg)  05/26/22 151 lb (68.5 kg)  11/29/21 147 lb (66.7 kg)    Diabetic Foot Exam - Simple   No data filed    Lab Results  Component Value Date   WBC 8.2 05/26/2022   HGB 13.3 05/26/2022   HCT 39.6 05/26/2022   PLT 349.0 05/26/2022   GLUCOSE 84 05/26/2022   CHOL 255 (H) 05/26/2022   TRIG 159.0 (H) 05/26/2022   HDL 57.80 05/26/2022   LDLDIRECT 169.0 01/09/2020   LDLCALC 166 (H) 05/26/2022   ALT 13 05/26/2022   AST 16 05/26/2022   NA 136 05/26/2022   K 3.7 05/26/2022   CL 96 05/26/2022   CREATININE 0.83 05/26/2022   BUN 13 05/26/2022   CO2 30 05/26/2022   TSH 2.48 05/26/2022   HGBA1C 5.6 05/26/2022    Lab Results  Component Value Date   TSH 2.48 05/26/2022   Lab Results  Component Value Date   WBC 8.2 05/26/2022   HGB 13.3  05/26/2022   HCT 39.6 05/26/2022   MCV 81.0 05/26/2022   PLT 349.0 05/26/2022   Lab Results  Component Value Date   NA 136 05/26/2022   K 3.7 05/26/2022   CO2 30 05/26/2022   GLUCOSE 84 05/26/2022   BUN 13 05/26/2022   CREATININE 0.83 05/26/2022   BILITOT 0.5 05/26/2022   ALKPHOS 53 05/26/2022   AST 16 05/26/2022   ALT 13 05/26/2022   PROT 7.6 05/26/2022   ALBUMIN 4.3 05/26/2022   CALCIUM 9.8 05/26/2022   GFR 84.64 05/26/2022   Lab Results  Component Value Date   CHOL 255 (H) 05/26/2022   Lab Results  Component Value Date   HDL 57.80 05/26/2022   Lab Results  Component Value Date   LDLCALC 166 (H) 05/26/2022   Lab Results  Component Value Date   TRIG 159.0 (H) 05/26/2022   Lab Results  Component Value Date   CHOLHDL 4 05/26/2022   Lab Results  Component Value Date   HGBA1C 5.6 05/26/2022       Assessment & Plan:  Hyperglycemia Assessment & Plan: hgba1c acceptable, minimize simple carbs. Increase exercise as tolerated.   Orders: -  Hemoglobin A1c  Hyperlipidemia, mixed Assessment & Plan: Encourage heart healthy diet such as MIND or DASH diet, increase exercise, avoid trans fats, simple carbohydrates and processed foods, consider a krill or fish or flaxseed oil cap daily.    Orders: -     Lipid panel  Leg cramp Assessment & Plan: Hydrate and monitor   Orders: -     Magnesium  Obesity due to excess calories without serious comorbidity, unspecified classification Assessment & Plan: Encouraged DASH or MIND diet, decrease po intake and increase exercise as tolerated. Needs 7-8 hours of sleep nightly. Avoid trans fats, eat small, frequent meals every 4-5 hours with lean proteins, complex carbs and healthy fats. Minimize simple carbs, high fat foods and processed foods tolerating Wegovy   Secondary hypertension Assessment & Plan: Well controlled, no changes to meds. Encouraged heart healthy diet such as the DASH diet and exercise as tolerated.     Orders: -     CBC with Differential/Platelet -     Comprehensive metabolic panel -     TSH  Insulin resistance Assessment & Plan: Patient has responded well to [facility] after not tolerating Saxenda and Metformin.   Orders: -     Insulin, random    Penni Homans, MD

## 2022-11-24 NOTE — Patient Instructions (Signed)
Insulin Resistance  Insulin is a hormone that is made by the pancreas. Insulin allows blood sugar (glucose) to enter the cells in the body. Insulin helps the body use glucose for energy. Normally, the body is insulin sensitive, which means the cells in the body are effective at absorbing glucose. Insulin resistance is when the cells in the body do not respond properly to insulin and are not able to absorb glucose. The pancreas makes more insulin, but over time the body cannot make enough insulin to keep glucose at normal levels. Insulin resistance results in high blood glucose levels (hyperglycemia) and can lead to problems, including: Prediabetes. Type 2 diabetes (diabetes mellitus). Heart disease. High blood pressure (hypertension). Stroke. Polycystic ovary syndrome (PCOS). Nonalcoholic fatty liver disease. What are the causes? The exact cause of insulin resistance is not known. What increases the risk? The following factors may make you more likely to develop insulin resistance: Being overweight or obese, especially if a lot of your weight is in your waist area. Having an inactive (sedentary) lifestyle. Having above-normal glucose levels. Having abnormal cholesterol levels. Having sleep apnea. Being older than age 45. Using steroids. What are the signs or symptoms? This condition usually does not cause symptoms. A waist measurement of more than 35 inches (88.9 cm) for women and more than 40 inches (101.6 cm) for men may be a sign of insulin resistance. How is this diagnosed? There is no test to diagnose insulin resistance. However, your health care provider may diagnose insulin resistance based on: A physical exam. Your medical history. Blood tests that check your blood glucose level. How is this treated? Insulin resistance is treated with nutrition and lifestyle changes. These changes may include: Eating a healthy balance of nutritious foods. Getting more physical  activity. Maintaining a healthy weight. Stopping the use of any tobacco products. Your health care provider will work with you to change your nutrition and lifestyle as needed. In some cases, treatment may also include medicine to improve your insulin sensitivity. Follow these instructions at home: Activity Be physically active. Do moderate-intensity physical activity for at least 30 minutes on 5 or more days of the week, or as told by your health care provider. This could include brisk walking, biking, or water aerobics. Ask your health care provider what activities are safe for you. A mix of physical activities may be best, such as walking, swimming, biking, and strength training. Eating and drinking  Follow a healthy meal plan. This includes eating: Lean proteins. Complex carbohydrates. Examples of these include whole grains, starchy vegetables (potatoes, corn, peas), and beans. Fresh fruits and vegetables. Low-fat dairy products. Healthy fats. Follow instructions from your health care provider about eating or drinking restrictions. Make an appointment to see a diet and nutrition specialist (registered dietitian) to help you create a healthy eating plan. General instructions Check your blood glucose levels as told by your health care provider. Take over-the-counter and prescription medicines only as told by your health care provider. Lose weight as told by your health care provider. Losing 5-7% of your body weight can reverse insulin resistance. Your health care provider can determine how much weight loss is best for you and can help you lose weight safely. Do not use any products that contain nicotine or tobacco. These products include cigarettes, chewing tobacco, and vaping devices, such as e-cigarettes. If you need help quitting, ask your health care provider. Keep all follow-up visits. This is important. Contact a health care provider if: You have trouble losing   weight or  maintaining your goal weight. You gain weight. You have trouble following your prescribed meal plan. You have trouble exercising more. Summary Insulin resistance occurs when cells in the body do not respond properly to insulin and are not able to absorb blood sugar (glucose). The body makes more insulin, but over time the body cannot make enough insulin to keep blood sugar at normal levels. Insulin resistance is treated with nutrition and lifestyle changes, including eating a healthy balance of nutritious foods, getting more physical activity, and maintaining a healthy weight. Your health care provider will work with you to change your nutrition and lifestyle as needed. Treatment may also include medicine to improve your insulin sensitivity. Check your blood glucose levels as told by your health care provider. Keep all follow-up visits. This is important. This information is not intended to replace advice given to you by your health care provider. Make sure you discuss any questions you have with your health care provider. Document Revised: 05/14/2020 Document Reviewed: 05/14/2020 Elsevier Patient Education  2023 Elsevier Inc.  

## 2022-11-24 NOTE — Assessment & Plan Note (Signed)
Patient has responded well to [facility] after not tolerating Saxenda and Metformin.

## 2022-11-25 LAB — INSULIN, RANDOM: Insulin: 12.9 u[IU]/mL

## 2022-11-25 LAB — LIPID PANEL
Cholesterol: 258 mg/dL — ABNORMAL HIGH (ref 0–200)
HDL: 51 mg/dL (ref >39.00)
NonHDL: 206.98
Total CHOL/HDL Ratio: 5
Triglycerides: 284 mg/dL — ABNORMAL HIGH (ref 0.0–149.0)
VLDL: 56.8 mg/dL — ABNORMAL HIGH (ref 0.0–40.0)

## 2022-11-25 LAB — LDL CHOLESTEROL, DIRECT: Direct LDL: 164 mg/dL

## 2022-11-25 LAB — CBC WITH DIFFERENTIAL/PLATELET
Basophils Absolute: 0.1 10*3/uL (ref 0.0–0.1)
Basophils Relative: 1.1 % (ref 0.0–3.0)
Eosinophils Absolute: 0.2 10*3/uL (ref 0.0–0.7)
Eosinophils Relative: 2.7 % (ref 0.0–5.0)
HCT: 39.8 % (ref 36.0–46.0)
Hemoglobin: 13.5 g/dL (ref 12.0–15.0)
Lymphocytes Relative: 25.7 % (ref 12.0–46.0)
Lymphs Abs: 2 10*3/uL (ref 0.7–4.0)
MCHC: 33.8 g/dL (ref 30.0–36.0)
MCV: 81.9 fl (ref 78.0–100.0)
Monocytes Absolute: 0.8 10*3/uL (ref 0.1–1.0)
Monocytes Relative: 10 % (ref 3.0–12.0)
Neutro Abs: 4.6 10*3/uL (ref 1.4–7.7)
Neutrophils Relative %: 60.5 % (ref 43.0–77.0)
Platelets: 342 10*3/uL (ref 150.0–400.0)
RBC: 4.86 Mil/uL (ref 3.87–5.11)
RDW: 13.5 % (ref 11.5–15.5)
WBC: 7.7 10*3/uL (ref 4.0–10.5)

## 2022-11-25 LAB — COMPREHENSIVE METABOLIC PANEL
ALT: 15 U/L (ref 0–35)
AST: 15 U/L (ref 0–37)
Albumin: 4.3 g/dL (ref 3.5–5.2)
Alkaline Phosphatase: 56 U/L (ref 39–117)
BUN: 17 mg/dL (ref 6–23)
CO2: 31 mEq/L (ref 19–32)
Calcium: 9.5 mg/dL (ref 8.4–10.5)
Chloride: 96 mEq/L (ref 96–112)
Creatinine, Ser: 0.87 mg/dL (ref 0.40–1.20)
GFR: 79.71 mL/min (ref >60.00)
Glucose, Bld: 79 mg/dL (ref 70–99)
Potassium: 3.3 mEq/L — ABNORMAL LOW (ref 3.5–5.1)
Sodium: 139 mEq/L (ref 135–145)
Total Bilirubin: 0.5 mg/dL (ref 0.2–1.2)
Total Protein: 6.8 g/dL (ref 6.0–8.3)

## 2022-11-25 LAB — HEMOGLOBIN A1C: Hgb A1c MFr Bld: 5.6 % (ref 4.6–6.5)

## 2022-11-25 LAB — TSH: TSH: 2.03 u[IU]/mL (ref 0.35–5.50)

## 2022-11-25 LAB — MAGNESIUM: Magnesium: 2.1 mg/dL (ref 1.5–2.5)

## 2022-11-26 ENCOUNTER — Other Ambulatory Visit: Payer: Self-pay

## 2022-11-26 DIAGNOSIS — I159 Secondary hypertension, unspecified: Secondary | ICD-10-CM

## 2022-12-22 ENCOUNTER — Encounter: Payer: Self-pay | Admitting: Family Medicine

## 2022-12-23 ENCOUNTER — Other Ambulatory Visit: Payer: Self-pay

## 2022-12-23 ENCOUNTER — Telehealth: Payer: Self-pay

## 2022-12-23 MED ORDER — TIRZEPATIDE 2.5 MG/0.5ML ~~LOC~~ SOAJ: 2.5000 mg | SUBCUTANEOUS | 3 refills | Status: DC

## 2022-12-23 NOTE — Telephone Encounter (Signed)
Pt called to schedule vv with Dr. Abner Greenspan regarding issues with her medication but Earleen Reaper is showing July as earliest appt. Advised that mychart is correct as Dr. Abner Greenspan does not have any openings prior to July and offered one of her NPs. Appt scheduled tomorrow with Hyman Hopes. Pt said that the message she received from Dr. Mariel Aloe assistant does not make any sense because if she was diabetic she would be approved for the medication.Pt is frustrated and would like to discuss what options she has so she does not have to go without her medicine. Please see pt follow up message from today regarding what else she can take to maintain her insulin levels.

## 2022-12-23 NOTE — Telephone Encounter (Signed)
Sent Mounjaro in to pharmacy per  Dr.Blyth.

## 2022-12-23 NOTE — Telephone Encounter (Signed)
Mounjaro not covered- Pt does not have type 2 diabetes.

## 2022-12-23 NOTE — Telephone Encounter (Signed)
Sent pt mychart message regarding PA

## 2022-12-24 ENCOUNTER — Telehealth (INDEPENDENT_AMBULATORY_CARE_PROVIDER_SITE_OTHER): Payer: BC Managed Care – PPO | Admitting: Family Medicine

## 2022-12-24 ENCOUNTER — Encounter: Payer: Self-pay | Admitting: Family Medicine

## 2022-12-24 DIAGNOSIS — R739 Hyperglycemia, unspecified: Secondary | ICD-10-CM | POA: Diagnosis not present

## 2022-12-24 DIAGNOSIS — E88819 Insulin resistance, unspecified: Secondary | ICD-10-CM

## 2022-12-24 DIAGNOSIS — E282 Polycystic ovarian syndrome: Secondary | ICD-10-CM | POA: Diagnosis not present

## 2022-12-24 NOTE — Progress Notes (Signed)
Wants to discuss alternative medication to help control her sugars.  Not a diabetic. Last A1C - 5.6 Denied for [facility].  Wegovy covered but still 1300$ a month.  Metformin caused GI upset.

## 2022-12-24 NOTE — Progress Notes (Signed)
Virtual Video Visit via MyChart Note  I connected with  Michiel Sites Savidge on 12/24/22 at 10:00 AM EDT by the video enabled telemedicine application for MyChart, and verified that I am speaking with the correct person using two identifiers.   I introduced myself as a Publishing rights manager with the practice. We discussed the limitations of evaluation and management by telemedicine and the availability of in person appointments. The patient expressed understanding and agreed to proceed.  Participating parties in this visit include: The patient and the nurse practitioner listed.  The patient is: At home I am: In the office - [facility]  Subjective:    CC:  Chief Complaint  Patient presents with   Medication Problem    HPI: Shanetta Nicolls is a 47 y.o. year old female presenting today via MyChart today for medication concern..  She has been on [facility] for close to 2 years now for insulin resistance. She has a Office manager and they stopped covering it as of 12/08/22. States she is now being told it will be $1300/month. She talked to her insurance and they advised she try for [facility], but was denied because she is not diabetic. States they told her to have provider write an appeal letter for either the [facility] or Mounjaro. States pharmacy gave her a coupon for [facility] for 4 months. She would like to start by trying to get the [facility] since she has done well on this and is not diabetic. Requesting letter be faxed to [phone]. She has not been able to tolerate metformin or metformin XR due to significant GI side effects.      Past medical history, Surgical history, Family history not pertinant except as noted below, Social history, Allergies, and medications have been entered into the medical record, reviewed, and corrections made.   Review of Systems:  All review of systems negative except what is listed in the HPI   Objective:     General:  Speaking clearly in complete sentences. Absent shortness of breath noted.   Alert and oriented x3.   Normal judgment.  Absent acute distress.   Impression and Recommendations:    1. Polycystic ovarian syndrome  2. Insulin resistance  3. Hyperglycemia  Advised patient that I would write the appeal letter, but could not guarantee this will work. She is understandable.      I discussed the assessment and treatment plan with the patient. The patient was provided an opportunity to ask questions and all were answered. The patient agreed with the plan and demonstrated an understanding of the instructions.   The patient was advised to call back or seek an in-person evaluation if the symptoms worsen or if the condition fails to improve as anticipated.  Follow-up with PCP as scheduled or sooner if needed.    Clayborne Dana, NP

## 2022-12-25 ENCOUNTER — Telehealth: Payer: Self-pay | Admitting: Family Medicine

## 2022-12-25 ENCOUNTER — Encounter: Payer: Self-pay | Admitting: Family Medicine

## 2022-12-25 NOTE — Telephone Encounter (Addendum)
   PA sent w/ last OV note. Awaiting determination.

## 2022-12-25 NOTE — Telephone Encounter (Signed)
Spoke with patient regarding medications and mychart messages.  Apologized for confusion of mychart messages.  Reviewing chart, PA for [facility] not initiated due to not being diabetic. Patient states insurance advised her in [facility] appeals letter, it must state patient is taking for "insulin resistance to control insulin levels".  Patient currently has appeals letter and form for [facility] completed by Hyman Hopes, NP faxed to insurance today.  Patient will wait on decision on Wegovy, in the meantime requesting PA be completed for [facility] including that she takes for insulin resistance.

## 2022-12-25 NOTE — Telephone Encounter (Signed)
Patient called to speak with a manager regarding her prescription for mounjaro. Pt said that she was told a prior authorization was sent in for [facility] and it was denied because she is not diabetic. Pt said she spoke with [employer] and they have not received it. Pt said she has spoken to CMA in [place] and the replies do not make sense and it show he is not listening. Pt said she cannot tolerate metformin (listed as an allergy) or actos and she has told the CMA that over the phone and yet those are the options she is giving. Patient said she is now without medicine.   Pt also said that there is a form she needs to fill out to go along with the letter that Hyman Hopes wrote for her. Pt sent this form while we were on the phone. Form placed in Taylor's box to go along with the letter that is supposed to be sent to insurance.    Please call patient back at [phone]

## 2022-12-25 NOTE — Telephone Encounter (Addendum)
PA initiated via Covermymeds; KEY: [id number]. Awaiting question response.

## 2022-12-26 NOTE — Telephone Encounter (Signed)
PA denied. Coverage for [facility] requires type 2 diabetes diagnosis.

## 2022-12-29 NOTE — Telephone Encounter (Signed)
Urgent appeal for [facility] faxed today,

## 2022-12-31 NOTE — Telephone Encounter (Signed)
Spoke with Conrad Sheridan with [employer] and he stated that the appeal has been received and can take up to 30 calendar days.

## 2023-01-03 ENCOUNTER — Other Ambulatory Visit: Payer: Self-pay | Admitting: Family Medicine

## 2023-01-12 NOTE — Telephone Encounter (Signed)
Called and checked status of appeal.  They stated that it was still pending.  They should have an answer by 01/28/23

## 2023-01-23 ENCOUNTER — Ambulatory Visit: Payer: BC Managed Care – PPO | Admitting: Family Medicine

## 2023-01-23 ENCOUNTER — Encounter: Payer: Self-pay | Admitting: Family Medicine

## 2023-01-23 VITALS — BP 129/81 | HR 100 | Temp 97.9°F | Ht 59.0 in | Wt 152.0 lb

## 2023-01-23 DIAGNOSIS — R1033 Periumbilical pain: Secondary | ICD-10-CM | POA: Diagnosis not present

## 2023-01-23 DIAGNOSIS — Q899 Congenital malformation, unspecified: Secondary | ICD-10-CM

## 2023-01-23 MED ORDER — MUPIROCIN 2 % EX OINT: 1.0000 | TOPICAL_OINTMENT | Freq: Two times a day (BID) | CUTANEOUS | 1 refills | Status: AC

## 2023-01-23 NOTE — Progress Notes (Signed)
   Acute Office Visit  Subjective:     Patient ID: Veronica Stokes, female    DOB: [birth year], 47 y.o.   MRN: [id number]  Chief Complaint  Patient presents with   Wound Infection    HPI Patient is in today for umbilical pain/drainage.   Patient states that on Sunday she noticed her umbilicus was sore. States she had been wearing pants with a button so she thought was the problem, but the discomfort persisted the next few days. On Tuesday, she started noticing some oozing from the belly button and by Wednesday she had to start wearing a bandaid because of pus/drainage. States it has slowed down, but she still sees some drainage at time. The umbilicus is still tender to touch and mildly red. She denies any spreading erythema/streaking, inflammation, abscess, abdominal pain, fevers, chills, body aches.     ROS All review of systems negative except what is listed in the HPI      Objective:    BP 129/81   Pulse 100   Temp 97.9 F (36.6 C) (Oral)   Ht 4\' 11"  (1.499 m)   Wt 152 lb (68.9 kg)   SpO2 99%   BMI 30.70 kg/m    Physical Exam Vitals reviewed.  Constitutional:      Appearance: Normal appearance.  Abdominal:     General: Abdomen is flat. Bowel sounds are normal.     Palpations: Abdomen is soft.     Comments: Minimal erythema inside umbilicus with scant milky discharge (cultured), and tenderness to palpation; no palpable masses, induration, spreading erythema, warmth, lesions  Skin:    General: Skin is warm and dry.  Neurological:     Mental Status: She is alert and oriented to person, place, and time.  Psychiatric:        Mood and Affect: Mood normal.        Behavior: Behavior normal.        Thought Content: Thought content normal.        Judgment: Judgment normal.     No results found for any visits on 01/23/23.      Assessment & Plan:   Problem List Items Addressed This Visit   None Visit Diagnoses     Umbilical pain    -  Primary Mild  presentation on exam. Scant milky drainage sent for wound culture. Recommend keeping clean, dry, and applying mupirocin ointment.  Patient aware of signs/symptoms requiring further/urgent evaluation.     Relevant Medications   mupirocin ointment (BACTROBAN) 2 %   Other Relevant Orders   WOUND CULTURE       Meds ordered this encounter  Medications   mupirocin ointment (BACTROBAN) 2 %    Sig: Apply 1 Application topically 2 (two) times daily.    Dispense:  22 g    Refill:  1    Order Specific Question:   Supervising Provider    Answer:   Danise Edge A [[id number]]    Return if symptoms worsen or fail to improve.  Clayborne Dana, NP

## 2023-01-26 MED ORDER — DOXYCYCLINE HYCLATE 100 MG PO TABS: 100.0000 mg | ORAL_TABLET | Freq: Two times a day (BID) | ORAL | 0 refills | Status: AC

## 2023-01-27 LAB — WOUND CULTURE
MICRO NUMBER:: 14971355
SPECIMEN QUALITY:: ADEQUATE

## 2023-01-30 ENCOUNTER — Encounter: Payer: Self-pay | Admitting: Family Medicine

## 2023-04-23 ENCOUNTER — Encounter (INDEPENDENT_AMBULATORY_CARE_PROVIDER_SITE_OTHER): Payer: Self-pay

## 2023-06-02 ENCOUNTER — Encounter: Payer: Self-pay | Admitting: Family Medicine

## 2023-06-02 ENCOUNTER — Ambulatory Visit: Payer: BC Managed Care – PPO | Admitting: Family Medicine

## 2023-06-02 VITALS — BP 122/72 | HR 92 | Temp 98.1°F | Resp 16 | Ht 59.0 in | Wt 167.8 lb

## 2023-06-02 DIAGNOSIS — Z0000 Encounter for general adult medical examination without abnormal findings: Secondary | ICD-10-CM

## 2023-06-02 DIAGNOSIS — R739 Hyperglycemia, unspecified: Secondary | ICD-10-CM | POA: Diagnosis not present

## 2023-06-02 DIAGNOSIS — F988 Other specified behavioral and emotional disorders with onset usually occurring in childhood and adolescence: Secondary | ICD-10-CM

## 2023-06-02 DIAGNOSIS — E88819 Insulin resistance, unspecified: Secondary | ICD-10-CM

## 2023-06-02 DIAGNOSIS — E6609 Other obesity due to excess calories: Secondary | ICD-10-CM

## 2023-06-02 DIAGNOSIS — I159 Secondary hypertension, unspecified: Secondary | ICD-10-CM

## 2023-06-02 DIAGNOSIS — E782 Mixed hyperlipidemia: Secondary | ICD-10-CM | POA: Diagnosis not present

## 2023-06-02 DIAGNOSIS — Z6833 Body mass index (BMI) 33.0-33.9, adult: Secondary | ICD-10-CM

## 2023-06-02 MED ORDER — CHLORTHALIDONE 25 MG PO TABS: ORAL_TABLET | ORAL | 3 refills | Status: DC

## 2023-06-02 MED ORDER — OMEPRAZOLE 40 MG PO CPDR: 40.0000 mg | DELAYED_RELEASE_CAPSULE | Freq: Every day | ORAL | 1 refills | Status: DC

## 2023-06-02 NOTE — Progress Notes (Signed)
Subjective:    Patient ID: Dot Been, female    DOB: [birth year], 47 y.o.   MRN: [id number]  Chief Complaint  Patient presents with   Annual Exam    Annual Exam    HPI Discussed the use of AI scribe software for clinical note transcription with the patient, who gave verbal consent to proceed.  History of Present Illness        Patient is a 47 year old female who is in today for annual preventative exam and for follow up on chronic medical concerns. No recent febrile illness or hospitalizations. Denies CP/palp/SOB/HA/congestion/fevers/GI or GU c/o. Taking meds as prescribed. Work has been very stressful but she feels she is managing it well. She had a previous bad reaction to the COVID vaccine with severe swelling in arm and axilla so she will not proceed with vaccine this fall. She also has an egg allergy so cannot take the typical flu shot and she does not trust that her insurance will cover the egg free version    Past Medical History:  Diagnosis Date   Allergic state 12/18/2008   Qualifier: Diagnosis of  By: Beverely Low MD, Natalia Leatherwood     Asthma    cough variant   Chicken pox as a child   Hyperglycemia 10/28/2015   Hyperlipidemia, mixed 10/28/2015   Insomnia 09/09/2016   Obesity 09/09/2016   Polycystic ovarian syndrome 8-12   Preventative health care 10/28/2015    Past Surgical History:  Procedure Laterality Date   KNEE SURGERY  12-94, 10-07   X 2 left   WISDOM TOOTH EXTRACTION  2001    Family History  Problem Relation Age of Onset   Diabetes Mother        type 2   Hypertension Mother    Diabetes Father        type 2- off of everything since gastric bypass   Mental illness Sister        bipolar, ADHD   Mental illness Maternal Grandmother        manic and bipolar   Stroke Maternal Grandmother    Alcohol abuse Maternal Grandmother    Hearing loss Maternal Grandmother    Heart disease Maternal Grandfather    Heart attack Maternal Grandfather    Diabetes  Paternal Grandmother        type 2   Stroke Paternal Grandmother    Thyroid disease Paternal Grandmother    Stroke Paternal Grandfather    Heart attack Paternal Grandfather    Cancer Maternal Aunt 50       breast   Cancer Maternal Aunt 50       breast     Social History   Socioeconomic History   Marital status: Married    Spouse name: Not on file   Number of children: Not on file   Years of education: Not on file   Highest education level: Not on file  Occupational History   Not on file  Tobacco Use   Smoking status: Never   Smokeless tobacco: Never  Vaping Use   Vaping status: Never Used  Substance and Sexual Activity   Alcohol use: No   Drug use: No   Sexual activity: Yes    Partners: Male    Comment: 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, teaches public safety and works with [employer]  Other Topics Concern   Not on file  Social History Narrative   Not on file   Social Determinants of Health  Financial Resource Strain: Not on file  Food Insecurity: No Food Insecurity (07/08/2021)   Received from [facility], [facility]   Hunger Vital Sign    Worried About Running Out of Food in the Last Year: Never true    Ran Out of Food in the Last Year: Never true  Transportation Needs: Not on file  Physical Activity: Not on file  Stress: Not on file  Social Connections: Unknown (01/21/2022)   Received from [facility], [facility]   Social Network    Social Network: Not on file  Intimate Partner Violence: Unknown (12/13/2021)   Received from [facility], [facility]   HITS    Physically Hurt: Not on file    Insult or Talk Down To: Not on file    Threaten Physical Harm: Not on file    Scream or Curse: Not on file    Outpatient Medications Prior to Visit  Medication Sig Dispense Refill   albuterol (PROVENTIL) (2.5 MG/3ML) 0.083% nebulizer solution Take 3 mLs (2.5 mg total) by nebulization every 4 (four) hours as needed for wheezing or shortness of breath. 75 mL 1    albuterol (VENTOLIN HFA) 108 (90 Base) MCG/ACT inhaler Inhale 2 puffs into the lungs every 6 (six) hours as needed for wheezing. 18 g 1   azelastine (ASTELIN) 0.1 % nasal spray Place 2 sprays into both nostrils 2 (two) times daily. Use in each nostril as directed 30 mL 5   Blood Glucose Monitoring Suppl (FREESTYLE FREEDOM LITE) w/Device KIT Use to check sugar once a day.  Dx Code: E16.2 1 kit 0   calcium carbonate (OS-CAL) 600 MG TABS Take 600 mg by mouth daily.     cetirizine (ZYRTEC) 10 MG tablet Take 10 mg by mouth daily.     Cyanocobalamin (B12 FAST DISSOLVE PO) Take by mouth.     glucose blood (FREESTYLE LITE) test strip Use to check sugar once a day.  Dx Code: E16.2 50 each 1   Insulin Pen Needle (PEN NEEDLES) 32G X 4 MM MISC Use with Saxenda once daily 100 each 0   Lancets (FREESTYLE) lancets Use to check sugar once a day.  Dx Code: E16.2 100 each 1   mupirocin ointment (BACTROBAN) 2 % Apply 1 Application topically 2 (two) times daily. 22 g 1   Pediatric Multiple Vitamins (FLINTSTONES MULTIVITAMIN PO) Take 2 tablets by mouth.     chlorthalidone (HYGROTON) 25 MG tablet TAKE 1 TABLET(25 MG) BY MOUTH DAILY 90 tablet 1   omeprazole (PRILOSEC) 40 MG capsule Take 1 capsule (40 mg total) by mouth daily. 90 capsule 1   Semaglutide-Weight Management (WEGOVY) 2.4 MG/0.75ML SOAJ Inject 2.4 mg into the skin once a week. 3 mL 5   tirzepatide (MOUNJARO) 2.5 MG/0.5ML Pen Inject 2.5 mg into the skin once a week. 2 mL 3   No facility-administered medications prior to visit.    Allergies  Allergen Reactions   Corticosteroids Anaphylaxis   Covid-19 (Mrna) Vaccine Swelling   Egg-Derived Products    Latex    Metformin And Related Nausea And Vomiting   Penicillins     REACTION: SWELLING   Sulfa Antibiotics Itching    Review of Systems  Constitutional:  Positive for malaise/fatigue. Negative for chills and fever.  HENT:  Negative for congestion and hearing loss.   Eyes:  Negative for discharge.   Respiratory:  Negative for cough, sputum production and shortness of breath.   Cardiovascular:  Negative for chest pain, palpitations and leg  swelling.  Gastrointestinal:  Negative for abdominal pain, blood in stool, constipation, diarrhea, heartburn, nausea and vomiting.  Genitourinary:  Negative for dysuria, frequency, hematuria and urgency.  Musculoskeletal:  Negative for back pain, falls and myalgias.  Skin:  Negative for rash.  Neurological:  Negative for dizziness, sensory change, loss of consciousness, weakness and headaches.  Endo/Heme/Allergies:  Negative for environmental allergies. Does not bruise/bleed easily.  Psychiatric/Behavioral:  Negative for depression and suicidal ideas. The patient is nervous/anxious. The patient does not have insomnia.        Objective:    Physical Exam Constitutional:      General: She is not in acute distress.    Appearance: Normal appearance. She is not diaphoretic.  HENT:     Head: Normocephalic and atraumatic.     Right Ear: Tympanic membrane, ear canal and external ear normal.     Left Ear: Tympanic membrane, ear canal and external ear normal.     Nose: Nose normal.     Mouth/Throat:     Mouth: Mucous membranes are moist.     Pharynx: Oropharynx is clear. No oropharyngeal exudate.  Eyes:     General: No scleral icterus.       Right eye: No discharge.        Left eye: No discharge.     Conjunctiva/sclera: Conjunctivae normal.     Pupils: Pupils are equal, round, and reactive to light.  Neck:     Thyroid: No thyromegaly.  Cardiovascular:     Rate and Rhythm: Normal rate and regular rhythm.     Heart sounds: Normal heart sounds. No murmur heard. Pulmonary:     Effort: Pulmonary effort is normal. No respiratory distress.     Breath sounds: Normal breath sounds. No wheezing or rales.  Abdominal:     General: Bowel sounds are normal. There is no distension.     Palpations: Abdomen is soft. There is no mass.     Tenderness: There is  no abdominal tenderness.  Musculoskeletal:        General: No tenderness. Normal range of motion.     Cervical back: Normal range of motion and neck supple.  Lymphadenopathy:     Cervical: No cervical adenopathy.  Skin:    General: Skin is warm and dry.     Findings: No rash.  Neurological:     General: No focal deficit present.     Mental Status: She is alert and oriented to person, place, and time.     Cranial Nerves: No cranial nerve deficit.     Coordination: Coordination normal.     Deep Tendon Reflexes: Reflexes are normal and symmetric. Reflexes normal.  Psychiatric:        Mood and Affect: Mood normal.        Behavior: Behavior normal.        Thought Content: Thought content normal.        Judgment: Judgment normal.     BP 122/72 (BP Location: Left Arm, Patient Position: Sitting, Cuff Size: Normal)   Pulse 92   Temp 98.1 F (36.7 C) (Oral)   Resp 16   Ht 4\' 11"  (1.499 m)   Wt 167 lb 12.8 oz (76.1 kg)   SpO2 97%   BMI 33.89 kg/m  Wt Readings from Last 3 Encounters:  06/02/23 167 lb 12.8 oz (76.1 kg)  01/23/23 152 lb (68.9 kg)  11/24/22 153 lb 9.6 oz (69.7 kg)    Diabetic Foot Exam - Simple  No data filed    Lab Results  Component Value Date   WBC 7.7 11/24/2022   HGB 13.5 11/24/2022   HCT 39.8 11/24/2022   PLT 342.0 11/24/2022   GLUCOSE 79 11/24/2022   CHOL 258 (H) 11/24/2022   TRIG 284.0 (H) 11/24/2022   HDL 51.00 11/24/2022   LDLDIRECT 164.0 11/24/2022   LDLCALC 166 (H) 05/26/2022   ALT 15 11/24/2022   AST 15 11/24/2022   NA 139 11/24/2022   K 3.3 (L) 11/24/2022   CL 96 11/24/2022   CREATININE 0.87 11/24/2022   BUN 17 11/24/2022   CO2 31 11/24/2022   TSH 2.03 11/24/2022   HGBA1C 5.6 11/24/2022    Lab Results  Component Value Date   TSH 2.03 11/24/2022   Lab Results  Component Value Date   WBC 7.7 11/24/2022   HGB 13.5 11/24/2022   HCT 39.8 11/24/2022   MCV 81.9 11/24/2022   PLT 342.0 11/24/2022   Lab Results  Component Value  Date   NA 139 11/24/2022   K 3.3 (L) 11/24/2022   CO2 31 11/24/2022   GLUCOSE 79 11/24/2022   BUN 17 11/24/2022   CREATININE 0.87 11/24/2022   BILITOT 0.5 11/24/2022   ALKPHOS 56 11/24/2022   AST 15 11/24/2022   ALT 15 11/24/2022   PROT 6.8 11/24/2022   ALBUMIN 4.3 11/24/2022   CALCIUM 9.5 11/24/2022   GFR 79.71 11/24/2022   Lab Results  Component Value Date   CHOL 258 (H) 11/24/2022   Lab Results  Component Value Date   HDL 51.00 11/24/2022   Lab Results  Component Value Date   LDLCALC 166 (H) 05/26/2022   Lab Results  Component Value Date   TRIG 284.0 (H) 11/24/2022   Lab Results  Component Value Date   CHOLHDL 5 11/24/2022   Lab Results  Component Value Date   HGBA1C 5.6 11/24/2022       Assessment & Plan:  Hyperlipidemia, mixed Assessment & Plan: Encourage heart healthy diet such as MIND or DASH diet, increase exercise, avoid trans fats, simple carbohydrates and processed foods, consider a krill or fish or flaxseed oil cap daily.    Orders: -     CBC with Differential/Platelet -     Lipid panel -     TSH  Hyperglycemia Assessment & Plan: hgba1c acceptable, minimize simple carbs. Increase exercise as tolerated.   Orders: -     CBC with Differential/Platelet -     Comprehensive metabolic panel -     TSH -     Hemoglobin A1c -     Insulin, random  Attention deficit disorder, unspecified hyperactivity presence Assessment & Plan: Doing well without meds   Obesity due to excess calories without serious comorbidity, unspecified classification Assessment & Plan: Encouraged DASH or MIND diet, decrease po intake and increase exercise as tolerated. Needs 7-8 hours of sleep nightly. Avoid trans fats, eat small, frequent meals every 4-5 hours with lean proteins, complex carbs and healthy fats. Minimize simple carbs, high fat foods and processed foods tolerating Wegovy  Orders: -     CBC with Differential/Platelet  Preventative health care Assessment  & Plan: Patient encouraged to maintain heart healthy diet, regular exercise, adequate sleep. Consider daily probiotics. Take medications as prescribed. Labs ordered and reviewed. Needs repeat diagnostic MGM 06/10/2022 repeat annually. Cologuard 06/2022 repeat in 2026.s/p TAH with OB/GYN consider pelvic exam every 5 years or so  Orders: -     CBC with Differential/Platelet  Insulin resistance  Assessment & Plan: Her insurance has stopped covering her GLP1 and her sugars are trending up. Will continue to monitor and she will maintain a heart healthy diet and stay active.   Secondary hypertension Assessment & Plan: Well controlled, no changes to meds. Encouraged heart healthy diet such as the DASH diet and exercise as tolerated.     Other orders -     Omeprazole; Take 1 capsule (40 mg total) by mouth daily.  Dispense: 90 capsule; Refill: 1 -     Chlorthalidone; TAKE 1 TABLET(25 MG) BY MOUTH DAILY  Dispense: 90 tablet; Refill: 3    Assessment and Plan    Polycystic Ovary Syndrome (PCOS) and Insulin Resistance Patient reports vision changes and weight gain, likely due to fluctuating insulin levels. A1c has increased to 5.8-5.9. Phentermine was prescribed by endocrinologist but has not been effective in weight management. Patient was previously on Ozempic, but has been off since April. -Order A1c and free insulin level today to monitor insulin resistance. -Consider resuming Ozempic if affordable and approved by insurance. -Continue lifestyle modifications including diet and exercise.  Hypertension Stable on Chlorthalidone. -Refill Chlorthalidone for 90 days with 1 year's worth of refills.  Gastroesophageal Reflux Disease (GERD) Stable on Omeprazole. -Refill Omeprazole for 90 days.  General Health Maintenance -Consider pelvic exam every 5 years due to history of hysterectomy with ovaries left in place. -Continue multivitamin and fatty acids for nervous system health. -Encourage  hydration, 4000-8000 steps daily, and stress management. -Plan for tetanus booster in 2025. -Consider shingles vaccine when eligible. -Annual mammogram due next month. -Cologuard due in 3 years. -Schedule follow-up appointments in 6 months and 1 year. If doing well at 6 months, patient may cancel or convert to virtual visit. -Endocrinology follow-up in December.         Danise Edge, MD

## 2023-06-02 NOTE — Assessment & Plan Note (Addendum)
Doing well without meds

## 2023-06-02 NOTE — Assessment & Plan Note (Signed)
Well controlled, no changes to meds. Encouraged heart healthy diet such as the DASH diet and exercise as tolerated.  °

## 2023-06-02 NOTE — Assessment & Plan Note (Signed)
hgba1c acceptable, minimize simple carbs. Increase exercise as tolerated.  

## 2023-06-02 NOTE — Assessment & Plan Note (Signed)
Encourage heart healthy diet such as MIND or DASH diet, increase exercise, avoid trans fats, simple carbohydrates and processed foods, consider a krill or fish or flaxseed oil cap daily.  °

## 2023-06-02 NOTE — Assessment & Plan Note (Addendum)
Patient encouraged to maintain heart healthy diet, regular exercise, adequate sleep. Consider daily probiotics. Take medications as prescribed. Labs ordered and reviewed. Needs repeat diagnostic MGM 06/10/2022 repeat annually. Cologuard 06/2022 repeat in 2026.s/p TAH with OB/GYN consider pelvic exam every 5 years or so

## 2023-06-02 NOTE — Assessment & Plan Note (Signed)
Encouraged DASH or MIND diet, decrease po intake and increase exercise as tolerated. Needs 7-8 hours of sleep nightly. Avoid trans fats, eat small, frequent meals every 4-5 hours with lean proteins, complex carbs and healthy fats. Minimize simple carbs, high fat foods and processed foods tolerating [facility]

## 2023-06-02 NOTE — Patient Instructions (Signed)
4000, 8000 steps  Hydrate with 60-80 ounces, 10 ounces every 1-2    Preventive Care 31-47 Years Old, Female Preventive care refers to lifestyle choices and visits with your health care provider that can promote health and wellness. Old, Female Preventive care refers to lifestyle choices and visits with your health care provider that can promote health and wellness. Old, Female Preventive care refers to lifestyle choices and visits with your health care provider that can promote health and wellness. Preventive care visits are also called wellness exams. What can I expect for my preventive care visit? Counseling Your health care provider may ask you questions about your: Medical history, including: Past medical problems.  Family medical history. Pregnancy history. Current health, including: Menstrual cycle. Method of birth control. Emotional well-being. Home life and relationship well-being. Sexual activity and sexual health. Lifestyle, including: Alcohol, nicotine or tobacco, and drug use. Access to firearms. Diet, exercise, and sleep habits. Work and work Astronomer. Sunscreen use. Safety issues such as seatbelt and bike helmet use. Physical exam Your health care provider will check your: Height and weight. These may be used to calculate your BMI (body mass index). BMI is a measurement that tells if you are at a healthy weight. Waist circumference. This measures the distance around your waistline. This measurement also tells if you are at a healthy weight and may help predict your risk of certain diseases, such as type 2 diabetes and high blood pressure. Heart rate and blood pressure. Body temperature. Skin for abnormal spots. What immunizations do I need?  Vaccines are usually given at various ages, according to a schedule. Your health care provider will recommend vaccines for you based on your age, medical history, and lifestyle or other factors, such as travel or where you work. What tests do I need? Screening Your health care provider may recommend screening tests for certain conditions. This may include: Lipid and cholesterol levels. Diabetes screening. This is done by checking your blood sugar (glucose) after you have not eaten for a  while (fasting). Pelvic exam and Pap test. Hepatitis B test. Hepatitis C test. HIV (human immunodeficiency virus) test. STI (sexually transmitted infection) testing, if you are at risk. Lung cancer screening. Colorectal cancer screening. Mammogram. Talk with your health care provider about when you should start having regular mammograms. This may depend on whether you have a family history of breast cancer. BRCA-related cancer screening. This may be done if you have a family history of breast, ovarian, tubal, or peritoneal cancers. Bone density scan. This is done to screen for osteoporosis. Talk with your health care provider about your test results, treatment options, and if necessary, the need for more tests. Follow these instructions at home: Eating and drinking  Eat a diet that includes fresh fruits and vegetables, whole grains, lean protein, and low-fat dairy products. Take vitamin and mineral supplements as recommended by your health care provider. Do not drink alcohol if: Your health care provider tells you not to drink. You are pregnant, may be pregnant, or are planning to become pregnant. If you drink alcohol: Limit how much you have to 0-1 drink a day. Know how much alcohol is in your drink. In the [place] one drink equals one 12 oz bottle of beer (355 mL), one 5 oz glass of wine (148 mL), or one 1 oz glass of hard liquor (44 mL). Lifestyle Brush your teeth every morning and night with fluoride toothpaste. Floss one time each day. Exercise for at least 30 minutes 5 or more days each week. Do not use any products that contain nicotine or tobacco. These products include cigarettes, chewing tobacco, and vaping devices, such as e-cigarettes. If you need help quitting, ask your health care provider. Do not use drugs. If  you are sexually active, practice safe sex. Use a condom or other form of protection to prevent STIs. If you do not wish to become pregnant, use a form of birth  control. If you plan to become pregnant, see your health care provider for a prepregnancy visit. Take aspirin only as told by your health care provider. Make sure that you understand how much to take and what form to take. Work with your health care provider to find out whether it is safe and beneficial for you to take aspirin daily. Find healthy ways to manage stress, such as: Meditation, yoga, or listening to music. Journaling. Talking to a trusted person. Spending time with friends and family. Minimize exposure to UV radiation to reduce your risk of skin cancer. Safety Always wear your seat belt while driving or riding in a vehicle. Do not drive: If you have been drinking alcohol. Do not ride with someone who has been drinking. When you are tired or distracted. While texting. If you have been using any mind-altering substances or drugs. Wear a helmet and other protective equipment during sports activities. If you have firearms in your house, make sure you follow all gun safety procedures. Seek help if you have been physically or sexually abused. What's next? Visit your health care provider once a year for an annual wellness visit. Ask your health care provider how often you should have your eyes and teeth checked. Stay up to date on all vaccines. This information is not intended to replace advice given to you by your health care provider. Make sure you discuss any questions you have with your health care provider. Document Revised: 02/20/2021 Document Reviewed: 02/20/2021 Elsevier Patient Education  2024 [employer].

## 2023-06-02 NOTE — Assessment & Plan Note (Signed)
Her insurance has stopped covering her GLP1 and her sugars are trending up. Will continue to monitor and she will maintain a heart healthy diet and stay active.

## 2023-06-03 LAB — CBC WITH DIFFERENTIAL/PLATELET
Basophils Absolute: 0.1 10*3/uL (ref 0.0–0.1)
Basophils Relative: 1.1 % (ref 0.0–3.0)
Eosinophils Absolute: 0.4 10*3/uL (ref 0.0–0.7)
Eosinophils Relative: 4.5 % (ref 0.0–5.0)
HCT: 39.3 % (ref 36.0–46.0)
Hemoglobin: 12.7 g/dL (ref 12.0–15.0)
Lymphocytes Relative: 19.3 % (ref 12.0–46.0)
Lymphs Abs: 1.8 10*3/uL (ref 0.7–4.0)
MCHC: 32.2 g/dL (ref 30.0–36.0)
MCV: 84.7 fl (ref 78.0–100.0)
Monocytes Absolute: 0.8 10*3/uL (ref 0.1–1.0)
Monocytes Relative: 8.7 % (ref 3.0–12.0)
Neutro Abs: 6.1 10*3/uL (ref 1.4–7.7)
Neutrophils Relative %: 66.4 % (ref 43.0–77.0)
Platelets: 297 10*3/uL (ref 150.0–400.0)
RBC: 4.64 Mil/uL (ref 3.87–5.11)
RDW: 14.8 % (ref 11.5–15.5)
WBC: 9.2 10*3/uL (ref 4.0–10.5)

## 2023-06-03 LAB — COMPREHENSIVE METABOLIC PANEL
ALT: 19 U/L (ref 0–35)
AST: 15 U/L (ref 0–37)
Albumin: 4.2 g/dL (ref 3.5–5.2)
Alkaline Phosphatase: 53 U/L (ref 39–117)
BUN: 11 mg/dL (ref 6–23)
CO2: 31 mEq/L (ref 19–32)
Calcium: 9.2 mg/dL (ref 8.4–10.5)
Chloride: 100 mEq/L (ref 96–112)
Creatinine, Ser: 0.8 mg/dL (ref 0.40–1.20)
GFR: 87.83 mL/min (ref >60.00)
Glucose, Bld: 80 mg/dL (ref 70–99)
Potassium: 3.5 mEq/L (ref 3.5–5.1)
Sodium: 138 mEq/L (ref 135–145)
Total Bilirubin: 0.4 mg/dL (ref 0.2–1.2)
Total Protein: 7 g/dL (ref 6.0–8.3)

## 2023-06-03 LAB — LIPID PANEL
Cholesterol: 230 mg/dL — ABNORMAL HIGH (ref 0–200)
HDL: 67.3 mg/dL (ref >39.00)
LDL Cholesterol: 134 mg/dL — ABNORMAL HIGH (ref 0–99)
NonHDL: 162.88
Total CHOL/HDL Ratio: 3
Triglycerides: 143 mg/dL (ref 0.0–149.0)
VLDL: 28.6 mg/dL (ref 0.0–40.0)

## 2023-06-03 LAB — TSH: TSH: 3.27 u[IU]/mL (ref 0.35–5.50)

## 2023-06-03 LAB — HEMOGLOBIN A1C: Hgb A1c MFr Bld: 5.8 % (ref 4.6–6.5)

## 2023-06-03 LAB — INSULIN, RANDOM: Insulin: 18.1 u[IU]/mL

## 2023-06-18 ENCOUNTER — Other Ambulatory Visit: Payer: Self-pay | Admitting: Medical Genetics

## 2023-06-18 DIAGNOSIS — Z006 Encounter for examination for normal comparison and control in clinical research program: Secondary | ICD-10-CM

## 2023-09-10 ENCOUNTER — Other Ambulatory Visit: Payer: Self-pay | Admitting: Family Medicine

## 2023-09-10 DIAGNOSIS — Z1231 Encounter for screening mammogram for malignant neoplasm of breast: Secondary | ICD-10-CM

## 2023-09-11 ENCOUNTER — Other Ambulatory Visit (HOSPITAL_COMMUNITY)
Admission: RE | Admit: 2023-09-11 | Discharge: 2023-09-11 | Disposition: A | Discharge status: Home / Self Care | Payer: Self-pay | Source: Ambulatory Visit | Attending: Medical Genetics | Admitting: Medical Genetics

## 2023-09-11 DIAGNOSIS — Z006 Encounter for examination for normal comparison and control in clinical research program: Secondary | ICD-10-CM | POA: Insufficient documentation

## 2023-09-24 ENCOUNTER — Ambulatory Visit
Admission: RE | Admit: 2023-09-24 | Discharge: 2023-09-24 | Disposition: A | Discharge status: Home / Self Care | Payer: Self-pay | Source: Ambulatory Visit | Attending: Family Medicine | Admitting: Family Medicine

## 2023-09-24 DIAGNOSIS — Z1231 Encounter for screening mammogram for malignant neoplasm of breast: Secondary | ICD-10-CM

## 2023-09-28 LAB — GENECONNECT MOLECULAR SCREEN: Genetic Analysis Overall Interpretation: NEGATIVE

## 2023-10-26 ENCOUNTER — Other Ambulatory Visit: Payer: Self-pay | Admitting: Family Medicine

## 2023-11-16 ENCOUNTER — Encounter: Payer: Self-pay | Admitting: Family Medicine

## 2023-11-17 ENCOUNTER — Other Ambulatory Visit: Payer: Self-pay | Admitting: Emergency Medicine

## 2023-11-17 DIAGNOSIS — Z0000 Encounter for general adult medical examination without abnormal findings: Secondary | ICD-10-CM

## 2023-11-17 DIAGNOSIS — E782 Mixed hyperlipidemia: Secondary | ICD-10-CM

## 2023-11-17 DIAGNOSIS — R739 Hyperglycemia, unspecified: Secondary | ICD-10-CM

## 2023-11-17 NOTE — Telephone Encounter (Signed)
 Called patient and set up lab appointment. Orders have been placed

## 2023-11-23 ENCOUNTER — Other Ambulatory Visit (INDEPENDENT_AMBULATORY_CARE_PROVIDER_SITE_OTHER)

## 2023-11-23 ENCOUNTER — Encounter: Payer: Self-pay | Admitting: Family Medicine

## 2023-11-23 DIAGNOSIS — E782 Mixed hyperlipidemia: Secondary | ICD-10-CM

## 2023-11-23 DIAGNOSIS — Z0000 Encounter for general adult medical examination without abnormal findings: Secondary | ICD-10-CM

## 2023-11-23 DIAGNOSIS — R739 Hyperglycemia, unspecified: Secondary | ICD-10-CM

## 2023-11-23 LAB — LIPID PANEL
Cholesterol: 257 mg/dL — ABNORMAL HIGH (ref 0–200)
HDL: 56.9 mg/dL (ref >39.00)
LDL Cholesterol: 157 mg/dL — ABNORMAL HIGH (ref 0–99)
NonHDL: 200.14
Total CHOL/HDL Ratio: 5
Triglycerides: 215 mg/dL — ABNORMAL HIGH (ref 0.0–149.0)
VLDL: 43 mg/dL — ABNORMAL HIGH (ref 0.0–40.0)

## 2023-11-23 LAB — CBC WITH DIFFERENTIAL/PLATELET
Basophils Absolute: 0.1 10*3/uL (ref 0.0–0.1)
Basophils Relative: 0.8 % (ref 0.0–3.0)
Eosinophils Absolute: 0.6 10*3/uL (ref 0.0–0.7)
Eosinophils Relative: 6.1 % — ABNORMAL HIGH (ref 0.0–5.0)
HCT: 38.8 % (ref 36.0–46.0)
Hemoglobin: 12.9 g/dL (ref 12.0–15.0)
Lymphocytes Relative: 21.5 % (ref 12.0–46.0)
Lymphs Abs: 2.1 10*3/uL (ref 0.7–4.0)
MCHC: 33.1 g/dL (ref 30.0–36.0)
MCV: 84.5 fl (ref 78.0–100.0)
Monocytes Absolute: 0.8 10*3/uL (ref 0.1–1.0)
Monocytes Relative: 8.7 % (ref 3.0–12.0)
Neutro Abs: 6.1 10*3/uL (ref 1.4–7.7)
Neutrophils Relative %: 62.9 % (ref 43.0–77.0)
Platelets: 336 10*3/uL (ref 150.0–400.0)
RBC: 4.6 Mil/uL (ref 3.87–5.11)
RDW: 13.8 % (ref 11.5–15.5)
WBC: 9.7 10*3/uL (ref 4.0–10.5)

## 2023-11-23 LAB — COMPREHENSIVE METABOLIC PANEL
ALT: 28 U/L (ref 0–35)
AST: 19 U/L (ref 0–37)
Albumin: 4.3 g/dL (ref 3.5–5.2)
Alkaline Phosphatase: 54 U/L (ref 39–117)
BUN: 12 mg/dL (ref 6–23)
CO2: 31 meq/L (ref 19–32)
Calcium: 9.4 mg/dL (ref 8.4–10.5)
Chloride: 95 meq/L — ABNORMAL LOW (ref 96–112)
Creatinine, Ser: 0.63 mg/dL (ref 0.40–1.20)
GFR: 105.39 mL/min (ref >60.00)
Glucose, Bld: 160 mg/dL — ABNORMAL HIGH (ref 70–99)
Potassium: 3.1 meq/L — ABNORMAL LOW (ref 3.5–5.1)
Sodium: 138 meq/L (ref 135–145)
Total Bilirubin: 0.5 mg/dL (ref 0.2–1.2)
Total Protein: 6.5 g/dL (ref 6.0–8.3)

## 2023-11-23 LAB — HEMOGLOBIN A1C: Hgb A1c MFr Bld: 5.8 % (ref 4.6–6.5)

## 2023-11-23 LAB — TSH: TSH: 3.04 u[IU]/mL (ref 0.35–5.50)

## 2023-11-24 LAB — INSULIN, RANDOM: Insulin: 74.3 u[IU]/mL — ABNORMAL HIGH

## 2023-11-24 MED ORDER — POTASSIUM CHLORIDE CRYS ER 20 MEQ PO TBCR: 20.0000 meq | EXTENDED_RELEASE_TABLET | Freq: Every day | ORAL | 2 refills | Status: DC

## 2023-11-24 NOTE — Addendum Note (Signed)
 Addended by: Maximino Sarin on: 11/24/2023 10:00 AM   Modules accepted: Orders

## 2023-11-29 NOTE — Assessment & Plan Note (Signed)
 Encourage heart healthy diet such as MIND or DASH diet, increase exercise, avoid trans fats, simple carbohydrates and processed foods, consider a krill or fish or flaxseed oil cap daily.

## 2023-11-29 NOTE — Assessment & Plan Note (Signed)
Doing well without meds

## 2023-11-29 NOTE — Assessment & Plan Note (Signed)
 Hydrate and monitor

## 2023-11-29 NOTE — Assessment & Plan Note (Signed)
 hgba1c acceptable, minimize simple carbs. Increase exercise as tolerated.

## 2023-11-30 ENCOUNTER — Ambulatory Visit: Payer: BC Managed Care – PPO | Admitting: Family Medicine

## 2023-12-01 ENCOUNTER — Telehealth (INDEPENDENT_AMBULATORY_CARE_PROVIDER_SITE_OTHER): Admitting: Family Medicine

## 2023-12-01 DIAGNOSIS — R739 Hyperglycemia, unspecified: Secondary | ICD-10-CM | POA: Diagnosis not present

## 2023-12-01 DIAGNOSIS — R252 Cramp and spasm: Secondary | ICD-10-CM | POA: Diagnosis not present

## 2023-12-01 DIAGNOSIS — E782 Mixed hyperlipidemia: Secondary | ICD-10-CM

## 2023-12-01 DIAGNOSIS — E282 Polycystic ovarian syndrome: Secondary | ICD-10-CM

## 2023-12-01 DIAGNOSIS — E161 Other hypoglycemia: Secondary | ICD-10-CM | POA: Diagnosis not present

## 2023-12-01 MED ORDER — LOSARTAN POTASSIUM 50 MG PO TABS: 50.0000 mg | ORAL_TABLET | Freq: Every day | ORAL | 3 refills | Status: DC

## 2023-12-02 ENCOUNTER — Encounter: Payer: Self-pay | Admitting: Family Medicine

## 2023-12-02 ENCOUNTER — Other Ambulatory Visit

## 2023-12-02 NOTE — Progress Notes (Signed)
 MyChart Video Visit    Virtual Visit via Video Note   This patient is at least at moderate risk for complications without adequate follow up. This format is felt to be most appropriate for this patient at this time. Physical exam was limited by quality of the video and audio technology used for the visit. Veronica, Stokes was able to get the patient set up on a video visit.  Patient location: home Patient and provider in visit Provider location: Office  I discussed the limitations of evaluation and management by telemedicine and the availability of in person appointments. The patient expressed understanding and agreed to proceed.  Visit Date: 12/01/2023  Today's healthcare provider: Danise Edge, MD  Subjective:    Patient ID: Veronica Stokes, female    DOB: 1975-12-08, 48 y.o.   MRN: [id number]  Chief Complaint  Patient presents with   Follow-up    HPI Discussed the use of AI scribe software for clinical note transcription with the patient, who gave verbal consent to proceed.  History of Present Illness Veronica Stokes "[name]" is a 48 year old female with PCOS and hyperglycemia who presents with worsening vision and difficulty managing blood sugar levels.  She is experiencing worsening vision, which she attributes to her uncontrolled blood sugar levels. Her vision issues are exacerbated by high blood sugar, causing swelling in the lens. She has not Stokes able to check her blood sugar levels at home due to not having a meter, but her vision is deteriorating.  She has a history of polycystic ovary syndrome (PCOS) and hyperglycemia. Her blood sugar levels have Stokes rising, with her A1c last recorded at 5.8%. She has not Stokes on Victoza since mid-February, which she believes has contributed to her rising blood sugar levels. Her fasting glucose levels have Stokes above 126 mg/dL, with a recent reading of 160 mg/dL.  She has Stokes denied coverage for Victoza by her insurance,  which only covers Mounjaro and Ozempic for diabetes management. She is frustrated with the insurance requirements, which necessitate specific glucose levels for coverage. She is considering dietary adjustments to meet these requirements.  She has a history of cataracts, which she believes are related to her uncontrolled blood sugar levels. She is concerned about the potential need for cataract surgery if her blood sugar remains uncontrolled.  She is currently taking potassium supplements but has difficulty with absorption due to previous stomach surgery. She cannot consume bananas due to an extreme latex allergy, which limits her dietary potassium intake. She is also on chlorthalidone for blood pressure management, which contributes to her low potassium levels.    Past Medical History:  Diagnosis Date   Allergic state 12/18/2008   Qualifier: Diagnosis of  By: Beverely Low MD, Natalia Leatherwood     Asthma    cough variant   Chicken pox as a child   Hyperglycemia 10/28/2015   Hyperlipidemia, mixed 10/28/2015   Insomnia 09/09/2016   Obesity 09/09/2016   Polycystic ovarian syndrome 8-12   Preventative health care 10/28/2015    Past Surgical History:  Procedure Laterality Date   KNEE SURGERY  12-94, 10-07   X 2 left   WISDOM TOOTH EXTRACTION  2001    Family History  Problem Relation Age of Onset   Diabetes Mother        type 2   Hypertension Mother    Diabetes Father        type 2- off of everything since gastric bypass   Mental illness Sister  bipolar, ADHD   Mental illness Maternal Grandmother        manic and bipolar   Stroke Maternal Grandmother    Alcohol abuse Maternal Grandmother    Hearing loss Maternal Grandmother    Heart disease Maternal Grandfather    Heart attack Maternal Grandfather    Diabetes Paternal Grandmother        type 2   Stroke Paternal Grandmother    Thyroid disease Paternal Grandmother    Stroke Paternal Grandfather    Heart attack Paternal Grandfather     Cancer Maternal Aunt 50       breast   Cancer Maternal Aunt 66       breast     Social History   Socioeconomic History   Marital status: Married    Spouse name: Not on file   Number of children: Not on file   Years of education: Not on file   Highest education level: Not on file  Occupational History   Not on file  Tobacco Use   Smoking status: Never   Smokeless tobacco: Never  Vaping Use   Vaping status: Never Used  Substance and Sexual Activity   Alcohol use: No   Drug use: No   Sexual activity: Yes    Partners: Male    Comment: 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, teaches public safety and works with [employer]  Other Topics Concern   Not on file  Social History Narrative   Not on file   Social Drivers of Health   Financial Resource Strain: Not on file  Food Insecurity: Low Risk  (10/05/2023)   Received from [facility]   Hunger Vital Sign    Worried About Running Out of Food in the Last Year: Never true    Ran Out of Food in the Last Year: Never true  Transportation Needs: No Transportation Needs (10/05/2023)   Received from [facility]    In the past 12 months, has lack of reliable transportation kept you from medical appointments, meetings, work or from getting things needed for daily living? : No  Physical Activity: Not on file  Stress: Not on file  Social Connections: Unknown (01/21/2022)   Received from [facility]   Social Network    Social Network: Not on file  Intimate Partner Violence: Unknown (12/13/2021)   Received from [facility]   HITS    Physically Hurt: Not on file    Insult or Talk Down To: Not on file    Threaten Physical Harm: Not on file    Scream or Curse: Not on file    Outpatient Medications Prior to Visit  Medication Sig Dispense Refill   albuterol (PROVENTIL) (2.5 MG/3ML) 0.083% nebulizer solution Take 3 mLs (2.5 mg total) by nebulization every 4 (four) hours as needed for wheezing or shortness  of breath. 75 mL 1   albuterol (VENTOLIN HFA) 108 (90 Base) MCG/ACT inhaler Inhale 2 puffs into the lungs every 6 (six) hours as needed for wheezing. 18 g 1   azelastine (ASTELIN) 0.1 % nasal spray Place 2 sprays into both nostrils 2 (two) times daily. Use in each nostril as directed 30 mL 5   Blood Glucose Monitoring Suppl (FREESTYLE FREEDOM LITE) w/Device KIT Use to check sugar once a day.  Dx Code: E16.2 1 kit 0   calcium carbonate (OS-CAL) 600 MG TABS Take 600 mg by mouth daily.     cetirizine (ZYRTEC) 10 MG tablet Take  10 mg by mouth daily.     Cyanocobalamin (B12 FAST DISSOLVE PO) Take by mouth.     glucose blood (FREESTYLE LITE) test strip Use to check sugar once a day.  Dx Code: E16.2 50 each 1   Insulin Pen Needle (PEN NEEDLES) 32G X 4 MM MISC Use with Saxenda once daily 100 each 0   Lancets (FREESTYLE) lancets Use to check sugar once a day.  Dx Code: E16.2 100 each 1   mupirocin ointment (BACTROBAN) 2 % Apply 1 Application topically 2 (two) times daily. 22 g 1   omeprazole (PRILOSEC) 40 MG capsule TAKE 1 CAPSULE(40 MG) BY MOUTH DAILY 90 capsule 1   Pediatric Multiple Vitamins (FLINTSTONES MULTIVITAMIN PO) Take 2 tablets by mouth.     potassium chloride SA (KLOR-CON M) 20 MEQ tablet Take 1 tablet (20 mEq total) by mouth daily. Take 2 tablets daily for 3 days and decrease 1 tablet daily 35 tablet 2   chlorthalidone (HYGROTON) 25 MG tablet TAKE 1 TABLET(25 MG) BY MOUTH DAILY 90 tablet 3   No facility-administered medications prior to visit.    Allergies  Allergen Reactions   Corticosteroids Anaphylaxis   Covid-19 (Mrna) Vaccine Swelling   Egg-Derived Products    Latex    Metformin And Related Nausea And Vomiting   Penicillins     REACTION: SWELLING   Sulfa Antibiotics Itching    Review of Systems  Constitutional:  Positive for malaise/fatigue. Negative for fever.  HENT:  Negative for congestion.   Eyes:  Positive for blurred vision.  Respiratory:  Negative for shortness of  breath.   Cardiovascular:  Negative for chest pain, palpitations and leg swelling.  Gastrointestinal:  Negative for abdominal pain, blood in stool and nausea.  Genitourinary:  Negative for dysuria and frequency.  Musculoskeletal:  Negative for falls.  Skin:  Negative for rash.  Neurological:  Negative for dizziness, loss of consciousness and headaches.  Endo/Heme/Allergies:  Negative for environmental allergies.  Psychiatric/Behavioral:  Negative for depression. The patient is not nervous/anxious.        Objective:    Physical Exam Constitutional:      General: She is not in acute distress.    Appearance: Normal appearance. She is not ill-appearing or toxic-appearing.  HENT:     Head: Normocephalic and atraumatic.     Right Ear: External ear normal.     Left Ear: External ear normal.     Nose: Nose normal.  Eyes:     General:        Right eye: No discharge.        Left eye: No discharge.  Pulmonary:     Effort: Pulmonary effort is normal.  Skin:    Findings: No rash.  Neurological:     Mental Status: She is alert and oriented to person, place, and time.  Psychiatric:        Behavior: Behavior normal.     There were no vitals taken for this visit. Wt Readings from Last 3 Encounters:  06/02/23 167 lb 12.8 oz (76.1 kg)  01/23/23 152 lb (68.9 kg)  11/24/22 153 lb 9.6 oz (69.7 kg)    Diabetic Foot Exam - Simple   No data filed    Lab Results  Component Value Date   WBC 9.7 11/23/2023   HGB 12.9 11/23/2023   HCT 38.8 11/23/2023   PLT 336.0 11/23/2023   GLUCOSE 160 (H) 11/23/2023   CHOL 257 (H) 11/23/2023   TRIG 215.0 (H) 11/23/2023  HDL 56.90 11/23/2023   LDLDIRECT 164.0 11/24/2022   LDLCALC 157 (H) 11/23/2023   ALT 28 11/23/2023   AST 19 11/23/2023   NA 138 11/23/2023   K 3.1 (L) 11/23/2023   CL 95 (L) 11/23/2023   CREATININE 0.63 11/23/2023   BUN 12 11/23/2023   CO2 31 11/23/2023   TSH 3.04 11/23/2023   HGBA1C 5.8 11/23/2023    Lab Results   Component Value Date   TSH 3.04 11/23/2023   Lab Results  Component Value Date   WBC 9.7 11/23/2023   HGB 12.9 11/23/2023   HCT 38.8 11/23/2023   MCV 84.5 11/23/2023   PLT 336.0 11/23/2023   Lab Results  Component Value Date   NA 138 11/23/2023   K 3.1 (L) 11/23/2023   CO2 31 11/23/2023   GLUCOSE 160 (H) 11/23/2023   BUN 12 11/23/2023   CREATININE 0.63 11/23/2023   BILITOT 0.5 11/23/2023   ALKPHOS 54 11/23/2023   AST 19 11/23/2023   ALT 28 11/23/2023   PROT 6.5 11/23/2023   ALBUMIN 4.3 11/23/2023   CALCIUM 9.4 11/23/2023   GFR 105.39 11/23/2023   Lab Results  Component Value Date   CHOL 257 (H) 11/23/2023   Lab Results  Component Value Date   HDL 56.90 11/23/2023   Lab Results  Component Value Date   LDLCALC 157 (H) 11/23/2023   Lab Results  Component Value Date   TRIG 215.0 (H) 11/23/2023   Lab Results  Component Value Date   CHOLHDL 5 11/23/2023   Lab Results  Component Value Date   HGBA1C 5.8 11/23/2023       Assessment & Plan:  Hyperglycemia Assessment & Plan: hgba1c acceptable, minimize simple carbs. Increase exercise as tolerated.   Orders: -     Ambulatory referral to Endocrinology -     Comprehensive metabolic panel; Future  Hyperlipidemia, mixed Assessment & Plan: Encourage heart healthy diet such as MIND or DASH diet, increase exercise, avoid trans fats, simple carbohydrates and processed foods, consider a krill or fish or flaxseed oil cap daily.     Leg cramp Assessment & Plan: Hydrate and monitor    Hyperinsulinemia -     Ambulatory referral to Endocrinology  PCOS (polycystic ovarian syndrome) -     Ambulatory referral to Endocrinology  Other orders -     Losartan Potassium; Take 1 tablet (50 mg total) by mouth daily.  Dispense: 30 tablet; Refill: 3    Assessment and Plan Assessment & Plan Hyperglycemia Worsening hyperglycemia due to Victoza discontinuation. Fasting glucose >126 mg/dL indicates diabetes progression.  Mounjaro preferred for dual mechanism and fewer side effects. - Order fasting glucose test with CMP. - Encourage high-carb meal before test. - Submit documentation to insurance for [employer] or Ozempic coverage. - Refer to endocrinologist.  Polycystic Ovarian Syndrome (PCOS) PCOS contributes to insulin resistance and hyperglycemia. Victoza discontinuation worsened symptoms. Mounjaro considered pending insurance approval. - Monitor glucose levels and adjust medications. - Consider starting [facility] pending insurance approval.  Secondary Hypertension Hypertension controlled with chlorthalidone but causing hypokalemia. Losartan recommended to manage blood pressure and renal protection. - Discontinue chlorthalidone, start losartan 50 mg daily. - Educate on dietary potassium sources, consider alternative supplements.  Cataracts Worsening vision likely due to hyperglycemia. Blood sugar control crucial to prevent progression. - Focus on controlling blood sugar levels.  Follow-up Ongoing monitoring and treatment adjustment required. - Schedule virtual follow-up in 6-8 weeks. - Plan 4-5 month follow-up visit and 8-10 month physical exam. - Coordinate  with new nurse practitioner.     Danise Edge, MD

## 2023-12-04 ENCOUNTER — Other Ambulatory Visit (INDEPENDENT_AMBULATORY_CARE_PROVIDER_SITE_OTHER)

## 2023-12-04 DIAGNOSIS — R739 Hyperglycemia, unspecified: Secondary | ICD-10-CM

## 2023-12-04 LAB — COMPREHENSIVE METABOLIC PANEL WITH GFR
ALT: 19 U/L (ref 0–35)
AST: 15 U/L (ref 0–37)
Albumin: 4.2 g/dL (ref 3.5–5.2)
Alkaline Phosphatase: 51 U/L (ref 39–117)
BUN: 13 mg/dL (ref 6–23)
CO2: 31 meq/L (ref 19–32)
Calcium: 8.9 mg/dL (ref 8.4–10.5)
Chloride: 100 meq/L (ref 96–112)
Creatinine, Ser: 0.64 mg/dL (ref 0.40–1.20)
GFR: 104.97 mL/min (ref >60.00)
Glucose, Bld: 146 mg/dL — ABNORMAL HIGH (ref 70–99)
Potassium: 3.2 meq/L — ABNORMAL LOW (ref 3.5–5.1)
Sodium: 140 meq/L (ref 135–145)
Total Bilirubin: 0.3 mg/dL (ref 0.2–1.2)
Total Protein: 6.5 g/dL (ref 6.0–8.3)

## 2023-12-04 MED ORDER — BLOOD GLUCOSE MONITORING SUPPL DEVI: 1.0000 | Freq: Three times a day (TID) | 0 refills | Status: AC

## 2023-12-04 MED ORDER — BLOOD GLUCOSE TEST VI STRP: 1.0000 | ORAL_STRIP | Freq: Three times a day (TID) | 0 refills | Status: AC

## 2023-12-04 MED ORDER — LANCET DEVICE MISC: 1.0000 | Freq: Three times a day (TID) | 0 refills | Status: AC

## 2023-12-04 MED ORDER — LANCETS MISC. MISC
1.0000 | Freq: Three times a day (TID) | 0 refills | Status: AC
Start: 2023-12-04 — End: 2024-01-03

## 2023-12-05 ENCOUNTER — Encounter: Payer: Self-pay | Admitting: Family Medicine

## 2023-12-06 ENCOUNTER — Encounter: Payer: Self-pay | Admitting: Family Medicine

## 2023-12-08 ENCOUNTER — Other Ambulatory Visit: Payer: Self-pay | Admitting: Family Medicine

## 2023-12-08 MED ORDER — TIRZEPATIDE 2.5 MG/0.5ML ~~LOC~~ SOAJ: 2.5000 mg | SUBCUTANEOUS | 1 refills | Status: DC

## 2023-12-09 ENCOUNTER — Encounter: Payer: Self-pay | Admitting: Family Medicine

## 2023-12-09 ENCOUNTER — Encounter (INDEPENDENT_AMBULATORY_CARE_PROVIDER_SITE_OTHER): Payer: Self-pay

## 2023-12-09 ENCOUNTER — Telehealth: Payer: Self-pay

## 2023-12-09 ENCOUNTER — Telehealth: Payer: Self-pay | Admitting: Family Medicine

## 2023-12-09 NOTE — Telephone Encounter (Signed)
 Copied from CRM [id number]. Topic: Referral - Prior Authorization Question >> Dec 09, 2023  2:16 PM Veronica Stokes wrote: Reason for CRM: Prior Authorization Regarding: Mounjaro 2.5MG /0.5ML   PT stated that as of today 04/02, the request for her medication has been denied. She says the Pharmacy has told her that they need notice of diabetes, and documentation of blood sugar higher than 120. PT says she had a blood sugar reading of 146 on 03/29 and another reading of 160 on 03/17 both of which were fasted. PT wants to confirm that this was documented as her insurance will only cover the medication with diabetes diagnoses/blood sugar over 120, otherwise she has to pay OOP which she can't pay.

## 2023-12-09 NOTE — Telephone Encounter (Signed)
 Pharmacy Patient Advocate Encounter   Received notification from CoverMyMeds that prior authorization for  Mounjaro 2.5MG /0.5ML auto-injectors is required/requested.   Insurance verification completed.   The patient is insured through [facility] .   Per test claim: PA required; PA submitted to above mentioned insurance via CoverMyMeds Key/confirmation #/EOC [id number] Status is pending

## 2023-12-10 ENCOUNTER — Telehealth: Payer: Self-pay

## 2023-12-10 NOTE — Telephone Encounter (Signed)
 Pharmacy Patient Advocate Encounter   Received notification from Pt Calls Messages that prior authorization for Mounjaro 2.5MG /0.5ML auto-injectors is required/requested.   Insurance verification completed.   The patient is insured through [facility] .   Per test claim: PA required; PA submitted to above mentioned insurance via CoverMyMeds Key/confirmation #/EOC  [id number] Status is pending

## 2023-12-10 NOTE — Telephone Encounter (Signed)
 Pharmacy Patient Advocate Encounter  Received notification from [facility] that Prior Authorization for  [facility] 2.5MG /0.5ML auto-injectors has been APPROVED from 12/10/2023 to 12/10/2026   PLEASE BE ADVISED APPROVAL LETTER HAS BEEN SCANNED IN MEDIA OF CHART   Melanee Spry CPhT Rx Patient Advocate [phone] (F) [phone]

## 2023-12-10 NOTE — Telephone Encounter (Signed)
 Can you please run a prior authorization for [facility]? Thank you!

## 2023-12-10 NOTE — Telephone Encounter (Signed)
 Pharmacy Patient Advocate Encounter  Received notification from [facility] that Prior Authorization for [facility] 2.5MG /0.5ML auto-injectors  has been DENIED.  Full denial letter will be uploaded to the media tab. See denial reason below.   PA #/Case ID/Reference #: [id number]

## 2023-12-10 NOTE — Telephone Encounter (Signed)
 PA request has been Submitted. New Encounter has been or will be created for follow up. For additional info see Pharmacy Prior Auth telephone encounter from 12/10/23.

## 2023-12-10 NOTE — Telephone Encounter (Signed)
   PA request has been  RESUBMITTED . New Encounter has been or will be created for follow up. For additional info see Pharmacy Prior Auth telephone encounter from 12/10/23.

## 2023-12-11 ENCOUNTER — Other Ambulatory Visit: Payer: Self-pay | Admitting: Family

## 2023-12-11 MED ORDER — LISINOPRIL 10 MG PO TABS
10.0000 mg | ORAL_TABLET | Freq: Every day | ORAL | 3 refills | Status: DC
Start: 2023-12-11 — End: 2024-01-05

## 2024-01-05 ENCOUNTER — Other Ambulatory Visit: Payer: Self-pay

## 2024-01-05 ENCOUNTER — Telehealth (INDEPENDENT_AMBULATORY_CARE_PROVIDER_SITE_OTHER): Admitting: Family Medicine

## 2024-01-05 ENCOUNTER — Encounter: Payer: Self-pay | Admitting: Family Medicine

## 2024-01-05 VITALS — BP 116/70

## 2024-01-05 DIAGNOSIS — E119 Type 2 diabetes mellitus without complications: Secondary | ICD-10-CM

## 2024-01-05 DIAGNOSIS — I159 Secondary hypertension, unspecified: Secondary | ICD-10-CM

## 2024-01-05 DIAGNOSIS — R739 Hyperglycemia, unspecified: Secondary | ICD-10-CM

## 2024-01-05 DIAGNOSIS — E782 Mixed hyperlipidemia: Secondary | ICD-10-CM

## 2024-01-05 DIAGNOSIS — Z7985 Long-term (current) use of injectable non-insulin antidiabetic drugs: Secondary | ICD-10-CM | POA: Diagnosis not present

## 2024-01-05 MED ORDER — TIRZEPATIDE 5 MG/0.5ML ~~LOC~~ SOAJ: 5.0000 mg | SUBCUTANEOUS | 1 refills | Status: DC

## 2024-01-05 MED ORDER — METOPROLOL SUCCINATE ER 25 MG PO TB24: 25.0000 mg | ORAL_TABLET | Freq: Every day | ORAL | 3 refills | Status: DC

## 2024-01-06 NOTE — Progress Notes (Signed)
 MyChart Video Visit    Virtual Visit via Video Note   This patient is at least at moderate risk for complications without adequate follow up. This format is felt to be most appropriate for this patient at this time. Physical exam was limited by quality of the video and audio technology used for the visit. [name], CMA was able to get the patient set up on a video visit.  Patient location: home Patient and provider in visit Provider location: Office  I discussed the limitations of evaluation and management by telemedicine and the availability of in person appointments. The patient expressed understanding and agreed to proceed.  Visit Date: 01/05/2024  Today's healthcare provider: Randie Bustle, MD     Subjective:    Patient ID: Veronica Stokes, female    DOB: September 24, 1975, 48 y.o.   MRN: [id number]  Chief Complaint  Patient presents with   Medical Management of Chronic Issues    Patient presents today for a month follow-up.    HPI Discussed the use of AI scribe software for clinical note transcription with the patient, who gave verbal consent to proceed.  History of Present Illness Veronica Stokes "Veronica Stokes" is a 48 year old female with hypertension who presents with concerns about blood pressure medication side effects.  She initially experienced significant side effects from losartan , including massive swelling in her face, hands, and legs, leading to a weight gain of 13 pounds over two days. Due to these side effects, she discontinued losartan .  After discontinuing losartan , she continued taking chlorthalidone , especially during road trips. However, upon switching to lisinopril , she developed pitting edema in her hands and feet, though it was less severe than with losartan . She initially thought the swelling was due to prolonged driving and stress from a colleague's death, but the symptoms persisted.  She has reverted to taking chlorthalidone  exclusively for the  past two days, which has normalized her condition. Her blood pressure was 116/70 mmHg this morning while on chlorthalidone . She has a history of taking atenolol in her late twenties to early thirties for heart palpitations and blackouts, which resolved after two years of treatment.  She takes potassium citrate gummies to manage her potassium levels while on chlorthalidone . These gummies are sugar-free and do not upset her stomach. Her husband, who is interested in holistic health, helped her find these gummies.  No current gastrointestinal issues or other significant symptoms.    Past Medical History:  Diagnosis Date   Allergic state 12/18/2008   Qualifier: Diagnosis of  By: Paulla Bossier MD, Dana Duncan     Asthma    cough variant   Chicken pox as a child   Hyperglycemia 10/28/2015   Hyperlipidemia, mixed 10/28/2015   Insomnia 09/09/2016   Obesity 09/09/2016   Polycystic ovarian syndrome 8-12   Preventative health care 10/28/2015    Past Surgical History:  Procedure Laterality Date   KNEE SURGERY  12-94, 10-07   X 2 left   WISDOM TOOTH EXTRACTION  2001    Family History  Problem Relation Age of Onset   Diabetes Mother        type 2   Hypertension Mother    Diabetes Father        type 2- off of everything since gastric bypass   Mental illness Sister        bipolar, ADHD   Mental illness Maternal Grandmother        manic and bipolar   Stroke Maternal Grandmother  Alcohol abuse Maternal Grandmother    Hearing loss Maternal Grandmother    Heart disease Maternal Grandfather    Heart attack Maternal Grandfather    Diabetes Paternal Grandmother        type 2   Stroke Paternal Grandmother    Thyroid  disease Paternal Grandmother    Stroke Paternal Grandfather    Heart attack Paternal Grandfather    Cancer Maternal Aunt 50       breast   Cancer Maternal Aunt 47       breast     Social History   Socioeconomic History   Marital status: Married    Spouse name: Not on file   Number  of children: Not on file   Years of education: Not on file   Highest education level: Not on file  Occupational History   Not on file  Tobacco Use   Smoking status: Never   Smokeless tobacco: Never  Vaping Use   Vaping status: Never Used  Substance and Sexual Activity   Alcohol use: No   Drug use: No   Sexual activity: Yes    Partners: Male    Comment: 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, teaches public safety and works with [employer]  Other Topics Concern   Not on file  Social History Narrative   Not on file   Social Drivers of Health   Financial Resource Strain: Not on file  Food Insecurity: Low Risk  (10/05/2023)   Received from [facility]   Hunger Vital Sign    Worried About Running Out of Food in the Last Year: Never true    Ran Out of Food in the Last Year: Never true  Transportation Needs: No Transportation Needs (10/05/2023)   Received from [facility]    In the past 12 months, has lack of reliable transportation kept you from medical appointments, meetings, work or from getting things needed for daily living? : No  Physical Activity: Not on file  Stress: Not on file  Social Connections: Unknown (01/21/2022)   Received from [facility], [facility]   Social Network    Social Network: Not on file  Intimate Partner Violence: Unknown (12/13/2021)   Received from [facility], [facility]   HITS    Physically Hurt: Not on file    Insult or Talk Down To: Not on file    Threaten Physical Harm: Not on file    Scream or Curse: Not on file    Outpatient Medications Prior to Visit  Medication Sig Dispense Refill   albuterol  (PROVENTIL ) (2.5 MG/3ML) 0.083% nebulizer solution Take 3 mLs (2.5 mg total) by nebulization every 4 (four) hours as needed for wheezing or shortness of breath. 75 mL 1   albuterol  (VENTOLIN  HFA) 108 (90 Base) MCG/ACT inhaler Inhale 2 puffs into the lungs every 6 (six) hours as needed for wheezing. 18 g 1   azelastine  (ASTELIN ) 0.1 %  nasal spray Place 2 sprays into both nostrils 2 (two) times daily. Use in each nostril as directed 30 mL 5   Blood Glucose Monitoring Suppl DEVI 1 each by Does not apply route in the morning, at noon, and at bedtime. May substitute to any manufacturer covered by patient's insurance. 1 each 0   calcium  carbonate (OS-CAL) 600 MG TABS Take 600 mg by mouth daily.     cetirizine (ZYRTEC) 10 MG tablet Take 10 mg by mouth daily.     Cyanocobalamin (B12 FAST DISSOLVE PO) Take by mouth.  Insulin  Pen Needle (PEN NEEDLES) 32G X 4 MM MISC Use with Saxenda  once daily 100 each 0   Lancets (FREESTYLE) lancets Use to check sugar once a day.  Dx Code: E16.2 100 each 1   mupirocin  ointment (BACTROBAN ) 2 % Apply 1 Application topically 2 (two) times daily. 22 g 1   omeprazole  (PRILOSEC) 40 MG capsule TAKE 1 CAPSULE(40 MG) BY MOUTH DAILY 90 capsule 1   Pediatric Multiple Vitamins (FLINTSTONES MULTIVITAMIN PO) Take 2 tablets by mouth.     lisinopril  (ZESTRIL ) 10 MG tablet Take 1 tablet (10 mg total) by mouth daily. 30 tablet 3   potassium chloride  SA (KLOR-CON  M) 20 MEQ tablet Take 1 tablet (20 mEq total) by mouth daily. Take 2 tablets daily for 3 days and decrease 1 tablet daily 35 tablet 2   tirzepatide  (MOUNJARO ) 2.5 MG/0.5ML Pen Inject 2.5 mg into the skin once a week. 2 mL 1   No facility-administered medications prior to visit.    Allergies  Allergen Reactions   Corticosteroids Anaphylaxis   Covid-19 (Mrna) Vaccine Swelling   Losartan  Swelling   Egg-Derived Products    Latex    Metformin  And Related Nausea And Vomiting   Penicillins     REACTION: SWELLING   Sulfa Antibiotics Itching   Lisinopril  Swelling    Review of Systems  Constitutional:  Negative for fever and malaise/fatigue.  HENT:  Negative for congestion.   Eyes:  Negative for blurred vision.  Respiratory:  Negative for shortness of breath.   Cardiovascular:  Positive for leg swelling. Negative for chest pain and palpitations.   Gastrointestinal:  Negative for abdominal pain, blood in stool and nausea.  Genitourinary:  Negative for dysuria and frequency.  Musculoskeletal:  Negative for falls.  Skin:  Negative for rash.  Neurological:  Negative for dizziness, loss of consciousness and headaches.  Endo/Heme/Allergies:  Negative for environmental allergies.  Psychiatric/Behavioral:  Negative for depression. The patient is not nervous/anxious.        Objective:    Physical Exam Constitutional:      General: She is not in acute distress.    Appearance: Normal appearance. She is not ill-appearing or toxic-appearing.  HENT:     Head: Normocephalic and atraumatic.     Right Ear: External ear normal.     Left Ear: External ear normal.     Nose: Nose normal.  Eyes:     General:        Right eye: No discharge.        Left eye: No discharge.  Pulmonary:     Effort: Pulmonary effort is normal.  Skin:    Findings: No rash.  Neurological:     Mental Status: She is alert and oriented to person, place, and time.  Psychiatric:        Behavior: Behavior normal.     BP 116/70 Comment: Pt reports Wt Readings from Last 3 Encounters:  06/02/23 167 lb 12.8 oz (76.1 kg)  01/23/23 152 lb (68.9 kg)  11/24/22 153 lb 9.6 oz (69.7 kg)       Assessment & Plan:  Hyperlipidemia, mixed Assessment & Plan: Encourage heart healthy diet such as MIND or DASH diet, increase exercise, avoid trans fats, simple carbohydrates and processed foods, consider a krill or fish or flaxseed oil cap daily.     New onset type 2 diabetes mellitus (HCC) Assessment & Plan: hgba1c acceptable, minimize simple carbs. Increase exercise as tolerated. Increase Mounjaro  to 5 mg weekly and continue to  monitor   Secondary hypertension Assessment & Plan: Well controlled, no changes to meds. Encouraged heart healthy diet such as the DASH diet and exercise as tolerated.     Other orders -     Metoprolol  Succinate ER; Take 1 tablet (25 mg total)  by mouth daily.  Dispense: 30 tablet; Refill: 3 -     Tirzepatide ; Inject 5 mg into the skin once a week.  Dispense: 6 mL; Refill: 1     Assessment and Plan Assessment & Plan Secondary Hypertension Significant edema with losartan  and lisinopril . Blood pressure controlled on chlorthalidone . Metoprolol  considered due to intolerance to previous medications and benefits for stress, palpitations, and pulse control. Discussed metoprolol 's benefits and lack of diuretic effects. - Initiate metoprolol  25 mg daily. - Schedule nurse visit in 2-4 weeks for blood pressure and pulse evaluation. - Order lab appointment for potassium level check. - Instruct to monitor blood pressure and pulse at home and record readings. - Remove lisinopril  and losartan  from active medications and add to allergy list. - Send metoprolol  prescription to [facility].     I discussed the assessment and treatment plan with the patient. The patient was provided an opportunity to ask questions and all were answered. The patient agreed with the plan and demonstrated an understanding of the instructions.   The patient was advised to call back or seek an in-person evaluation if the symptoms worsen or if the condition fails to improve as anticipated.  Randie Bustle, MD [facility] at [facility] [phone] (phone) [phone] (fax)  [facility]

## 2024-01-06 NOTE — Assessment & Plan Note (Signed)
 Encourage heart healthy diet such as MIND or DASH diet, increase exercise, avoid trans fats, simple carbohydrates and processed foods, consider a krill or fish or flaxseed oil cap daily.

## 2024-01-06 NOTE — Assessment & Plan Note (Signed)
 Well controlled, no changes to meds. Encouraged heart healthy diet such as the DASH diet and exercise as tolerated.

## 2024-01-06 NOTE — Assessment & Plan Note (Signed)
 hgba1c acceptable, minimize simple carbs. Increase exercise as tolerated. Increase Mounjaro  to 5 mg weekly and continue to monitor

## 2024-01-20 ENCOUNTER — Other Ambulatory Visit

## 2024-01-20 ENCOUNTER — Ambulatory Visit

## 2024-01-28 ENCOUNTER — Telehealth: Payer: Self-pay | Admitting: Neurology

## 2024-01-28 ENCOUNTER — Ambulatory Visit (INDEPENDENT_AMBULATORY_CARE_PROVIDER_SITE_OTHER): Admitting: Family Medicine

## 2024-01-28 VITALS — BP 122/80 | HR 76

## 2024-01-28 DIAGNOSIS — I159 Secondary hypertension, unspecified: Secondary | ICD-10-CM | POA: Diagnosis not present

## 2024-01-28 DIAGNOSIS — R739 Hyperglycemia, unspecified: Secondary | ICD-10-CM | POA: Diagnosis not present

## 2024-01-28 NOTE — Progress Notes (Signed)
 Patient comes in today for blood pressure check per Dr. Rodrick Clapper. Seen for virtual visit on 01/05/2024, from note:  Secondary Hypertension Significant edema with losartan  and lisinopril . Blood pressure controlled on chlorthalidone . Metoprolol  considered due to intolerance to previous medications and benefits for stress, palpitations, and pulse control. Discussed metoprolol 's benefits and lack of diuretic effects. - Initiate metoprolol  25 mg daily. - Schedule nurse visit in 2-4 weeks for blood pressure and pulse evaluation. - Order lab appointment for potassium level check. - Instruct to monitor blood pressure and pulse at home and record readings.   Veronica Stokes is taking Metoprolol  25 XR mg daily for blood pressure control. She denies any missed doses, side effects, headaches, chest pain, palpitations, dizziness, or shortness of breath.   Veronica Stokes has been checking blood pressure readings at home, but left her log at home. She reports top numbers in the 120's at most. She has not been keeping track of heart rate.   Her blood pressure reading today is: 122/80. HR 76  I spoke with Dr. Jalene Mayor (DOD) who advised okay to continue current medication.

## 2024-01-28 NOTE — Telephone Encounter (Signed)
 Patient came in for nurse visit and asked about increasing to next dosage of Mounjaro . Currently on 5 mg. No side effects. Please advise.

## 2024-01-29 LAB — COMPREHENSIVE METABOLIC PANEL WITH GFR
ALT: 15 U/L (ref 0–35)
AST: 13 U/L (ref 0–37)
Albumin: 4.2 g/dL (ref 3.5–5.2)
Alkaline Phosphatase: 46 U/L (ref 39–117)
BUN: 15 mg/dL (ref 6–23)
CO2: 26 meq/L (ref 19–32)
Calcium: 9.1 mg/dL (ref 8.4–10.5)
Chloride: 102 meq/L (ref 96–112)
Creatinine, Ser: 0.67 mg/dL (ref 0.40–1.20)
GFR: 103.71 mL/min (ref >60.00)
Glucose, Bld: 124 mg/dL — ABNORMAL HIGH (ref 70–99)
Potassium: 3.9 meq/L (ref 3.5–5.1)
Sodium: 138 meq/L (ref 135–145)
Total Bilirubin: 0.6 mg/dL (ref 0.2–1.2)
Total Protein: 6.4 g/dL (ref 6.0–8.3)

## 2024-01-30 ENCOUNTER — Other Ambulatory Visit: Payer: Self-pay | Admitting: Family

## 2024-01-30 MED ORDER — TIRZEPATIDE 7.5 MG/0.5ML ~~LOC~~ SOAJ: 7.5000 mg | SUBCUTANEOUS | 1 refills | Status: DC

## 2024-02-01 ENCOUNTER — Ambulatory Visit: Payer: Self-pay | Admitting: Family Medicine

## 2024-03-30 ENCOUNTER — Encounter: Payer: Self-pay | Admitting: Family Medicine

## 2024-03-31 ENCOUNTER — Other Ambulatory Visit: Payer: Self-pay | Admitting: Family

## 2024-03-31 MED ORDER — TIRZEPATIDE 10 MG/0.5ML ~~LOC~~ SOAJ
10.0000 mg | SUBCUTANEOUS | 1 refills | Status: DC
Start: 2024-03-31 — End: 2024-06-08

## 2024-04-26 ENCOUNTER — Other Ambulatory Visit: Payer: Self-pay | Admitting: Family Medicine

## 2024-05-17 NOTE — Progress Notes (Unsigned)
 Name: Veronica Stokes  MRN/ DOB: [id number], August 13, 1976    Age/ Sex: 48 y.o., female    PCP: Domenica Harlene LABOR, MD   Reason for Endocrinology Evaluation: PCOS     Date of Initial Endocrinology Evaluation: 05/17/2024     HPI: Ms. Veronica Stokes is a 48 y.o. female with a past medical history of PCOS, Hx gastric sleeve. The patient presented for initial endocrinology clinic visit on 05/17/2024 for consultative assistance with her PCOS.   Patient was diagnosed with PCOS in 2009. 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 She had partial hysterectomy in 2023   Of note, patient has a history of gastric sleeve surgery  Patient was seen through [facility], was on phentermine for obesity   Mother and aunt have BRCA mutation  HISTORY:  Past Medical History:  Past Medical History:  Diagnosis Date   Allergic state 12/18/2008   Qualifier: Diagnosis of  By: Mahlon MD, [place]     Asthma    cough variant   Chicken pox as a child   Hyperglycemia 10/28/2015   Hyperlipidemia, mixed 10/28/2015   Insomnia 09/09/2016   Obesity 09/09/2016   Polycystic ovarian syndrome 8-12   Preventative health care 10/28/2015   Past Surgical History:  Past Surgical History:  Procedure Laterality Date   KNEE SURGERY  12-94, 10-07   X 2 left   WISDOM TOOTH EXTRACTION  2001    Social History:  reports that she has never smoked. She has never used smokeless tobacco. She reports that she does not drink alcohol and does not use drugs. Family History: family history includes Alcohol abuse in her maternal grandmother; Cancer (age of onset: 60) in her maternal aunt and maternal aunt; Diabetes in her father, mother, and paternal grandmother; Hearing loss in her maternal grandmother; Heart attack in her maternal grandfather and paternal grandfather; Heart disease in her maternal grandfather; Hypertension in her mother; Mental illness in her maternal grandmother and sister;  Stroke in her maternal grandmother, paternal grandfather, and paternal grandmother; Thyroid  disease in her paternal grandmother.   HOME MEDICATIONS: Allergies as of 05/18/2024       Reactions   Corticosteroids Anaphylaxis   Covid-19 (mrna) Vaccine Swelling   Losartan  Swelling   Egg-derived Products    Latex    Metformin  And Related Nausea And Vomiting   Penicillins    REACTION: SWELLING   Sulfa Antibiotics Itching   Lisinopril  Swelling        Medication List        Accurate as of May 17, 2024  2:56 PM. If you have any questions, ask your nurse or doctor.          albuterol  (2.5 MG/3ML) 0.083% nebulizer solution Commonly known as: PROVENTIL  Take 3 mLs (2.5 mg total) by nebulization every 4 (four) hours as needed for wheezing or shortness of breath.   albuterol  108 (90 Base) MCG/ACT inhaler Commonly known as: VENTOLIN  HFA Inhale 2 puffs into the lungs every 6 (six) hours as needed for wheezing.   azelastine  0.1 % nasal spray Commonly known as: ASTELIN  Place 2 sprays into both nostrils 2 (two) times daily. Use in each nostril as directed   B12 FAST DISSOLVE PO Take by mouth.   Blood Glucose Monitoring Suppl Devi 1 each by Does not apply route in the morning, at noon, and at bedtime. May substitute to any manufacturer covered by patient's insurance.   calcium  carbonate 600 MG Tabs tablet Commonly known as: OS-CAL  Take 600 mg by mouth daily.   cetirizine 10 MG tablet Commonly known as: ZYRTEC Take 10 mg by mouth daily.   FLINTSTONES MULTIVITAMIN PO Take 2 tablets by mouth.   freestyle lancets Use to check sugar once a day.  Dx Code: E16.2   metoprolol  succinate 25 MG 24 hr tablet Commonly known as: TOPROL -XL Take 1 tablet (25 mg total) by mouth daily.   mupirocin  ointment 2 % Commonly known as: BACTROBAN  Apply 1 Application topically 2 (two) times daily.   omeprazole  40 MG capsule Commonly known as: PRILOSEC TAKE 1 CAPSULE(40 MG) BY MOUTH  DAILY   Pen Needles 32G X 4 MM Misc Use with Saxenda  once daily   tirzepatide  10 MG/0.5ML Pen Commonly known as: MOUNJARO  Inject 10 mg into the skin once a week.          REVIEW OF SYSTEMS: A comprehensive ROS was conducted with the patient and is negative except as per HPI and below:  ROS     OBJECTIVE:  VS: There were no vitals taken for this visit.   Wt Readings from Last 3 Encounters:  06/02/23 167 lb 12.8 oz (76.1 kg)  01/23/23 152 lb (68.9 kg)  11/24/22 153 lb 9.6 oz (69.7 kg)     EXAM: General: Pt appears well and is in NAD  Eyes: External eye exam normal without stare, lid lag or exophthalmos.  EOM intact.  PERRL.  Neck: General: Supple without adenopathy. Thyroid : Thyroid  size normal.  No goiter or nodules appreciated. No thyroid  bruit.  Lungs: Clear with good BS bilat   Heart: Auscultation: RRR.  Abdomen: Soft, nontender  Extremities:  BL LE: No pretibial edema   Mental Status: Judgment, insight: Intact Orientation: Oriented to time, place, and person Mood and affect: No depression, anxiety, or agitation     DATA REVIEWED: ***    ASSESSMENT/PLAN/RECOMMENDATIONS:   ***    Medications :  Signed electronically by: Stefano Redgie Butts, MD  [facility] Endocrinology  [facility] [address] [place], [place] [address] Phone: [phone] FAX: [phone]   CC: Domenica Harlene LABOR, MD [address] [place] [address] Phone: [phone] Fax: [phone]   Return to Endocrinology clinic as below: Future Appointments  Date Time Provider Department Center  05/18/2024  8:50 AM Reg Bircher, Donell Redgie, MD [facility] None  06/09/2024  3:20 PM Domenica Harlene LABOR, MD [facility] [address]  09/29/2024  3:20 PM Domenica Harlene LABOR, MD [facility] [address]

## 2024-05-18 ENCOUNTER — Ambulatory Visit: Admitting: Internal Medicine

## 2024-05-18 ENCOUNTER — Encounter: Payer: Self-pay | Admitting: Internal Medicine

## 2024-05-18 VITALS — BP 124/80 | HR 77 | Ht 59.0 in | Wt 159.0 lb

## 2024-05-18 DIAGNOSIS — E119 Type 2 diabetes mellitus without complications: Secondary | ICD-10-CM

## 2024-05-18 DIAGNOSIS — E282 Polycystic ovarian syndrome: Secondary | ICD-10-CM

## 2024-05-18 LAB — POCT GLYCOSYLATED HEMOGLOBIN (HGB A1C): Hemoglobin A1C: 5.2 % (ref 4.0–5.6)

## 2024-05-18 LAB — POCT GLUCOSE (DEVICE FOR HOME USE): Glucose Fasting, POC: 93 mg/dL (ref 70–99)

## 2024-05-18 NOTE — Patient Instructions (Signed)
 24-Hour Urine Collection   You will be collecting your urine for a 24-hour period of time.  Your timer starts with your first urine of the morning (For example - If you first pee at 9AM, your timer will start at 9AM)  Throw away your first urine of the morning  Collect your urine every time you pee for the next 24 hours STOP your urine collection 24 hours after you started the collection (For example - You would stop at 9AM the day after you started)

## 2024-05-19 ENCOUNTER — Ambulatory Visit: Payer: Self-pay | Admitting: Internal Medicine

## 2024-05-19 DIAGNOSIS — E119 Type 2 diabetes mellitus without complications: Secondary | ICD-10-CM | POA: Insufficient documentation

## 2024-05-25 LAB — BASIC METABOLIC PANEL WITH GFR
BUN: 13 mg/dL (ref 7–25)
CO2: 27 mmol/L (ref 20–32)
Calcium: 9.6 mg/dL (ref 8.6–10.2)
Chloride: 105 mmol/L (ref 98–110)
Creat: 0.84 mg/dL (ref 0.50–0.99)
Glucose, Bld: 85 mg/dL (ref 65–99)
Potassium: 3.9 mmol/L (ref 3.5–5.3)
Sodium: 140 mmol/L (ref 135–146)
eGFR: 86 mL/min/1.73m2 (ref >=60)

## 2024-05-25 LAB — FOLLICLE STIMULATING HORMONE: FSH: 6.6 m[IU]/mL

## 2024-05-25 LAB — CORTISOL: Cortisol, Plasma: 11.1 ug/dL

## 2024-05-25 LAB — DHEA-SULFATE: DHEA-SO4: 134 ug/dL (ref 15–205)

## 2024-05-25 LAB — 17-HYDROXYPROGESTERONE: 17-OH-Progesterone, LC/MS/MS: 138 ng/dL

## 2024-05-25 LAB — TESTOSTERONE, TOTAL, LC/MS/MS: Testosterone, Total, LC-MS-MS: 20 ng/dL (ref 2–45)

## 2024-05-25 LAB — ACTH: C206 ACTH: 11 pg/mL (ref 6–50)

## 2024-06-01 ENCOUNTER — Other Ambulatory Visit: Payer: Self-pay | Admitting: Family Medicine

## 2024-06-03 ENCOUNTER — Other Ambulatory Visit: Payer: Self-pay | Admitting: Family

## 2024-06-05 NOTE — Assessment & Plan Note (Signed)
 Encourage heart healthy diet such as MIND or DASH diet, increase exercise, avoid trans fats, simple carbohydrates and processed foods, consider a krill or fish or flaxseed oil cap daily.

## 2024-06-05 NOTE — Assessment & Plan Note (Signed)
 Encouraged DASH or MIND diet, decrease po intake and increase exercise as tolerated. Needs 7-8 hours of sleep nightly. Avoid trans fats, eat small, frequent meals every 4-5 hours with lean proteins, complex carbs and healthy fats. Minimize simple carbs, high fat foods and processed foods

## 2024-06-05 NOTE — Assessment & Plan Note (Signed)
Doing well without meds

## 2024-06-05 NOTE — Assessment & Plan Note (Signed)
Her insurance has stopped covering her GLP1 and her sugars are trending up. Will continue to monitor and she will maintain a heart healthy diet and stay active.

## 2024-06-05 NOTE — Assessment & Plan Note (Signed)
 hgba1c acceptable, minimize simple carbs. Increase exercise as tolerated. Continue current meds

## 2024-06-05 NOTE — Assessment & Plan Note (Signed)
 Well controlled, no changes to meds. Encouraged heart healthy diet such as the DASH diet and exercise as tolerated.

## 2024-06-08 NOTE — Telephone Encounter (Signed)
 Copied from CRM #[id number]. Topic: Clinical - Medication Question >> Jun 08, 2024  1:26 PM Alfonso HERO wrote: Reason for CRM: [facility] calling for refill status of tirzepatide  (MOUNJARO ) 10 MG/0.5ML Pen [phone]

## 2024-06-09 ENCOUNTER — Telehealth: Admitting: Family Medicine

## 2024-06-09 DIAGNOSIS — N63 Unspecified lump in unspecified breast: Secondary | ICD-10-CM

## 2024-06-09 DIAGNOSIS — E119 Type 2 diabetes mellitus without complications: Secondary | ICD-10-CM | POA: Diagnosis not present

## 2024-06-09 DIAGNOSIS — F909 Attention-deficit hyperactivity disorder, unspecified type: Secondary | ICD-10-CM

## 2024-06-09 DIAGNOSIS — E6609 Other obesity due to excess calories: Secondary | ICD-10-CM | POA: Diagnosis not present

## 2024-06-09 DIAGNOSIS — I159 Secondary hypertension, unspecified: Secondary | ICD-10-CM

## 2024-06-09 DIAGNOSIS — E88819 Insulin resistance, unspecified: Secondary | ICD-10-CM

## 2024-06-09 DIAGNOSIS — E782 Mixed hyperlipidemia: Secondary | ICD-10-CM | POA: Diagnosis not present

## 2024-06-09 MED ORDER — METOPROLOL SUCCINATE ER 25 MG PO TB24: 25.0000 mg | ORAL_TABLET | Freq: Every day | ORAL | 1 refills | Status: DC

## 2024-06-12 ENCOUNTER — Encounter: Payer: Self-pay | Admitting: Family Medicine

## 2024-06-12 NOTE — Assessment & Plan Note (Signed)
 Left breast, patient just noticed it today. Mildly tender at roughly 6 oclock diagnostic MGM  and Ultrasound

## 2024-06-12 NOTE — Progress Notes (Signed)
 MyChart Video Visit    Virtual Visit via Video Note   This patient is at least at moderate risk for complications without adequate follow up. This format is felt to be most appropriate for this patient at this time. Physical exam was limited by quality of the video and audio technology used for the visit. Veronica Stokes was able to get the patient set up on a video visit.  Patient location: home Patient and provider in visit Provider location: Office  I discussed the limitations of evaluation and management by telemedicine and the availability of in person appointments. The patient expressed understanding and agreed to proceed.  Visit Date: 06/09/2024  Today's healthcare provider: Harlene Horton, MD     Subjective:    Patient ID: Veronica Stokes, female    DOB: 12/04/1975, 48 y.o.   MRN: [id number]  Chief Complaint  Patient presents with   Medical Management of Chronic Issues    Patient presents today for a 6 month f/u   Quality Metric Gaps    Foot & eye exam, colonoscopy, TDAP, Hep B vaccines, urine mircoalbumin    HPI Discussed the use of AI scribe software for clinical note transcription with the patient, who gave verbal consent to proceed.  History of Present Illness Veronica Stokes Veronica Stokes is a 48 year old female who presents with a newly discovered lump in her left breast.  She discovered a lumpy knot on the bottom side of her left breast this morning. The lump is sore but not painful, oblong, and approximately the size of a dime. It is located between the 6:30 and 7 o'clock position on the breast, just above the bone area. Her mammogram is not due until January.  She has a history of a previous breast issue in 2017-2018, for which she was seen at [facility]. Due to a backlog at her usual center, she is considering options for a diagnostic appointment elsewhere.  She is currently taking metoprolol , which was refilled recently, and Mounjaro . Her vision has  stabilized, and she is not experiencing the 'bouncy vision' she had previously.  She has a history of PCOS and has been managing insulin  resistance. She recently saw an endocrinologist who suggested a different approach to monitoring her condition, including a glucose sensitivity test instead of relying solely on A1c levels.  She is also managing her cholesterol levels and has previously experienced leg pain with statin use. She is open to trying atorvastatin if her cholesterol remains elevated.  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 She is dealing with changes in her work environment and has a Tax adviser.    Past Medical History:  Diagnosis Date   Allergic state 12/18/2008   Qualifier: Diagnosis of  By: Mahlon MD, [place]     Asthma    cough variant   Chicken pox as a child   Hyperglycemia 10/28/2015   Hyperlipidemia, mixed 10/28/2015   Insomnia 09/09/2016   Obesity 09/09/2016   Polycystic ovarian syndrome 8-12   Preventative health care 10/28/2015    Past Surgical History:  Procedure Laterality Date   KNEE SURGERY  12-94, 10-07   X 2 left   WISDOM TOOTH EXTRACTION  2001    Family History  Problem Relation Age of Onset   Diabetes Mother        type 2   Hypertension Mother    Diabetes Father        type 2- off of everything since gastric bypass  Mental illness Sister        bipolar, ADHD   Mental illness Maternal Grandmother        manic and bipolar   Stroke Maternal Grandmother    Alcohol abuse Maternal Grandmother    Hearing loss Maternal Grandmother    Heart disease Maternal Grandfather    Heart attack Maternal Grandfather    Diabetes Paternal Grandmother        type 2   Stroke Paternal Grandmother    Thyroid  disease Paternal Grandmother    Stroke Paternal Grandfather    Heart attack Paternal Grandfather    Cancer Maternal Aunt 50       breast   Cancer Maternal Aunt 70       breast     Social History   Socioeconomic History   Marital status:  Married    Spouse name: Not on file   Number of children: Not on file   Years of education: Not on file   Highest education level: Not on file  Occupational History   Not on file  Tobacco Use   Smoking status: Never   Smokeless tobacco: Never  Vaping Use   Vaping status: Never Used  Substance and Sexual Activity   Alcohol use: No   Drug use: No   Sexual activity: Yes    Partners: Male    Comment: 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, teaches public safety and works with [employer]  Other Topics Concern   Not on file  Social History Narrative   Not on file   Social Drivers of Health   Financial Resource Strain: Not on file  Food Insecurity: Low Risk  (10/05/2023)   Received from [facility]   Hunger Vital Sign    Within the past 12 months, you worried that your food would run out before you got money to buy more: Never true    Within the past 12 months, the food you bought just didn't last and you didn't have money to get more. : Never true  Transportation Needs: No Transportation Needs (10/05/2023)   Received from [facility]    In the past 12 months, has lack of reliable transportation kept you from medical appointments, meetings, work or from getting things needed for daily living? : No  Physical Activity: Not on file  Stress: Not on file  Social Connections: Unknown (01/21/2022)   Received from [facility]   Social Network    Social Network: Not on file  Intimate Partner Violence: Unknown (12/13/2021)   Received from [facility]   HITS    Physically Hurt: Not on file    Insult or Talk Down To: Not on file    Threaten Physical Harm: Not on file    Scream or Curse: Not on file    Outpatient Medications Prior to Visit  Medication Sig Dispense Refill   albuterol  (PROVENTIL ) (2.5 MG/3ML) 0.083% nebulizer solution Take 3 mLs (2.5 mg total) by nebulization every 4 (four) hours as needed for wheezing or shortness of breath. 75 mL 1   albuterol  (VENTOLIN  HFA) 108 (90  Base) MCG/ACT inhaler Inhale 2 puffs into the lungs every 6 (six) hours as needed for wheezing. 18 g 1   azelastine  (ASTELIN ) 0.1 % nasal spray Place 2 sprays into both nostrils 2 (two) times daily. Use in each nostril as directed 30 mL 5   Blood Glucose Monitoring Suppl DEVI 1 each by Does not apply route in the morning, at noon, and  at bedtime. May substitute to any manufacturer covered by patient's insurance. 1 each 0   calcium  carbonate (OS-CAL) 600 MG TABS Take 600 mg by mouth daily.     cetirizine (ZYRTEC) 10 MG tablet Take 10 mg by mouth daily.     Cyanocobalamin (B12 FAST DISSOLVE PO) Take by mouth.     Insulin  Pen Needle (PEN NEEDLES) 32G X 4 MM MISC Use with Saxenda  once daily 100 each 0   Lancets (FREESTYLE) lancets Use to check sugar once a day.  Dx Code: E16.2 100 each 1   mupirocin  ointment (BACTROBAN ) 2 % Apply 1 Application topically 2 (two) times daily. 22 g 1   omeprazole  (PRILOSEC) 40 MG capsule TAKE 1 CAPSULE(40 MG) BY MOUTH DAILY 90 capsule 1   Pediatric Multiple Vitamins (FLINTSTONES MULTIVITAMIN PO) Take 2 tablets by mouth.     tirzepatide  (MOUNJARO ) 10 MG/0.5ML Pen Inject 10 mg into the skin once a week. 2 mL 1   metoprolol  succinate (TOPROL -XL) 25 MG 24 hr tablet Take 1 tablet (25 mg total) by mouth daily. Take with or immediately following a meal 90 tablet 0   No facility-administered medications prior to visit.    Allergies  Allergen Reactions   Corticosteroids Anaphylaxis   Covid-19 (Mrna) Vaccine Swelling   Losartan  Swelling   Egg-Derived Products    Latex    Metformin  And Related Nausea And Vomiting   Penicillins     REACTION: SWELLING   Sulfa Antibiotics Itching   Lisinopril  Swelling    Review of Systems  Constitutional:  Negative for fever and malaise/fatigue.  HENT:  Negative for congestion.   Eyes:  Negative for blurred vision.  Respiratory:  Negative for shortness of breath.   Cardiovascular:  Negative for chest pain, palpitations and leg  swelling.  Gastrointestinal:  Negative for abdominal pain, blood in stool and nausea.  Genitourinary:  Negative for dysuria and frequency.  Musculoskeletal:  Negative for falls.  Skin:  Negative for rash.  Neurological:  Negative for dizziness, loss of consciousness and headaches.  Endo/Heme/Allergies:  Negative for environmental allergies.  Psychiatric/Behavioral:  Negative for depression. The patient is not nervous/anxious.        Objective:    Physical Exam Constitutional:      General: She is not in acute distress.    Appearance: Normal appearance. She is not ill-appearing or toxic-appearing.  HENT:     Head: Normocephalic and atraumatic.     Right Ear: External ear normal.     Left Ear: External ear normal.     Nose: Nose normal.  Eyes:     General:        Right eye: No discharge.        Left eye: No discharge.  Pulmonary:     Effort: Pulmonary effort is normal.  Skin:    Findings: No rash.  Neurological:     Mental Status: She is alert and oriented to person, place, and time.  Psychiatric:        Behavior: Behavior normal.     There were no vitals taken for this visit. Wt Readings from Last 3 Encounters:  05/18/24 159 lb (72.1 kg)  06/02/23 167 lb 12.8 oz (76.1 kg)  01/23/23 152 lb (68.9 kg)       Assessment & Plan:  Attention deficit hyperactivity disorder (ADHD), unspecified ADHD type Assessment & Plan: Doing well without meds   Diabetes mellitus without complication (HCC) Assessment & Plan: hgba1c acceptable, minimize simple carbs. Increase exercise as  tolerated. Continue current meds   Orders: -     Microalbumin / creatinine urine ratio; Future  Hyperlipidemia, mixed Assessment & Plan: Encourage heart healthy diet such as MIND or DASH diet, increase exercise, avoid trans fats, simple carbohydrates and processed foods, consider a krill or fish or flaxseed oil cap daily.    Orders: -     TSH; Future -     Lipid panel; Future  Obesity due to  excess calories without serious comorbidity, unspecified class Assessment & Plan: Encouraged DASH or MIND diet, decrease po intake and increase exercise as tolerated. Needs 7-8 hours of sleep nightly. Avoid trans fats, eat small, frequent meals every 4-5 hours with lean proteins, complex carbs and healthy fats. Minimize simple carbs, high fat foods and processed foods    Secondary hypertension Assessment & Plan: Well controlled, no changes to meds. Encouraged heart healthy diet such as the DASH diet and exercise as tolerated.    Orders: -     Comprehensive metabolic panel with GFR; Future -     CBC with Differential/Platelet; Future  Insulin  resistance Assessment & Plan: Her insurance has stopped covering her GLP1 and her sugars are trending up. Will continue to monitor and she will maintain a heart healthy diet and stay active.  Orders: -     Microalbumin / creatinine urine ratio; Future  Breast mass in female Assessment & Plan: Left breast, patient just noticed it today. Mildly tender at roughly 6 oclock diagnostic MGM  and Ultrasound   Orders: -     MM 3D DIAGNOSTIC MAMMOGRAM UNILATERAL LEFT BREAST; Future -     US  LIMITED ULTRASOUND INCLUDING AXILLA LEFT BREAST ; Future  Other orders -     Metoprolol  Succinate ER; Take 1 tablet (25 mg total) by mouth daily. Take with or immediately following a meal  Dispense: 90 tablet; Refill: 1     Assessment and Plan Assessment & Plan Left breast mass A new left breast mass, described as a lumpy knot, sore, and located between 6:30 and 7:00, approximately the size of a dime but oblong, was discovered during a self-exam. Family history of breast issues increases concern for malignancy. Acute presentation noted today. - Order diagnostic ultrasound and mammogram of the left breast stat. - Attempt urgent referral to [facility] for expedited diagnostics. - Consider referral to [facility] or [facility] if [facility] cannot accommodate  promptly.  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 Symptoms and weight are well-controlled, with no recent issues of blurry vision or fluctuating glucose levels. Endocrinologist is satisfied with progress and plans reassessment in six months. - Continue tirzepatide  (Mounjaro ) at current dose. - Consider increasing tirzepatide  dose after current supply; she will send a MyChart message in two weeks if ready to increase. - Schedule follow-up with endocrinologist in six months.  Mixed hyperlipidemia Mixed hyperlipidemia is being monitored. Intolerance to rosuvastatin  due to leg pain noted. Atorvastatin may be considered if cholesterol levels remain elevated. Referral to a lipid clinic for injectable medications may be considered if atorvastatin is not tolerated. - Order blood work to assess cholesterol levels. - Consider starting atorvastatin if cholesterol levels are elevated. - If atorvastatin is not tolerated, refer to lipid clinic for evaluation of injectable medications.  Hypertension Hypertension is managed with metoprolol . - Ensure metoprolol  prescription is available at the pharmacy.  Recording duration: 16 minutes     I discussed the  assessment and treatment plan with the patient. The patient was provided an opportunity to ask questions and all were answered. The patient agreed with the plan and demonstrated an understanding of the instructions.   The patient was advised to call back or seek an in-person evaluation if the symptoms worsen or if the condition fails to improve as anticipated.  Harlene Horton, MD [facility] [phone] (phone) [phone] (fax)  [facility]

## 2024-06-16 ENCOUNTER — Encounter

## 2024-06-16 ENCOUNTER — Other Ambulatory Visit

## 2024-06-24 ENCOUNTER — Ambulatory Visit
Admission: RE | Admit: 2024-06-24 | Discharge: 2024-06-24 | Disposition: A | Source: Ambulatory Visit | Attending: Family Medicine | Admitting: Family Medicine

## 2024-06-24 ENCOUNTER — Ambulatory Visit
Admission: RE | Admit: 2024-06-24 | Discharge: 2024-06-24 | Disposition: A | Discharge status: Home / Self Care | Source: Ambulatory Visit | Attending: Family Medicine | Admitting: Family Medicine

## 2024-06-24 DIAGNOSIS — N63 Unspecified lump in unspecified breast: Secondary | ICD-10-CM

## 2024-07-04 ENCOUNTER — Encounter: Payer: Self-pay | Admitting: Family Medicine

## 2024-07-04 ENCOUNTER — Other Ambulatory Visit: Payer: Self-pay | Admitting: Family

## 2024-07-04 MED ORDER — TIRZEPATIDE 12.5 MG/0.5ML ~~LOC~~ SOAJ: 12.5000 mg | SUBCUTANEOUS | 1 refills | Status: DC

## 2024-09-25 NOTE — Assessment & Plan Note (Signed)
 Encourage heart healthy diet such as MIND or DASH diet, increase exercise, avoid trans fats, simple carbohydrates and processed foods, consider a krill or fish or flaxseed oil cap daily.

## 2024-09-25 NOTE — Progress Notes (Signed)
 "  Subjective:    Patient ID: Veronica Stokes, female    DOB: May 21, 1976, 49 y.o.   MRN: [id number]  Chief Complaint  Patient presents with   Annual Exam    Patient presents today for a physical exam   Quality Metric Gaps    Colonoscopy, foot & eye exam, Hep B vaccines, TDAP vaccine, pneumococcal vaccine    HPI Discussed the use of AI scribe software for clinical note transcription with the patient, who gave verbal consent to proceed.  History of Present Illness Veronica Stokes is a 49 year old female who presents for medication refills and follow-up.  She is currently taking Mounjaro  12 mg for diabetes management, metoprolol  for hypertension, and omeprazole  for GERD. She requires refills for metoprolol  and omeprazole . She reports no significant side effects from Mounjaro  and is comfortable with the current dose.  She has a history of diabetes, with fasting blood sugars previously recorded over 126 mg/dL on two occasions. Her blood sugar control has improved, which she notes has positively impacted her vision, particularly with 'snowflake cataracts'.  She experiences gastrointestinal distress, particularly diarrhea, within 30 minutes of consuming certain foods like potatoes. She is mindful of her protein intake and physical activity to maintain muscle mass.  Her son, aged 34, was recently diagnosed with epilepsy, which has affected his ability to drive. Her husband is dealing with prostate issues, with a PSA level of 5.8 and a PRAD score of 4, raising concerns about potential cancer. These family health issues have been a significant source of stress.  She has a history of hyperglycemia, with previous fasting blood sugars recorded at 160 mg/dL and 853 mg/dL, contributing to her diabetes diagnosis. Initially, her endocrinologist did not diagnose her with diabetes due to her polycystic ovary syndrome, but her blood sugar levels warranted treatment.    Past Medical  History:  Diagnosis Date   Allergic state 12/18/2008   Qualifier: Diagnosis of  By: Mahlon MD, [place]     Allergy    Asthma    cough variant   Chicken pox as a child   GERD (gastroesophageal reflux disease)    Hyperglycemia 10/28/2015   Hyperlipidemia, mixed 10/28/2015   Hypertension    Insomnia 09/09/2016   Obesity 09/09/2016   Polycystic ovarian syndrome 04/2011   Preventative health care 10/28/2015    Past Surgical History:  Procedure Laterality Date   ABDOMINAL HYSTERECTOMY  Jan 2023   KNEE SURGERY  12-94, 10-07   X 2 left   WISDOM TOOTH EXTRACTION  09/09/1999    Family History  Problem Relation Age of Onset   Diabetes Mother        type 2   Hypertension Mother    Diabetes Father        type 2- off of everything since gastric bypass   Mental illness Sister        bipolar, ADHD   Mental illness Maternal Grandmother        manic and bipolar   Stroke Maternal Grandmother    Alcohol abuse Maternal Grandmother    Hearing loss Maternal Grandmother    Heart disease Maternal Grandfather    Heart attack Maternal Grandfather    Diabetes Paternal Grandmother        type 2   Stroke Paternal Grandmother    Thyroid  disease Paternal Grandmother    Stroke Paternal Grandfather    Heart attack Paternal Grandfather    Cancer Maternal Aunt 50  breast   Cancer Maternal Aunt 10       breast     Social History   Socioeconomic History   Marital status: Married    Spouse name: Not on file   Number of children: Not on file   Years of education: Not on file   Highest education level: Not on file  Occupational History   Not on file  Tobacco Use   Smoking status: Never   Smokeless tobacco: Never  Vaping Use   Vaping status: Never Used  Substance and Sexual Activity   Alcohol use: No   Drug use: No   Sexual activity: Yes    Partners: Male    Comment: 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, teaches public safety and works with [employer]  Other Topics Concern   Not on file   Social History Narrative   Not on file   Social Drivers of Health   Tobacco Use: Low Risk (09/29/2024)   Patient History    Smoking Tobacco Use: Never    Smokeless Tobacco Use: Never    Passive Exposure: Not on file  Financial Resource Strain: Not on file  Food Insecurity: Low Risk (10/05/2023)   Received from [facility]   Epic    Within the past 12 months, you worried that your food would run out before you got money to buy more: Never true    Within the past 12 months, the food you bought just didn't last and you didn't have money to get more. : Never true  Transportation Needs: No Transportation Needs (10/05/2023)   Received from [facility]    In the past 12 months, has lack of reliable transportation kept you from medical appointments, meetings, work or from getting things needed for daily living? : No  Physical Activity: Not on file  Stress: Not on file  Social Connections: Unknown (01/21/2022)   Received from [facility]   Social Network    Social Network: Not on file  Intimate Partner Violence: Unknown (12/13/2021)   Received from [facility]   HITS    Physically Hurt: Not on file    Insult or Talk Down To: Not on file    Threaten Physical Harm: Not on file    Scream or Curse: Not on file  Depression (PHQ2-9): Low Risk (09/29/2024)   Depression (PHQ2-9)    PHQ-2 Score: 0  Alcohol Screen: Not on file  Housing: Low Risk (10/05/2023)   Received from [facility]   Epic    What is your living situation today?: I have a steady place to live    Think about the place you live. Do you have problems with any of the following? Choose all that apply:: Not on file  Utilities: Low Risk (10/05/2023)   Received from [facility]   Utilities    In the past 12 months has the electric, gas, oil, or water company threatened to shut off services in your home? : No  Health Literacy: Not on file    Outpatient Medications Prior to Visit  Medication Sig Dispense  Refill   albuterol  (PROVENTIL ) (2.5 MG/3ML) 0.083% nebulizer solution Take 3 mLs (2.5 mg total) by nebulization every 4 (four) hours as needed for wheezing or shortness of breath. 75 mL 1   albuterol  (VENTOLIN  HFA) 108 (90 Base) MCG/ACT inhaler Inhale 2 puffs into the lungs every 6 (six) hours as needed for wheezing. 18 g 1   azelastine  (ASTELIN ) 0.1 % nasal spray Place  2 sprays into both nostrils 2 (two) times daily. Use in each nostril as directed 30 mL 5   Blood Glucose Monitoring Suppl DEVI 1 each by Does not apply route in the morning, at noon, and at bedtime. May substitute to any manufacturer covered by patient's insurance. 1 each 0   calcium  carbonate (OS-CAL) 600 MG TABS Take 600 mg by mouth daily.     cetirizine (ZYRTEC) 10 MG tablet Take 10 mg by mouth daily.     Cyanocobalamin (B12 FAST DISSOLVE PO) Take by mouth.     Insulin  Pen Needle (PEN NEEDLES) 32G X 4 MM MISC Use with Saxenda  once daily 100 each 0   Lancets (FREESTYLE) lancets Use to check sugar once a day.  Dx Code: E16.2 100 each 1   mupirocin  ointment (BACTROBAN ) 2 % Apply 1 Application topically 2 (two) times daily. 22 g 1   Pediatric Multiple Vitamins (FLINTSTONES MULTIVITAMIN PO) Take 2 tablets by mouth.     metoprolol  succinate (TOPROL -XL) 25 MG 24 hr tablet Take 1 tablet (25 mg total) by mouth daily. Take with or immediately following a meal 90 tablet 1   omeprazole  (PRILOSEC) 40 MG capsule TAKE 1 CAPSULE(40 MG) BY MOUTH DAILY 90 capsule 1   tirzepatide  (MOUNJARO ) 12.5 MG/0.5ML Pen Inject 12.5 mg into the skin once a week. 6 mL 1   No facility-administered medications prior to visit.    Allergies[1]  Review of Systems  Constitutional:  Negative for fever and malaise/fatigue.  HENT:  Negative for congestion.   Eyes:  Negative for blurred vision.  Respiratory:  Negative for shortness of breath.   Cardiovascular:  Negative for chest pain, palpitations and leg swelling.  Gastrointestinal:  Negative for abdominal  pain, blood in stool and nausea.  Genitourinary:  Negative for dysuria and frequency.  Musculoskeletal:  Negative for falls.  Skin:  Negative for rash.  Neurological:  Negative for dizziness, loss of consciousness and headaches.  Endo/Heme/Allergies:  Negative for environmental allergies.  Psychiatric/Behavioral:  Negative for depression. The patient is not nervous/anxious.        Objective:    Physical Exam Constitutional:      General: She is not in acute distress.    Appearance: Normal appearance. She is well-developed. She is not toxic-appearing.  HENT:     Head: Normocephalic and atraumatic.     Right Ear: External ear normal.     Left Ear: External ear normal.     Nose: Nose normal.  Eyes:     General:        Right eye: No discharge.        Left eye: No discharge.     Conjunctiva/sclera: Conjunctivae normal.  Neck:     Thyroid : No thyromegaly.  Cardiovascular:     Rate and Rhythm: Normal rate and regular rhythm.     Heart sounds: Normal heart sounds. No murmur heard. Pulmonary:     Effort: Pulmonary effort is normal. No respiratory distress.     Breath sounds: Normal breath sounds.  Abdominal:     General: Bowel sounds are normal.     Palpations: Abdomen is soft.     Tenderness: There is no abdominal tenderness. There is no guarding.  Musculoskeletal:        General: Normal range of motion.     Cervical back: Neck supple.  Lymphadenopathy:     Cervical: No cervical adenopathy.  Skin:    General: Skin is warm and dry.  Neurological:  Mental Status: She is alert and oriented to person, place, and time.  Psychiatric:        Mood and Affect: Mood normal.        Behavior: Behavior normal.        Thought Content: Thought content normal.        Judgment: Judgment normal.     BP 124/86   Pulse 80   Temp 98.1 F (36.7 C)   Resp 16   Ht 4' 11 (1.499 m)   Wt 152 lb 9.6 oz (69.2 kg)   LMP 05/27/2020   SpO2 98%   BMI 30.82 kg/m  Wt Readings from Last 3  Encounters:  09/29/24 152 lb 9.6 oz (69.2 kg)  05/18/24 159 lb (72.1 kg)  06/02/23 167 lb 12.8 oz (76.1 kg)    Diabetic Foot Exam - Simple   No data filed    Lab Results  Component Value Date   WBC 7.7 09/29/2024   HGB 12.6 09/29/2024   HCT 37.0 09/29/2024   PLT 312.0 09/29/2024   GLUCOSE 80 09/29/2024   CHOL 235 (H) 09/29/2024   TRIG 160.0 (H) 09/29/2024   HDL 57.10 09/29/2024   LDLDIRECT 164.0 11/24/2022   LDLCALC 146 (H) 09/29/2024   ALT 15 09/29/2024   AST 15 09/29/2024   NA 138 09/29/2024   K 3.8 09/29/2024   CL 101 09/29/2024   CREATININE 1.01 09/29/2024   BUN 16 09/29/2024   CO2 30 09/29/2024   TSH 1.95 09/29/2024   HGBA1C 5.4 09/29/2024   MICROALBUR <0.7 09/29/2024    Lab Results  Component Value Date   TSH 1.95 09/29/2024   Lab Results  Component Value Date   WBC 7.7 09/29/2024   HGB 12.6 09/29/2024   HCT 37.0 09/29/2024   MCV 84.6 09/29/2024   PLT 312.0 09/29/2024   Lab Results  Component Value Date   NA 138 09/29/2024   K 3.8 09/29/2024   CO2 30 09/29/2024   GLUCOSE 80 09/29/2024   BUN 16 09/29/2024   CREATININE 1.01 09/29/2024   BILITOT 0.4 09/29/2024   ALKPHOS 48 09/29/2024   AST 15 09/29/2024   ALT 15 09/29/2024   PROT 7.2 09/29/2024   ALBUMIN 4.5 09/29/2024   CALCIUM  9.4 09/29/2024   EGFR 86 05/18/2024   GFR 65.78 09/29/2024   Lab Results  Component Value Date   CHOL 235 (H) 09/29/2024   Lab Results  Component Value Date   HDL 57.10 09/29/2024   Lab Results  Component Value Date   LDLCALC 146 (H) 09/29/2024   Lab Results  Component Value Date   TRIG 160.0 (H) 09/29/2024   Lab Results  Component Value Date   CHOLHDL 4 09/29/2024   Lab Results  Component Value Date   HGBA1C 5.4 09/29/2024       Assessment & Plan:  Preventative health care Assessment & Plan: Patient encouraged to maintain heart healthy diet, regular exercise, adequate sleep. Consider daily probiotics. Take medications as prescribed. Labs ordered  and reviewed. Needs repeat diagnostic MGM 06/2024 repeat annually.  Cologuard 06/2022 repeat in 2026.s/p TAH with OB/GYN consider pelvic exam every 5 years or so  Orders: -     Comprehensive metabolic panel with GFR; Future -     CBC with Differential/Platelet; Future -     Lipid panel; Future -     TSH; Future -     Microalbumin / creatinine urine ratio; Future -     Hemoglobin A1c; Future  New  onset type 2 diabetes mellitus (HCC) Assessment & Plan: hgba1c acceptable, minimize simple carbs. Increase exercise as tolerated. Increase Mounjaro  to 5 mg weekly and continue to monitor   Obesity due to excess calories without serious comorbidity, unspecified class Assessment & Plan: Encouraged DASH or MIND diet, decrease po intake and increase exercise as tolerated. Needs 7-8 hours of sleep nightly. Avoid trans fats, eat small, frequent meals every 4-5 hours with lean proteins, complex carbs and healthy fats. Minimize simple carbs, high fat foods and processed foods    Hyperlipidemia, mixed Assessment & Plan: Encourage heart healthy diet such as MIND or DASH diet, increase exercise, avoid trans fats, simple carbohydrates and processed foods, consider a krill or fish or flaxseed oil cap daily.    Orders: -     Lipid panel; Future -     TSH; Future  Diabetes mellitus without complication (HCC) Assessment & Plan: hgba1c acceptable, minimize simple carbs. Increase exercise as tolerated. Continue current meds   Orders: -     Comprehensive metabolic panel with GFR; Future -     CBC with Differential/Platelet; Future -     Microalbumin / creatinine urine ratio; Future -     Hemoglobin A1c; Future  Leg cramp Assessment & Plan: Hydrate and monitor    Other orders -     Tirzepatide ; Inject 12.5 mg into the skin once a week.  Dispense: 6 mL; Refill: 5 -     Omeprazole ; Take 1 capsule (40 mg total) by mouth daily.  Dispense: 90 capsule; Refill: 1 -     Metoprolol  Succinate ER; Take 1  tablet (25 mg total) by mouth daily. Take with or immediately following a meal  Dispense: 90 tablet; Refill: 1    Assessment and Plan Assessment & Plan Type 2 diabetes mellitus Managed with Tirzepatide  (Mounjaro ) 12 mg weekly. Blood glucose levels are well-controlled with fasting levels consistently below 122 mg/dL. No new symptoms reported. Discussion about the impact of dietary choices on gastrointestinal symptoms, particularly with high-carbohydrate foods. - Continue Tirzepatide  (Mounjaro ) 12 mg weekly. - Ordered fasting blood work for future appointment.  Obesity due to excess calories Obesity managed with Tirzepatide  (Mounjaro ) 12 mg weekly. She reports satisfaction with current dose and no desire to increase to 15 mg. Discussion about dietary habits and the impact of high-carbohydrate foods on gastrointestinal symptoms. - Continue Tirzepatide  (Mounjaro ) 12 mg weekly. - Encouraged dietary modifications to reduce carbohydrate intake.  Gastroesophageal reflux disease GERD managed with Omeprazole  40 mg daily. Reports that skipping doses leads to worsening symptoms. - Continue Omeprazole  40 mg daily.  Secondary hypertension Managed with Metoprolol  succinate 25 mg daily. No new symptoms reported. - Continue Metoprolol  succinate 25 mg daily.  Recording duration: 31 minutes     Harlene Horton, MD    [1]  Allergies Allergen Reactions   Corticosteroids Anaphylaxis   Covid-19 (Mrna) Vaccine Swelling   Losartan  Swelling   Egg Protein-Containing Drug Products    Latex    Metformin  And Related Nausea And Vomiting   Penicillins     REACTION: SWELLING   Sulfa Antibiotics Itching   Lisinopril  Swelling   "

## 2024-09-25 NOTE — Assessment & Plan Note (Signed)
 hgba1c acceptable, minimize simple carbs. Increase exercise as tolerated. Increase Mounjaro  to 5 mg weekly and continue to monitor

## 2024-09-25 NOTE — Assessment & Plan Note (Signed)
 Her BMI today is 46. Encouraged ongoing weight-loss efforts, as even modest reductions can significantly improve overall health. Encouraged DASH or MIND diet, decrease po intake and increase exercise as tolerated. Needs 7-8 hours of sleep nightly. Avoid trans fats, eat small, frequent meals every 4-5 hours with lean proteins, complex carbs and healthy fats. Minimize simple carbs, high fat foods and processed foods

## 2024-09-25 NOTE — Assessment & Plan Note (Signed)
 hgba1c acceptable, minimize simple carbs. Increase exercise as tolerated. Continue current meds

## 2024-09-25 NOTE — Assessment & Plan Note (Signed)
 Hydrate and monitor

## 2024-09-25 NOTE — Assessment & Plan Note (Signed)
 Patient encouraged to maintain heart healthy diet, regular exercise, adequate sleep. Consider daily probiotics. Take medications as prescribed. Labs ordered and reviewed. Needs repeat diagnostic MGM 06/2024 repeat annually.  Cologuard 06/2022 repeat in 2026.s/p TAH with OB/GYN consider pelvic exam every 5 years or so

## 2024-09-29 ENCOUNTER — Encounter: Payer: Self-pay | Admitting: Family Medicine

## 2024-09-29 ENCOUNTER — Encounter: Admitting: Family Medicine

## 2024-09-29 ENCOUNTER — Ambulatory Visit: Admitting: Family Medicine

## 2024-09-29 VITALS — BP 124/86 | HR 80 | Temp 98.1°F | Resp 16 | Ht 59.0 in | Wt 152.6 lb

## 2024-09-29 DIAGNOSIS — Z7985 Long-term (current) use of injectable non-insulin antidiabetic drugs: Secondary | ICD-10-CM | POA: Diagnosis not present

## 2024-09-29 DIAGNOSIS — Z0000 Encounter for general adult medical examination without abnormal findings: Secondary | ICD-10-CM | POA: Diagnosis not present

## 2024-09-29 DIAGNOSIS — R252 Cramp and spasm: Secondary | ICD-10-CM

## 2024-09-29 DIAGNOSIS — E119 Type 2 diabetes mellitus without complications: Secondary | ICD-10-CM

## 2024-09-29 DIAGNOSIS — Z6830 Body mass index (BMI) 30.0-30.9, adult: Secondary | ICD-10-CM | POA: Diagnosis not present

## 2024-09-29 DIAGNOSIS — E6609 Other obesity due to excess calories: Secondary | ICD-10-CM

## 2024-09-29 DIAGNOSIS — E782 Mixed hyperlipidemia: Secondary | ICD-10-CM | POA: Diagnosis not present

## 2024-09-29 MED ORDER — METOPROLOL SUCCINATE ER 25 MG PO TB24: 25.0000 mg | ORAL_TABLET | Freq: Every day | ORAL | 1 refills | Status: AC

## 2024-09-29 MED ORDER — TIRZEPATIDE 12.5 MG/0.5ML ~~LOC~~ SOAJ: 12.5000 mg | SUBCUTANEOUS | 5 refills | Status: AC

## 2024-09-29 MED ORDER — OMEPRAZOLE 40 MG PO CPDR: 40.0000 mg | DELAYED_RELEASE_CAPSULE | Freq: Every day | ORAL | 1 refills | Status: AC

## 2024-09-29 MED ORDER — METOPROLOL SUCCINATE ER 25 MG PO TB24: 25.0000 mg | ORAL_TABLET | Freq: Every day | ORAL | 1 refills | Status: DC

## 2024-09-29 NOTE — Patient Instructions (Addendum)
 Veronica Seip PA Dr Sherlynn geriatrician at [facility] 74-49 Years Old, Female Preventive care refers to lifestyle choices and visits with your health care provider that can promote health and wellness. Preventive care visits are also called wellness exams. What can I expect for my preventive care visit? Counseling Your health care provider may ask you questions about your: Medical history, including: Past medical problems. Family medical history. Pregnancy history. Current health, including: Menstrual cycle. Method of birth control. Emotional well-being. Home life and relationship well-being. Sexual activity and sexual health. Lifestyle, including: Alcohol, nicotine or tobacco, and drug use. Access to firearms. Diet, exercise, and sleep habits. Work and work astronomer. Sunscreen use. Safety issues such as seatbelt and bike helmet use. Physical exam Your health care provider will check your: Height and weight. These may be used to calculate your BMI (body mass index). BMI is a measurement that tells if you are at a healthy weight. Waist circumference. This measures the distance around your waistline. This measurement also tells if you are at a healthy weight and may help predict your risk of certain diseases, such as type 2 diabetes and high blood pressure. Heart rate and blood pressure. Body temperature. Skin for abnormal spots. What immunizations do I need?  Vaccines are usually given at various ages, according to a schedule. Your health care provider will recommend vaccines for you based on your age, medical history, and lifestyle or other factors, such as travel or where you work. What tests do I need? Screening Your health care provider may recommend screening tests for certain conditions. This may include: Lipid and cholesterol levels. Diabetes screening. This is done by checking your blood sugar (glucose) after you have not eaten for a while  (fasting). Pelvic exam and Pap test. Hepatitis B test. Hepatitis C test. HIV (human immunodeficiency virus) test. STI (sexually transmitted infection) testing, if you are at risk. Lung cancer screening. Colorectal cancer screening. Mammogram. Talk with your health care provider about when you should start having regular mammograms. This may depend on whether you have a family history of breast cancer. BRCA-related cancer screening. This may be done if you have a family history of breast, ovarian, tubal, or peritoneal cancers. Bone density scan. This is done to screen for osteoporosis. Talk with your health care provider about your test results, treatment options, and if necessary, the need for more tests. Follow these instructions at home: Eating and drinking  Eat a diet that includes fresh fruits and vegetables, whole grains, lean protein, and low-fat dairy products. Take vitamin and mineral supplements as recommended by your health care provider. Do not drink alcohol if: Your health care provider tells you not to drink. You are pregnant, may be pregnant, or are planning to become pregnant. If you drink alcohol: Limit how much you have to 0-1 drink a day. Know how much alcohol is in your drink. In the [place] one drink equals one 12 oz bottle of beer (355 mL), one 5 oz glass of wine (148 mL), or one 1 oz glass of hard liquor (44 mL). Lifestyle Brush your teeth every morning and night with fluoride toothpaste. Floss one time each day. Exercise for at least 30 minutes 5 or more days each week. Do not use any products that contain nicotine or tobacco. These products include cigarettes, chewing tobacco, and vaping devices, such as e-cigarettes. If you need help quitting, ask your health care provider. Do not use drugs. If you are sexually active, practice safe  sex. Use a condom or other form of protection to prevent STIs. If you do not wish to become pregnant, use a form of birth control. If  you plan to become pregnant, see your health care provider for a prepregnancy visit. Take aspirin only as told by your health care provider. Make sure that you understand how much to take and what form to take. Work with your health care provider to find out whether it is safe and beneficial for you to take aspirin daily. Find healthy ways to manage stress, such as: Meditation, yoga, or listening to music. Journaling. Talking to a trusted person. Spending time with friends and family. Minimize exposure to UV radiation to reduce your risk of skin cancer. Safety Always wear your seat belt while driving or riding in a vehicle. Do not drive: If you have been drinking alcohol. Do not ride with someone who has been drinking. When you are tired or distracted. While texting. If you have been using any mind-altering substances or drugs. Wear a helmet and other protective equipment during sports activities. If you have firearms in your house, make sure you follow all gun safety procedures. Seek help if you have been physically or sexually abused. What's next? Visit your health care provider once a year for an annual wellness visit. Ask your health care provider how often you should have your eyes and teeth checked. Stay up to date on all vaccines. This information is not intended to replace advice given to you by your health care provider. Make sure you discuss any questions you have with your health care provider. Document Revised: 02/20/2021 Document Reviewed: 02/20/2021 Elsevier Patient Education  2024 [employer].

## 2024-09-30 ENCOUNTER — Encounter: Payer: Self-pay | Admitting: Family Medicine

## 2024-09-30 ENCOUNTER — Ambulatory Visit: Payer: Self-pay | Admitting: Family Medicine

## 2024-09-30 DIAGNOSIS — E782 Mixed hyperlipidemia: Secondary | ICD-10-CM

## 2024-09-30 LAB — COMPREHENSIVE METABOLIC PANEL WITH GFR
ALT: 15 U/L (ref 3–35)
AST: 15 U/L (ref 5–37)
Albumin: 4.5 g/dL (ref 3.5–5.2)
Alkaline Phosphatase: 48 U/L (ref 39–117)
BUN: 16 mg/dL (ref 6–23)
CO2: 30 meq/L (ref 19–32)
Calcium: 9.4 mg/dL (ref 8.4–10.5)
Chloride: 101 meq/L (ref 96–112)
Creatinine, Ser: 1.01 mg/dL (ref 0.40–1.20)
GFR: 65.78 mL/min
Glucose, Bld: 80 mg/dL (ref 70–99)
Potassium: 3.8 meq/L (ref 3.5–5.1)
Sodium: 138 meq/L (ref 135–145)
Total Bilirubin: 0.4 mg/dL (ref 0.2–1.2)
Total Protein: 7.2 g/dL (ref 6.0–8.3)

## 2024-09-30 LAB — LIPID PANEL
Cholesterol: 235 mg/dL — ABNORMAL HIGH (ref 28–200)
HDL: 57.1 mg/dL
LDL Cholesterol: 146 mg/dL — ABNORMAL HIGH (ref 10–99)
NonHDL: 177.68
Total CHOL/HDL Ratio: 4
Triglycerides: 160 mg/dL — ABNORMAL HIGH (ref 10.0–149.0)
VLDL: 32 mg/dL (ref 0.0–40.0)

## 2024-09-30 LAB — CBC WITH DIFFERENTIAL/PLATELET
Basophils Absolute: 0.1 K/uL (ref 0.0–0.1)
Basophils Relative: 1.3 % (ref 0.0–3.0)
Eosinophils Absolute: 0.3 K/uL (ref 0.0–0.7)
Eosinophils Relative: 3.6 % (ref 0.0–5.0)
HCT: 37 % (ref 36.0–46.0)
Hemoglobin: 12.6 g/dL (ref 12.0–15.0)
Lymphocytes Relative: 26.1 % (ref 12.0–46.0)
Lymphs Abs: 2 K/uL (ref 0.7–4.0)
MCHC: 34 g/dL (ref 30.0–36.0)
MCV: 84.6 fl (ref 78.0–100.0)
Monocytes Absolute: 0.6 K/uL (ref 0.1–1.0)
Monocytes Relative: 7.5 % (ref 3.0–12.0)
Neutro Abs: 4.8 K/uL (ref 1.4–7.7)
Neutrophils Relative %: 61.5 % (ref 43.0–77.0)
Platelets: 312 K/uL (ref 150.0–400.0)
RBC: 4.38 Mil/uL (ref 3.87–5.11)
RDW: 13.4 % (ref 11.5–15.5)
WBC: 7.7 K/uL (ref 4.0–10.5)

## 2024-09-30 LAB — TSH: TSH: 1.95 u[IU]/mL (ref 0.35–5.50)

## 2024-09-30 LAB — MICROALBUMIN / CREATININE URINE RATIO
Creatinine,U: 147.4 mg/dL
Microalb Creat Ratio: UNDETERMINED mg/g (ref 0.0–30.0)
Microalb, Ur: <0.7 mg/dL

## 2024-09-30 LAB — HEMOGLOBIN A1C: Hgb A1c MFr Bld: 5.4 % (ref 4.6–6.5)

## 2024-09-30 NOTE — Progress Notes (Signed)
 Pt reviewed via MyChart. Called pt to see which pharmacy to send the medication to and no answer, left message to return call.

## 2024-10-04 MED ORDER — ATORVASTATIN CALCIUM 10 MG PO TABS: 10.0000 mg | ORAL_TABLET | Freq: Every day | ORAL | 3 refills | Status: AC

## 2024-10-07 ENCOUNTER — Telehealth: Payer: Self-pay

## 2024-10-07 NOTE — Telephone Encounter (Signed)
 Communication  Pt is requesting to transfer FROM: Veronica Stokes    Pt is requesting to transfer TO: Philip McGowen    Reason for requested transfer: provider leaving  It is the responsibility of the team the patient would like to transfer to (Dr. Aleene Hockey) to reach out to the patient if for any reason this transfer is not acceptable.   FYI. Pt is scheduled for 3/13.

## 2024-11-16 ENCOUNTER — Ambulatory Visit: Admitting: Internal Medicine

## 2024-11-18 ENCOUNTER — Encounter: Admitting: Family Medicine
